# Patient Record
Sex: Female | Born: 1995 | Race: White | Hispanic: No | Marital: Single | Smoking: Never smoker
Health system: Southern US, Community
[De-identification: ages and names within clinical notes are randomized; demographics above are authoritative.]

## PROBLEM LIST (undated history)

## (undated) DIAGNOSIS — B2790 Infectious mononucleosis, unspecified without complication: Secondary | ICD-10-CM

## (undated) DIAGNOSIS — K353 Acute appendicitis with localized peritonitis, without perforation or gangrene: Secondary | ICD-10-CM

## (undated) DIAGNOSIS — Z8619 Personal history of other infectious and parasitic diseases: Secondary | ICD-10-CM

## (undated) DIAGNOSIS — J351 Hypertrophy of tonsils: Secondary | ICD-10-CM

## (undated) DIAGNOSIS — F988 Other specified behavioral and emotional disorders with onset usually occurring in childhood and adolescence: Secondary | ICD-10-CM

## (undated) DIAGNOSIS — N926 Irregular menstruation, unspecified: Secondary | ICD-10-CM

## (undated) DIAGNOSIS — Z8489 Family history of other specified conditions: Secondary | ICD-10-CM

## (undated) DIAGNOSIS — M2140 Flat foot [pes planus] (acquired), unspecified foot: Secondary | ICD-10-CM

## (undated) DIAGNOSIS — S8990XA Unspecified injury of unspecified lower leg, initial encounter: Secondary | ICD-10-CM

## (undated) DIAGNOSIS — H5210 Myopia, unspecified eye: Secondary | ICD-10-CM

## (undated) HISTORY — PX: APPENDECTOMY: SHX54

## (undated) HISTORY — DX: Hypertrophy of tonsils: J35.1

## (undated) HISTORY — DX: Myopia, unspecified eye: H52.10

## (undated) HISTORY — DX: Infectious mononucleosis, unspecified without complication: B27.90

## (undated) HISTORY — PX: MYRINGOTOMY: SHX2060

## (undated) HISTORY — DX: Other specified behavioral and emotional disorders with onset usually occurring in childhood and adolescence: F98.8

## (undated) HISTORY — DX: Acute appendicitis with localized peritonitis, without perforation or gangrene: K35.30

## (undated) HISTORY — DX: Flat foot (pes planus) (acquired), unspecified foot: M21.40

## (undated) HISTORY — DX: Irregular menstruation, unspecified: N92.6

## (undated) HISTORY — DX: Personal history of other infectious and parasitic diseases: Z86.19

## (undated) HISTORY — DX: Unspecified injury of unspecified lower leg, initial encounter: S89.90XA

---
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
## 2017-01-08 DIAGNOSIS — E860 Dehydration: Secondary | ICD-10-CM | POA: Diagnosis not present

## 2017-01-08 DIAGNOSIS — R05 Cough: Secondary | ICD-10-CM | POA: Diagnosis not present

## 2017-01-28 ENCOUNTER — Telehealth: Payer: Self-pay | Admitting: Family Medicine

## 2017-01-28 MED ORDER — DROSPIRENONE-ETHINYL ESTRADIOL 3-0.02 MG PO TABS: 1.0000 | ORAL_TABLET | Freq: Every day | ORAL | 0 refills | Status: DC

## 2017-01-28 NOTE — Telephone Encounter (Signed)
Spoke with patient Father, requesting a refill of the patient's Yaz.  Requests this be sent to [facility] in [place] (P#) [phone] Requests 90 day supply. This has been refilled as requested.  Nothing further needed.

## 2017-02-04 ENCOUNTER — Ambulatory Visit (INDEPENDENT_AMBULATORY_CARE_PROVIDER_SITE_OTHER): Payer: 59 | Admitting: Family Medicine

## 2017-02-04 DIAGNOSIS — J020 Streptococcal pharyngitis: Secondary | ICD-10-CM

## 2017-02-04 MED ORDER — METHYLPREDNISOLONE ACETATE 80 MG/ML IJ SUSP
80.0000 mg | Freq: Once | INTRAMUSCULAR | Status: AC
  Administered 2017-02-04: 80 mg via INTRAMUSCULAR

## 2017-02-04 MED ORDER — CEFDINIR 300 MG PO CAPS: 300.0000 mg | ORAL_CAPSULE | Freq: Two times a day (BID) | ORAL | 0 refills | Status: AC

## 2017-02-04 MED ORDER — ONDANSETRON HCL 4 MG PO TABS: 4.0000 mg | ORAL_TABLET | Freq: Three times a day (TID) | ORAL | 0 refills | Status: DC | PRN

## 2017-02-04 NOTE — Patient Instructions (Signed)
Strep Throat Strep throat is a bacterial infection of the throat. Your health care provider may call the infection tonsillitis or pharyngitis, depending on whether there is swelling in the tonsils or at the back of the throat. Strep throat is most common during the cold months of the year in children who are 5-19 years of age, but it can happen during any season in people of any age. This infection is spread from person to person (contagious) through coughing, sneezing, or close contact. What are the causes? Strep throat is caused by the bacteria called Streptococcus pyogenes. What increases the risk? This condition is more likely to develop in:  People who spend time in crowded places where the infection can spread easily.  People who have close contact with someone who has strep throat.  What are the signs or symptoms? Symptoms of this condition include:  Fever or chills.  Redness, swelling, or pain in the tonsils or throat.  Pain or difficulty when swallowing.  White or yellow spots on the tonsils or throat.  Swollen, tender glands in the neck or under the jaw.  Red rash all over the body (rare).  How is this diagnosed? This condition is diagnosed by performing a rapid strep test or by taking a swab of your throat (throat culture test). Results from a rapid strep test are usually ready in a few minutes, but throat culture test results are available after one or two days. How is this treated? This condition is treated with antibiotic medicine. Follow these instructions at home: Medicines  Take over-the-counter and prescription medicines only as told by your health care provider.  Take your antibiotic as told by your health care provider. Do not stop taking the antibiotic even if you start to feel better.  Have family members who also have a sore throat or fever tested for strep throat. They may need antibiotics if they have the strep infection. Eating and drinking  Do not  share food, drinking cups, or personal items that could cause the infection to spread to other people.  If swallowing is difficult, try eating soft foods until your sore throat feels better.  Drink enough fluid to keep your urine clear or pale yellow. General instructions  Gargle with a salt-water mixture 3-4 times per day or as needed. To make a salt-water mixture, completely dissolve -1 tsp of salt in 1 cup of warm water.  Make sure that all household members wash their hands well.  Get plenty of rest.  Stay home from school or work until you have been taking antibiotics for 24 hours.  Keep all follow-up visits as told by your health care provider. This is important. Contact a health care provider if:  The glands in your neck continue to get bigger.  You develop a rash, cough, or earache.  You cough up a thick liquid that is green, yellow-brown, or bloody.  You have pain or discomfort that does not get better with medicine.  Your problems seem to be getting worse rather than better.  You have a fever. Get help right away if:  You have new symptoms, such as vomiting, severe headache, stiff or painful neck, chest pain, or shortness of breath.  You have severe throat pain, drooling, or changes in your voice.  You have swelling of the neck, or the skin on the neck becomes red and tender.  You have signs of dehydration, such as fatigue, dry mouth, and decreased urination.  You become increasingly sleepy, or   you cannot wake up completely.  Your joints become red or painful. This information is not intended to replace advice given to you by your health care provider. Make sure you discuss any questions you have with your health care provider. Document Released: 03/01/2000 Document Revised: 11/01/2015 Document Reviewed: 06/27/2014 Elsevier Interactive Patient Education  2017 [employer]  

## 2017-02-04 NOTE — Progress Notes (Signed)
[name] is a 21 y.o. female presenting with a sore throat for 2-3 days.  Associated symptoms include:  nausea and cough.  Symptoms are constant.  Home treatment thus far includes:  rest, hydration, NSAIDS/acetaminophen and prescription meds Keflex x 3 doses.  No known sick contacts with similar symptoms.    There is a previous history of of similar symptoms.  Pt has been seen by ENT in the past.  H/o 5 episodes of strep in one yr.  Exam:  There were no vitals taken for this visit. Gen. Pleasant, well developed, well-nourished, in NAD HEENT - [place]/AT, PERRL, conjunctive clear, no scleral icterus, mild clear nasal drainage, pharynx with erythema and exudate. Lungs: no use of accessory muscles, no dullness to percussion, CTAB, no wheezes, rales or rhonchi Cardiovascular: RRR,  No r/g/m, no peripheral edema Abdomen: BS present, soft, nontender,nondistended Skin:  Warm, dry, intact, no lesions   A/P: -Depo-Medrol 80 mg/mL injection -We will send an Rx for cefdinir 300 mg twice daily times 10 days -Given handout on above -Suggest follow-up with ENT for tonsillectomy.

## 2017-02-18 ENCOUNTER — Telehealth: Payer: Self-pay | Admitting: Family Medicine

## 2017-02-18 NOTE — Telephone Encounter (Signed)
Since she has had 5 episodes of strep throat in the past year, she needs a tonsillectomy. Please refer her to see Dr. Melissa Montane urgently about this. She gets back to town from college on Dec. 14.

## 2017-02-18 NOTE — Telephone Encounter (Signed)
Pt going to see Dr Janace Hoard ENT, no referral needed.

## 2017-02-28 ENCOUNTER — Other Ambulatory Visit: Payer: Self-pay | Admitting: Adult Health

## 2017-02-28 ENCOUNTER — Telehealth: Payer: Self-pay | Admitting: Family Medicine

## 2017-02-28 MED ORDER — DROSPIRENONE-ETHINYL ESTRADIOL 3-0.02 MG PO TABS: 1.0000 | ORAL_TABLET | Freq: Every day | ORAL | 0 refills | Status: DC

## 2017-02-28 NOTE — Telephone Encounter (Signed)
Please call in 4 packs of Yaz to Lisa Hines (she takes this continuously)

## 2017-03-03 DIAGNOSIS — J039 Acute tonsillitis, unspecified: Secondary | ICD-10-CM | POA: Diagnosis not present

## 2017-03-04 DIAGNOSIS — Z113 Encounter for screening for infections with a predominantly sexual mode of transmission: Secondary | ICD-10-CM | POA: Diagnosis not present

## 2017-03-04 DIAGNOSIS — Z3041 Encounter for surveillance of contraceptive pills: Secondary | ICD-10-CM | POA: Diagnosis not present

## 2017-03-05 ENCOUNTER — Ambulatory Visit (INDEPENDENT_AMBULATORY_CARE_PROVIDER_SITE_OTHER): Payer: 59 | Admitting: *Deleted

## 2017-03-05 DIAGNOSIS — Z23 Encounter for immunization: Secondary | ICD-10-CM | POA: Diagnosis not present

## 2017-03-05 MED FILL — DROSPIR-ETH ESTRA 3/.02 MG: 3-0.02 | 84 days supply | Qty: 112 | Fill #0

## 2017-03-12 ENCOUNTER — Other Ambulatory Visit: Payer: Self-pay

## 2017-03-12 ENCOUNTER — Encounter (HOSPITAL_BASED_OUTPATIENT_CLINIC_OR_DEPARTMENT_OTHER): Payer: Self-pay | Admitting: *Deleted

## 2017-03-12 ENCOUNTER — Encounter (HOSPITAL_BASED_OUTPATIENT_CLINIC_OR_DEPARTMENT_OTHER)
Admission: RE | Admit: 2017-03-12 | Discharge: 2017-03-12 | Disposition: A | Discharge status: Home / Self Care | Payer: 59 | Source: Ambulatory Visit | Attending: Otolaryngology | Admitting: Otolaryngology

## 2017-03-12 DIAGNOSIS — J039 Acute tonsillitis, unspecified: Secondary | ICD-10-CM | POA: Insufficient documentation

## 2017-03-12 DIAGNOSIS — Z01812 Encounter for preprocedural laboratory examination: Secondary | ICD-10-CM | POA: Diagnosis not present

## 2017-03-13 LAB — POCT PREGNANCY, URINE: PREG TEST UR: NEGATIVE

## 2017-03-17 ENCOUNTER — Other Ambulatory Visit: Payer: Self-pay | Admitting: Otolaryngology

## 2017-03-19 ENCOUNTER — Other Ambulatory Visit: Payer: Self-pay

## 2017-03-19 ENCOUNTER — Encounter (HOSPITAL_BASED_OUTPATIENT_CLINIC_OR_DEPARTMENT_OTHER): Payer: Self-pay | Admitting: *Deleted

## 2017-03-19 ENCOUNTER — Ambulatory Visit (HOSPITAL_BASED_OUTPATIENT_CLINIC_OR_DEPARTMENT_OTHER)
Admission: RE | Admit: 2017-03-19 | Discharge: 2017-03-19 | Disposition: A | Discharge status: Home / Self Care | Payer: 59 | Source: Ambulatory Visit | Attending: Otolaryngology | Admitting: Otolaryngology

## 2017-03-19 ENCOUNTER — Ambulatory Visit (HOSPITAL_BASED_OUTPATIENT_CLINIC_OR_DEPARTMENT_OTHER): Payer: 59 | Admitting: Anesthesiology

## 2017-03-19 ENCOUNTER — Encounter (HOSPITAL_BASED_OUTPATIENT_CLINIC_OR_DEPARTMENT_OTHER)
Admission: RE | Disposition: A | Discharge status: Home / Self Care | Payer: Self-pay | Source: Ambulatory Visit | Attending: Otolaryngology

## 2017-03-19 DIAGNOSIS — F988 Other specified behavioral and emotional disorders with onset usually occurring in childhood and adolescence: Secondary | ICD-10-CM | POA: Diagnosis not present

## 2017-03-19 DIAGNOSIS — J351 Hypertrophy of tonsils: Secondary | ICD-10-CM | POA: Diagnosis not present

## 2017-03-19 DIAGNOSIS — Q6650 Congenital pes planus, unspecified foot: Secondary | ICD-10-CM | POA: Diagnosis not present

## 2017-03-19 DIAGNOSIS — Z880 Allergy status to penicillin: Secondary | ICD-10-CM | POA: Insufficient documentation

## 2017-03-19 DIAGNOSIS — D649 Anemia, unspecified: Secondary | ICD-10-CM | POA: Diagnosis not present

## 2017-03-19 DIAGNOSIS — Z888 Allergy status to other drugs, medicaments and biological substances status: Secondary | ICD-10-CM | POA: Insufficient documentation

## 2017-03-19 DIAGNOSIS — J039 Acute tonsillitis, unspecified: Secondary | ICD-10-CM | POA: Diagnosis not present

## 2017-03-19 DIAGNOSIS — J3501 Chronic tonsillitis: Secondary | ICD-10-CM | POA: Diagnosis not present

## 2017-03-19 DIAGNOSIS — F909 Attention-deficit hyperactivity disorder, unspecified type: Secondary | ICD-10-CM | POA: Diagnosis not present

## 2017-03-19 HISTORY — PX: TONSILLECTOMY: SHX5217

## 2017-03-19 HISTORY — DX: Family history of other specified conditions: Z84.89

## 2017-03-19 SURGERY — TONSILLECTOMY
Anesthesia: General | Site: Throat | Laterality: Bilateral

## 2017-03-19 MED ORDER — OXYCODONE HCL 5 MG PO TABS: 5.0000 mg | ORAL_TABLET | Freq: Once | ORAL | Status: AC | PRN

## 2017-03-19 MED ORDER — OXYCODONE HCL 5 MG/5ML PO SOLN: 5.0000 mg | ORAL | 0 refills | Status: DC | PRN

## 2017-03-19 MED ORDER — PROMETHAZINE HCL 25 MG/ML IJ SOLN: 6.2500 mg | INTRAMUSCULAR | Status: DC | PRN

## 2017-03-19 MED ORDER — SCOPOLAMINE 1 MG/3DAYS TD PT72
1.0000 | MEDICATED_PATCH | Freq: Once | TRANSDERMAL | Status: DC | PRN
Start: 2017-03-19 — End: 2017-03-19

## 2017-03-19 MED ORDER — PROPOFOL 10 MG/ML IV BOLUS
INTRAVENOUS | Status: DC | PRN
  Administered 2017-03-19: 200 mg via INTRAVENOUS
  Administered 2017-03-19: 50 mg via INTRAVENOUS

## 2017-03-19 MED ORDER — ONDANSETRON HCL 4 MG/2ML IJ SOLN
INTRAMUSCULAR | Status: DC | PRN
  Administered 2017-03-19: 4 mg via INTRAVENOUS

## 2017-03-19 MED ORDER — FENTANYL CITRATE (PF) 100 MCG/2ML IJ SOLN
50.0000 ug | INTRAMUSCULAR | Status: DC | PRN
  Administered 2017-03-19 (×2): 50 ug via INTRAVENOUS

## 2017-03-19 MED ORDER — ONDANSETRON HCL 4 MG/2ML IJ SOLN
INTRAMUSCULAR | Status: AC
  Filled 2017-03-19: qty 2

## 2017-03-19 MED ORDER — HYDROMORPHONE HCL 1 MG/ML IJ SOLN
INTRAMUSCULAR | Status: AC
  Filled 2017-03-19: qty 0.5

## 2017-03-19 MED ORDER — MIDAZOLAM HCL 2 MG/2ML IJ SOLN
INTRAMUSCULAR | Status: AC
  Filled 2017-03-19: qty 2

## 2017-03-19 MED ORDER — SUCCINYLCHOLINE CHLORIDE 200 MG/10ML IV SOSY
PREFILLED_SYRINGE | INTRAVENOUS | Status: AC
  Filled 2017-03-19: qty 10

## 2017-03-19 MED ORDER — MIDAZOLAM HCL 2 MG/2ML IJ SOLN
1.0000 mg | INTRAMUSCULAR | Status: DC | PRN
  Administered 2017-03-19: 2 mg via INTRAVENOUS

## 2017-03-19 MED ORDER — PROPOFOL 10 MG/ML IV BOLUS
INTRAVENOUS | Status: AC
  Filled 2017-03-19: qty 20

## 2017-03-19 MED ORDER — LIDOCAINE 2% (20 MG/ML) 5 ML SYRINGE
INTRAMUSCULAR | Status: AC
  Filled 2017-03-19: qty 5

## 2017-03-19 MED ORDER — OXYCODONE HCL 5 MG/5ML PO SOLN
ORAL | Status: AC
  Filled 2017-03-19: qty 5

## 2017-03-19 MED ORDER — HYDROMORPHONE HCL 1 MG/ML IJ SOLN
0.2500 mg | INTRAMUSCULAR | Status: DC | PRN
  Administered 2017-03-19 (×2): 0.25 mg via INTRAVENOUS

## 2017-03-19 MED ORDER — FENTANYL CITRATE (PF) 100 MCG/2ML IJ SOLN
INTRAMUSCULAR | Status: AC
  Filled 2017-03-19: qty 2

## 2017-03-19 MED ORDER — SUCCINYLCHOLINE CHLORIDE 200 MG/10ML IV SOSY
PREFILLED_SYRINGE | INTRAVENOUS | Status: DC | PRN
  Administered 2017-03-19: 100 mg via INTRAVENOUS

## 2017-03-19 MED ORDER — DEXAMETHASONE SODIUM PHOSPHATE 10 MG/ML IJ SOLN
INTRAMUSCULAR | Status: AC
  Filled 2017-03-19: qty 1

## 2017-03-19 MED ORDER — DEXAMETHASONE SODIUM PHOSPHATE 10 MG/ML IJ SOLN
INTRAMUSCULAR | Status: DC | PRN
  Administered 2017-03-19: 10 mg via INTRAVENOUS

## 2017-03-19 MED ORDER — OXYCODONE HCL 5 MG/5ML PO SOLN
5.0000 mg | Freq: Once | ORAL | Status: AC | PRN
Start: 2017-03-19 — End: 2017-03-19
  Administered 2017-03-19: 5 mg via ORAL

## 2017-03-19 MED ORDER — LACTATED RINGERS IV SOLN
INTRAVENOUS | Status: DC
  Administered 2017-03-19 (×2): via INTRAVENOUS

## 2017-03-19 MED ORDER — MEPERIDINE HCL 25 MG/ML IJ SOLN: 6.2500 mg | INTRAMUSCULAR | Status: DC | PRN

## 2017-03-19 MED FILL — oxyCODONE HCL 5 MG/5ML SOLN: 5 | 8 days supply | Qty: 240 | Fill #0

## 2017-03-19 SURGICAL SUPPLY — 32 items
CANISTER SUCT 1200ML W/VALVE (MISCELLANEOUS) ×3 IMPLANT
CATH ROBINSON RED A/P 12FR (CATHETERS) IMPLANT
CLEANER CAUTERY TIP 5X5 PAD (MISCELLANEOUS) ×1 IMPLANT
COAGULATOR SUCT 6 FR SWTCH (ELECTROSURGICAL) ×1
COAGULATOR SUCT SWTCH 10FR 6 (ELECTROSURGICAL) ×2 IMPLANT
COVER BACK TABLE 60X90IN (DRAPES) ×3 IMPLANT
COVER MAYO STAND STRL (DRAPES) ×3 IMPLANT
ELECT COATED BLADE 2.86 ST (ELECTRODE) ×3 IMPLANT
ELECT REM PT RETURN 9FT ADLT (ELECTROSURGICAL) ×3
ELECT REM PT RETURN 9FT PED (ELECTROSURGICAL)
ELECTRODE REM PT RETRN 9FT PED (ELECTROSURGICAL) IMPLANT
ELECTRODE REM PT RTRN 9FT ADLT (ELECTROSURGICAL) ×1 IMPLANT
GAUZE SPONGE 4X4 12PLY STRL LF (GAUZE/BANDAGES/DRESSINGS) ×3 IMPLANT
GLOVE BIO SURGEON STRL SZ 6.5 (GLOVE) ×2 IMPLANT
GLOVE BIO SURGEONS STRL SZ 6.5 (GLOVE) ×1
GLOVE SS BIOGEL STRL SZ 7.5 (GLOVE) ×1 IMPLANT
GLOVE SUPERSENSE BIOGEL SZ 7.5 (GLOVE) ×2
GOWN STRL REUS W/ TWL LRG LVL3 (GOWN DISPOSABLE) ×2 IMPLANT
GOWN STRL REUS W/TWL LRG LVL3 (GOWN DISPOSABLE) ×4
MARKER SKIN DUAL TIP RULER LAB (MISCELLANEOUS) IMPLANT
NS IRRIG 1000ML POUR BTL (IV SOLUTION) ×3 IMPLANT
PAD CLEANER CAUTERY TIP 5X5 (MISCELLANEOUS) ×2
PENCIL FOOT CONTROL (ELECTRODE) ×3 IMPLANT
SHEET MEDIUM DRAPE 40X70 STRL (DRAPES) ×3 IMPLANT
SPONGE TONSIL 1 RF SGL (DISPOSABLE) IMPLANT
SPONGE TONSIL 1.25 RF SGL STRG (GAUZE/BANDAGES/DRESSINGS) IMPLANT
SYR BULB 3OZ (MISCELLANEOUS) ×3 IMPLANT
TOWEL OR 17X24 6PK STRL BLUE (TOWEL DISPOSABLE) ×3 IMPLANT
TUBE CONNECTING 20'X1/4 (TUBING) ×1
TUBE CONNECTING 20X1/4 (TUBING) ×2 IMPLANT
TUBE SALEM SUMP 12R W/ARV (TUBING) IMPLANT
TUBE SALEM SUMP 16 FR W/ARV (TUBING) ×3 IMPLANT

## 2017-03-19 NOTE — Anesthesia Preprocedure Evaluation (Signed)
Anesthesia Evaluation  Patient identified by MRN, date of birth, ID band Patient awake    Reviewed: Allergy & Precautions, NPO status , Patient's Chart, lab work & pertinent test results  Airway Mallampati: I  TM Distance: >3 FB Neck ROM: Full    Dental   Pulmonary neg pulmonary ROS,    Pulmonary exam normal        Cardiovascular negative cardio ROS Normal cardiovascular exam Rhythm:Regular     Neuro/Psych negative neurological ROS  negative psych ROS   GI/Hepatic negative GI ROS, Neg liver ROS,   Endo/Other  negative endocrine ROS  Renal/GU negative Renal ROS     Musculoskeletal negative musculoskeletal ROS (+)   Abdominal   Peds  Hematology  (+) anemia ,   Anesthesia Other Findings   Reproductive/Obstetrics negative OB ROS                             Anesthesia Physical  Anesthesia Plan  ASA: I  Anesthesia Plan: General   Post-op Pain Management:    Induction: Intravenous  PONV Risk Score and Plan: 4 or greater and Ondansetron, Dexamethasone, Scopolamine patch - Pre-op and Midazolam  Airway Management Planned: Oral ETT  Additional Equipment:   Intra-op Plan:   Post-operative Plan: Extubation in OR  Informed Consent: I have reviewed the patients History and Physical, chart, labs and discussed the procedure including the risks, benefits and alternatives for the proposed anesthesia with the patient or authorized representative who has indicated his/her understanding and acceptance.   Dental advisory given  Plan Discussed with: CRNA  Anesthesia Plan Comments:         Anesthesia Quick Evaluation

## 2017-03-19 NOTE — Discharge Instructions (Signed)

## 2017-03-19 NOTE — Transfer of Care (Signed)
Immediate Anesthesia Transfer of Care Note  Patient: SURIYAH VERGARA  Procedure(s) Performed: TONSILLECTOMY (Bilateral Throat)  Patient Location: PACU  Anesthesia Type:General  Level of Consciousness: awake and confused  Airway & Oxygen Therapy: Patient Spontanous Breathing and aerosol face mask  Post-op Assessment: Report given to RN and Post -op Vital signs reviewed and stable  Post vital signs: Reviewed and stable  Last Vitals:  Vitals:   03/19/17 0821 03/19/17 0937  BP: 120/74   Pulse: 84   Resp: 18   Temp: 36.7 C 36.7 C  SpO2: 99%     Last Pain:  Vitals:   03/19/17 0821  TempSrc: Oral      Patients Stated Pain Goal: 0 (43/[id number]/57 [id number])  Complications: No apparent anesthesia complications

## 2017-03-19 NOTE — Anesthesia Procedure Notes (Signed)
Procedure Name: Intubation Date/Time: 03/19/2017 8:59 AM Performed by: Lyndee Leo, CRNA Pre-anesthesia Checklist: Patient identified, Emergency Drugs available, Suction available and Patient being monitored Patient Re-evaluated:Patient Re-evaluated prior to induction Oxygen Delivery Method: Circle system utilized Preoxygenation: Pre-oxygenation with 100% oxygen Induction Type: IV induction Ventilation: Mask ventilation without difficulty Laryngoscope Size: Mac and 3 Grade View: Grade I Tube type: Oral Tube size: 6.5 mm Number of attempts: 1 Airway Equipment and Method: Stylet and Oral airway Placement Confirmation: ETT inserted through vocal cords under direct vision,  positive ETCO2 and breath sounds checked- equal and bilateral Tube secured with: Tape Dental Injury: Teeth and Oropharynx as per pre-operative assessment

## 2017-03-19 NOTE — Op Note (Signed)
Preop/postop diagnosis: Chronic tonsillitis Procedure: Tonsillectomy Anesthesia: Gen. Estimated blood loss: Less than 5 mL Indications: 22 year old with repetitive persistent tonsillitis issues it's been refractory to medical therapy. She is informed a risk and benefits of the procedure and options were discussed all questions are answered and consent was obtained. Procedure: Patient taken the upper and placed supine position after general endotracheal tube anesthesia was placed in the Rose position draped in usual sterile manner. The Crowe-Davis mouth gag was inserted retracted and suspended from the [place] stand. A left tonsil begun making a left anterior tonsillar pillar incision identifying the capsule of the tonsil and removing it with electrocautery dissection along its capsule. Right tonsil removed in the same fashion. The tonsils were moderate size. The Crowe-Davis was released and resuspended and there was hemostasis present in all locations after a few areas of suction cautery. The hypopharynx esophagus stomach were suctioned NG tube. There was good hemostasis. The patient was awakened brought to recovery in stable condition counts correct

## 2017-03-19 NOTE — Anesthesia Postprocedure Evaluation (Signed)
Anesthesia Post Note  Patient: Lisa Hines  Procedure(s) Performed: TONSILLECTOMY (Bilateral Throat)     Patient location during evaluation: PACU Anesthesia Type: General Level of consciousness: sedated and patient cooperative Pain management: pain level controlled Vital Signs Assessment: post-procedure vital signs reviewed and stable Respiratory status: spontaneous breathing Cardiovascular status: stable Anesthetic complications: no    Last Vitals:  Vitals:   03/19/17 0949 03/19/17 1000  BP: 128/80 133/76  Pulse: 86 79  Resp: 11 10  Temp:    SpO2: 100% 100%    Last Pain:  Vitals:   03/19/17 1036  TempSrc:   PainSc: [place]

## 2017-03-19 NOTE — H&P (Signed)
Lisa Hines is an 22 y.o. female.   Chief Complaint: tonsil infection HPI: hx of recurrent tonsillitis and refractory to medical therapy  Past Medical History:  Diagnosis Date  . Acute appendicitis with localized peritonitis 10/28/2014  . ADD (attention deficit disorder)   . Family history of adverse reaction to anesthesia    unknown---pt. is adopted  . History of Clostridium difficile colitis   . Infectious mononucleosis 01/25/2014   no obvious complication except she complains of severe pain in her throat not responsive to ibuprofen discussed  options pain medicines steroid risk benefit   . Irregular menses 11/08/2010  . Knee injury 04/19/2011   Poss traumatic bursitis tendinitis knee looks stable  .   Marland Kitchen Pes planus    consult [facility]  . Tonsillar hypertrophy     Past Surgical History:  Procedure Laterality Date  . APPENDECTOMY    . LAPAROSCOPIC APPENDECTOMY N/A 10/28/2014   Procedure: APPENDECTOMY LAPAROSCOPIC;  Surgeon: Excell Seltzer, MD;  Location: [facility];  Service: General;  Laterality: N/A;  . MYRINGOTOMY      Family History  Adopted: Yes  Problem Relation Age of Onset  . ADD / ADHD Brother        possible ld   Social History:  reports that  has never smoked. she has never used smokeless tobacco. She reports that she drinks alcohol. She reports that she does not use drugs.  Allergies:  Allergies  Allergen Reactions  . Clindamycin Other (See Comments)    cdiff colitis  . Amoxicillin-Pot Clavulanate Rash  . Penicillin G Rash    No medications prior to admission.    No results found for this or any previous visit (from the past 48 hour(s)). No results found.  Review of Systems  Constitutional: Negative.   HENT: Negative.   Eyes: Negative.   Respiratory: Negative.   Cardiovascular: Negative.   Skin: Negative.     Height 5\' 6"  (1.676 m), weight 68.5 kg (151 lb), last menstrual period 03/05/2017. Physical Exam  Constitutional: She appears well-developed  and well-nourished.  HENT:  Head: Normocephalic and atraumatic.  Nose: Nose normal.  Eyes: Conjunctivae are normal. Pupils are equal, round, and reactive to light.  Neck: Normal range of motion. Neck supple.  Cardiovascular: Normal rate.  Respiratory: Effort normal.  GI: Soft.  Musculoskeletal: Normal range of motion.     Assessment/Plan Chronic tonsillitis- discussed tonsillectomy and ready to proceed  Lisa Montane, MD 03/19/2017, 8:07 AM

## 2017-03-20 ENCOUNTER — Encounter (HOSPITAL_BASED_OUTPATIENT_CLINIC_OR_DEPARTMENT_OTHER): Payer: Self-pay | Admitting: Otolaryngology

## 2017-03-27 DIAGNOSIS — Z3202 Encounter for pregnancy test, result negative: Secondary | ICD-10-CM | POA: Diagnosis not present

## 2017-03-27 DIAGNOSIS — Z30430 Encounter for insertion of intrauterine contraceptive device: Secondary | ICD-10-CM | POA: Diagnosis not present

## 2017-04-03 IMAGING — CT CT ABD-PELV W/ CM
2 of 4 series · 16 of 46 positions shown, 18 images · IV contrast (omnipaque)
Comparison: None.

CLINICAL DATA: Acute right-sided abdominal pain.

EXAM:
CT ABDOMEN AND PELVIS WITH CONTRAST
TECHNIQUE: Multidetector CT imaging of the abdomen and pelvis was performed
using the standard protocol following bolus administration of
intravenous contrast.
CONTRAST:  25mL OMNIPAQUE IOHEXOL 300 MG/ML SOLN, 100mL OMNIPAQUE
IOHEXOL 300 MG/ML SOLN

[Series 2: abd/pel with · axial · 0.74mm/px · z∈[+913,+1318]mm · 13 of 89 slices shown, 15 images]
[im 4/89  soft-tissue]
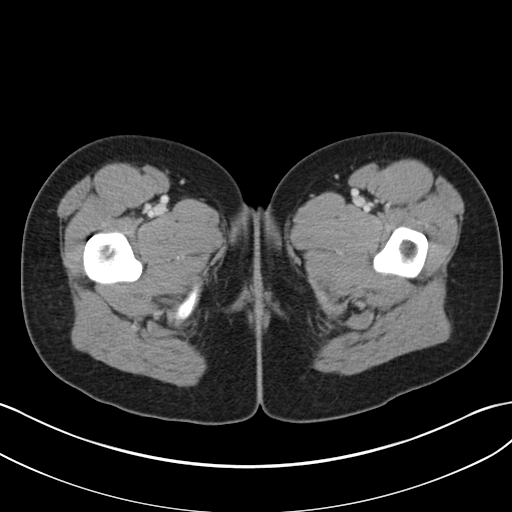
[im 4/89  bone]
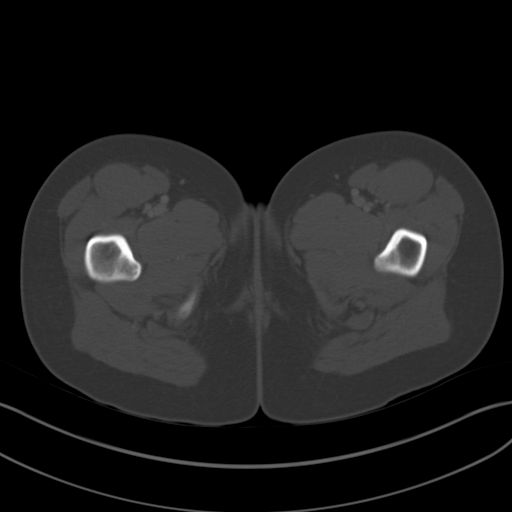
[im 11/89  soft-tissue]
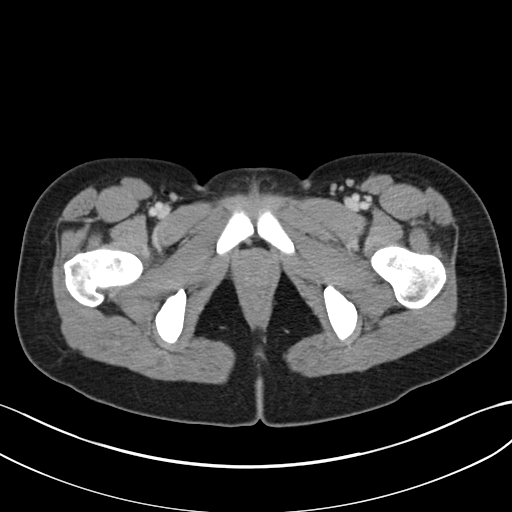
[im 18/89  soft-tissue]
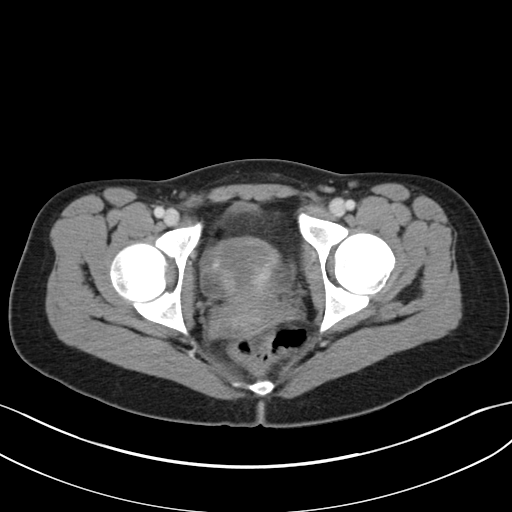
[im 25/89  soft-tissue]
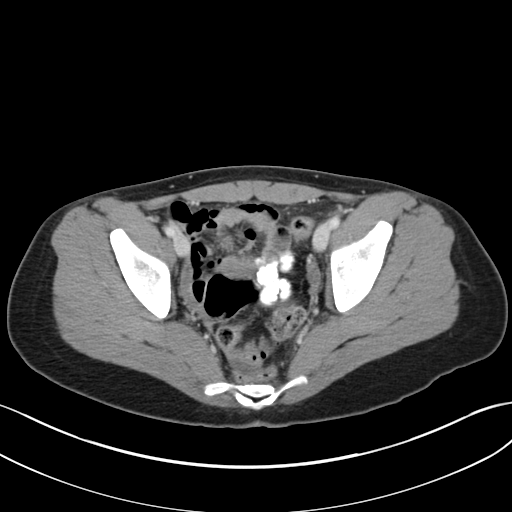
[im 32/89  soft-tissue]
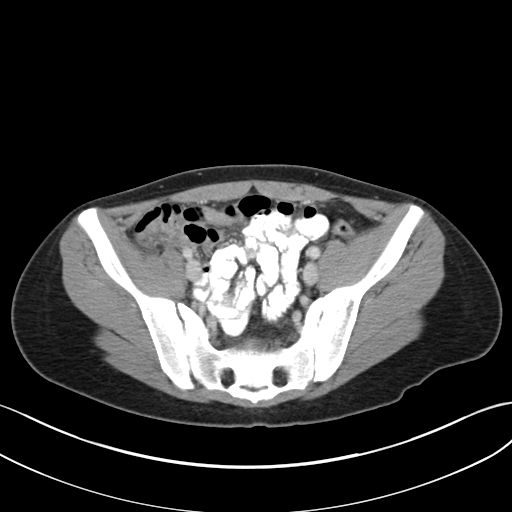
[im 39/89  soft-tissue]
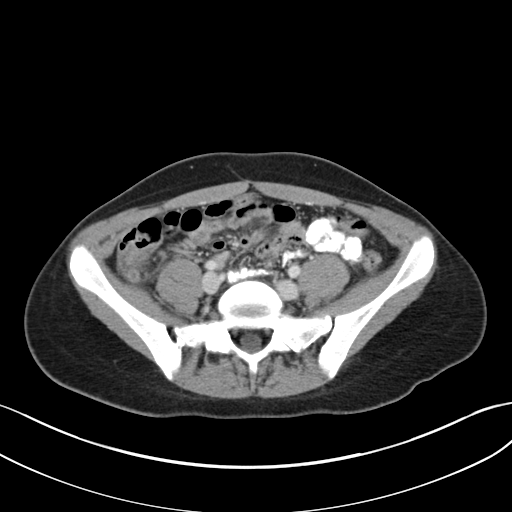
[im 46/89  soft-tissue]
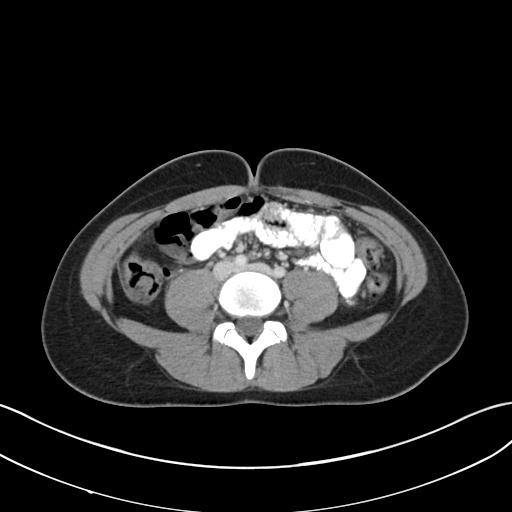
[im 50/89  soft-tissue]
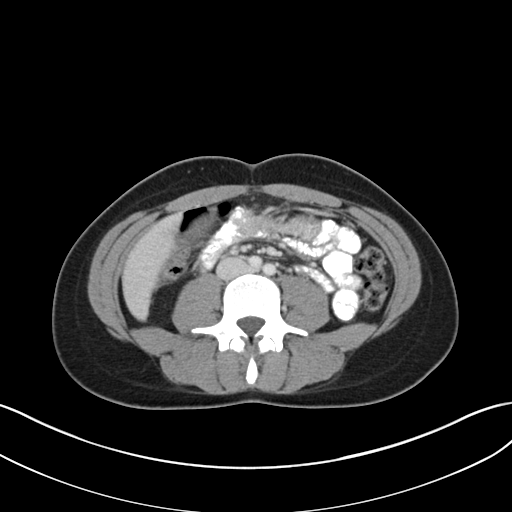
[im 57/89  soft-tissue]
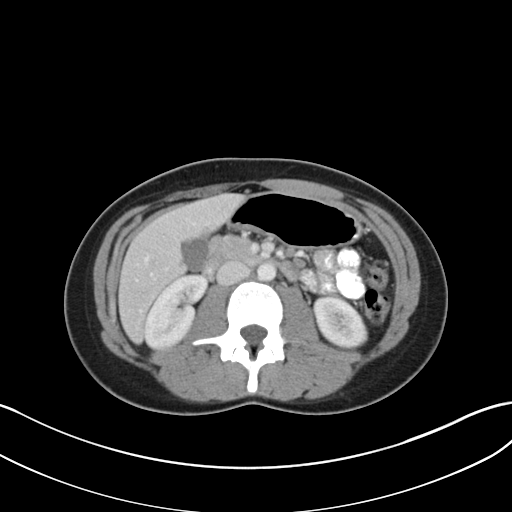
[im 57/89  bone]
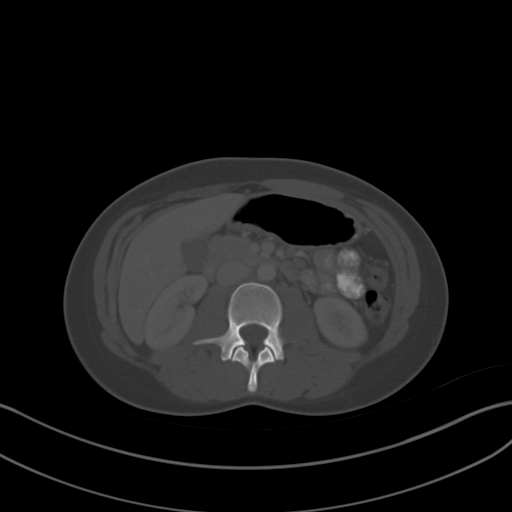
[im 64/89  soft-tissue]
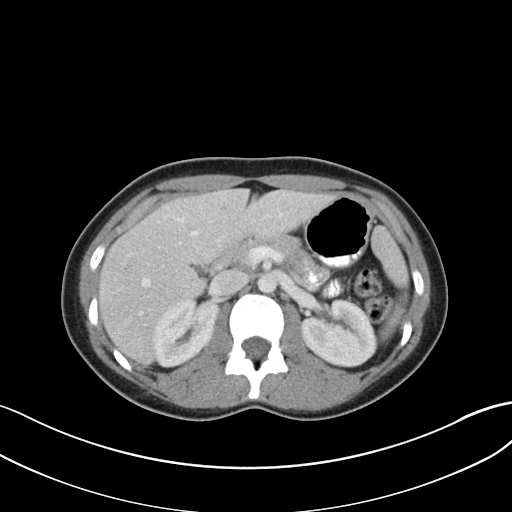
[im 71/89  soft-tissue]
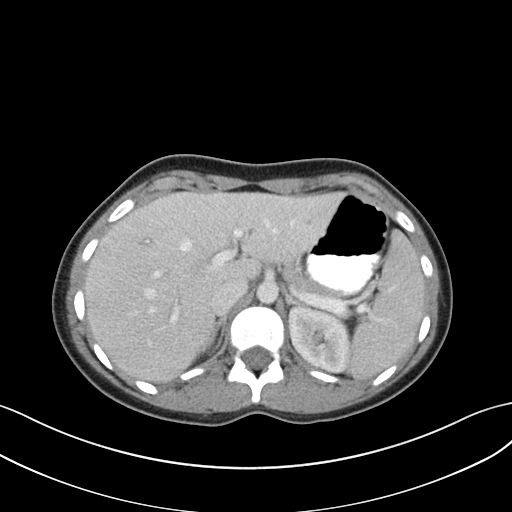
[im 78/89  soft-tissue]
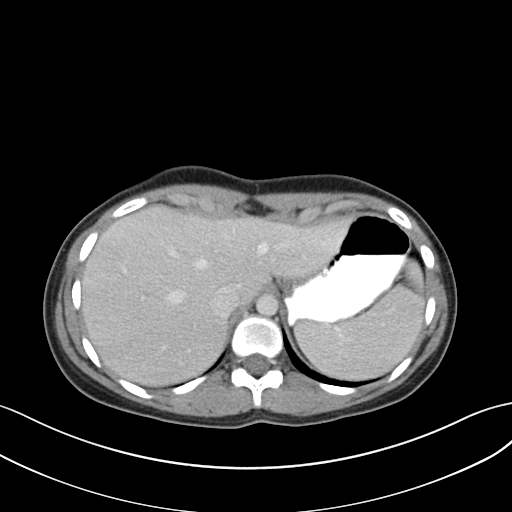
[im 85/89  soft-tissue]
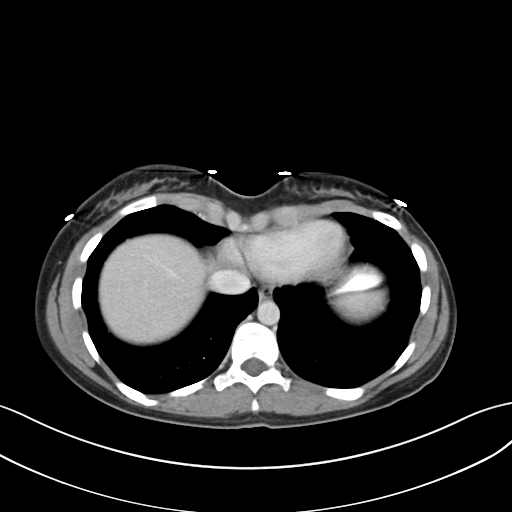

[Series 3: coronal a/|p · coronal · 0.58mm/px · 3 of 73 slices shown]
[im 25/73  soft-tissue]
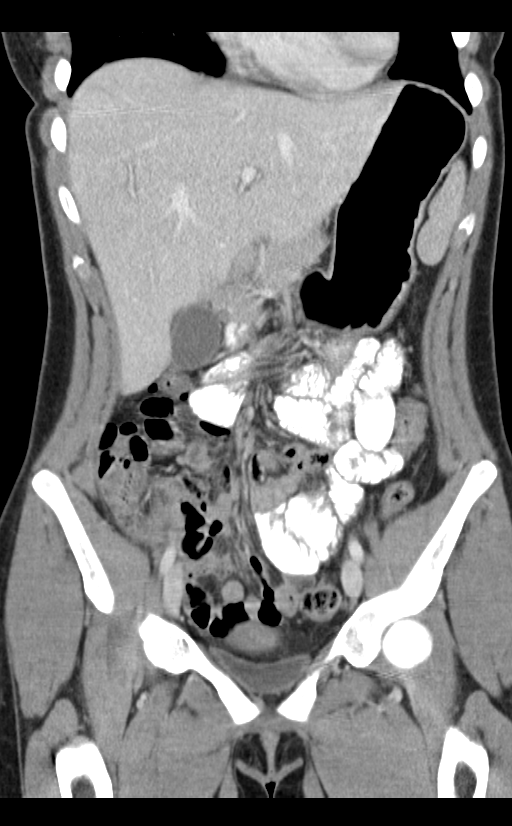
[im 33/73  soft-tissue]
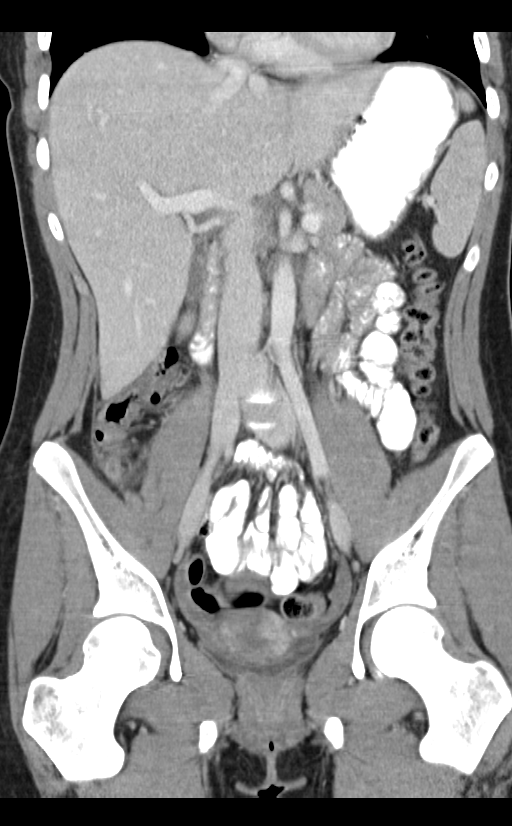
[im 41/73  soft-tissue]
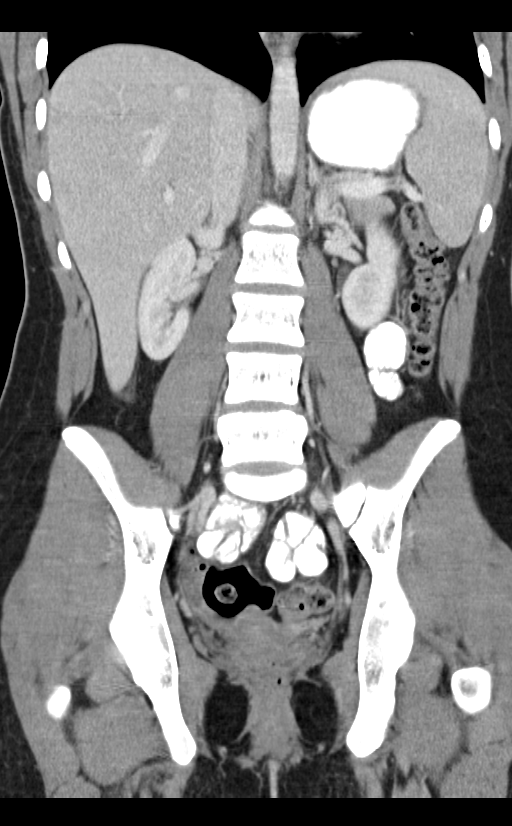

[16 of 46 positions shown; findings below may reference images not displayed]

FINDINGS: Visualized lung bases appear normal. No significant osseous
abnormality is noted.

No gallstones are noted. No focal abnormality seen involving the
liver, spleen or pancreas. Adrenal glands and kidneys appear normal.
No hydronephrosis or renal obstruction is noted. The appendix is
enlarged with surrounding inflammation consistent with acute
appendicitis. It is retrocecal in position. No abscess or abnormal
fluid collection is noted. There is no evidence of bowel
obstruction. Urinary bladder is decompressed. Uterus and ovaries
appear normal. No significant adenopathy is noted.
IMPRESSION: Findings consistent with acute appendicitis without abscess
formation. Appendix is retrocecal in position.

## 2017-04-21 ENCOUNTER — Telehealth: Payer: Self-pay | Admitting: Family Medicine

## 2017-04-21 MED ORDER — LISDEXAMFETAMINE DIMESYLATE 40 MG PO CAPS: 40.0000 mg | ORAL_CAPSULE | ORAL | 0 refills | Status: DC

## 2017-04-21 NOTE — Telephone Encounter (Signed)
Please send in a refill of Vyvanse to the [facility] on [address] in [place]

## 2017-04-21 NOTE — Telephone Encounter (Signed)
Done electronically.

## 2017-04-24 ENCOUNTER — Telehealth: Payer: Self-pay | Admitting: Family Medicine

## 2017-04-24 MED ORDER — LISDEXAMFETAMINE DIMESYLATE 40 MG PO CAPS: 40.0000 mg | ORAL_CAPSULE | ORAL | 0 refills | Status: DC

## 2017-04-24 MED FILL — VYVANSE 40 MG CAPSULE: 40 | 90 days supply | Qty: 90 | Fill #0

## 2017-04-24 NOTE — Telephone Encounter (Signed)
Sent to  [facility] .   Please contact  [facility] and cancel that rx  ( cause  of cost)

## 2017-04-24 NOTE — Telephone Encounter (Signed)
This has been taken care of.  [facility].  Will send to Dr Sarajane Jews as Juluis Rainier.  Nothing further needed.

## 2017-04-24 NOTE — Telephone Encounter (Signed)
Please cancel the [facility]vanse order to the [facility] in [place] and instead send in a rx for #90 to [facility]

## 2017-06-02 DIAGNOSIS — Z30431 Encounter for routine checking of intrauterine contraceptive device: Secondary | ICD-10-CM | POA: Diagnosis not present

## 2017-06-02 DIAGNOSIS — B373 Candidiasis of vulva and vagina: Secondary | ICD-10-CM | POA: Diagnosis not present

## 2017-06-02 DIAGNOSIS — Z202 Contact with and (suspected) exposure to infections with a predominantly sexual mode of transmission: Secondary | ICD-10-CM | POA: Diagnosis not present

## 2017-06-02 DIAGNOSIS — N939 Abnormal uterine and vaginal bleeding, unspecified: Secondary | ICD-10-CM | POA: Diagnosis not present

## 2017-06-03 DIAGNOSIS — Z202 Contact with and (suspected) exposure to infections with a predominantly sexual mode of transmission: Secondary | ICD-10-CM | POA: Diagnosis not present

## 2017-06-11 DIAGNOSIS — N710 Acute inflammatory disease of uterus: Secondary | ICD-10-CM | POA: Diagnosis not present

## 2017-06-11 DIAGNOSIS — R102 Pelvic and perineal pain: Secondary | ICD-10-CM | POA: Diagnosis not present

## 2017-06-11 DIAGNOSIS — A5602 Chlamydial vulvovaginitis: Secondary | ICD-10-CM | POA: Diagnosis not present

## 2017-06-16 DIAGNOSIS — Z30431 Encounter for routine checking of intrauterine contraceptive device: Secondary | ICD-10-CM | POA: Diagnosis not present

## 2017-06-19 DIAGNOSIS — F330 Major depressive disorder, recurrent, mild: Secondary | ICD-10-CM | POA: Diagnosis not present

## 2017-06-19 DIAGNOSIS — F419 Anxiety disorder, unspecified: Secondary | ICD-10-CM | POA: Diagnosis not present

## 2017-06-19 DIAGNOSIS — F509 Eating disorder, unspecified: Secondary | ICD-10-CM | POA: Diagnosis not present

## 2017-06-23 DIAGNOSIS — Z30431 Encounter for routine checking of intrauterine contraceptive device: Secondary | ICD-10-CM | POA: Diagnosis not present

## 2017-06-23 DIAGNOSIS — B373 Candidiasis of vulva and vagina: Secondary | ICD-10-CM | POA: Diagnosis not present

## 2017-07-07 ENCOUNTER — Telehealth: Payer: Self-pay | Admitting: Family Medicine

## 2017-07-07 MED ORDER — FLUCONAZOLE 150 MG PO TABS: 150.0000 mg | ORAL_TABLET | Freq: Once | ORAL | 0 refills | Status: AC

## 2017-07-07 NOTE — Telephone Encounter (Signed)
Ok to rx 2 diflucan and please have her contact her ob gyne who saw her  2 week  go  Would be best for them  To take over the routine  Y[place] treatment  .

## 2017-07-07 NOTE — Telephone Encounter (Signed)
ATC patient, mailbox full.  Diflucan sent to pharmacy for #2 Made Patient's father aware that meds were called and rec's below - he is who relayed the concerns this morning regarding symptoms.

## 2017-07-07 NOTE — Telephone Encounter (Signed)
Symptoms onset x 2-3 days

## 2017-07-07 NOTE — Telephone Encounter (Signed)
She has a slight whitish vaginal DC with itching. Please call in Diflucan tablets (5) to the [facility] [address] in [place]

## 2017-08-20 DIAGNOSIS — Z113 Encounter for screening for infections with a predominantly sexual mode of transmission: Secondary | ICD-10-CM | POA: Diagnosis not present

## 2017-08-25 DIAGNOSIS — A749 Chlamydial infection, unspecified: Secondary | ICD-10-CM | POA: Diagnosis not present

## 2017-08-25 DIAGNOSIS — R300 Dysuria: Secondary | ICD-10-CM | POA: Diagnosis not present

## 2017-08-25 DIAGNOSIS — B373 Candidiasis of vulva and vagina: Secondary | ICD-10-CM | POA: Diagnosis not present

## 2017-09-10 ENCOUNTER — Telehealth: Payer: Self-pay | Admitting: Family Medicine

## 2017-09-10 NOTE — Telephone Encounter (Signed)
Opened in error

## 2017-09-11 ENCOUNTER — Encounter: Payer: Self-pay | Admitting: *Deleted

## 2017-09-11 DIAGNOSIS — H5213 Myopia, bilateral: Secondary | ICD-10-CM | POA: Diagnosis not present

## 2017-09-11 NOTE — Progress Notes (Signed)
No chief complaint on file.   HPI: Lisa Hines 22 y.o. come in for weight gain and concern bout meds for depression   Was place on   lexapro  10 mg   Only month  Concern about weight gain  From this .   Helped depression  And forgot to take daily  Middle may began and skipped a week and then  Ended  10  Days.  Depression and mood.  Irritable recnetly no suicidal but urge for self mutilating but not taking action  Sleep hard to  Get up in am.  Up at 10 and.  dont want to go out with friends .   past semester after x mas break.   Was living on campus  And  Drove every where    Instead of walking    Feet hurt .  Grades ok .  Ex bf passed away  And felt worse.     Pressure  From  gps and mom about grades  Net tobacco rd etoh about 3 per night wine or cocktails with friends  Weight gain was mostly in fall   Till now  ROS: See pertinent positives and negatives per HPI.  Past Medical History:  Diagnosis Date  . Acute appendicitis with localized peritonitis 10/28/2014  . ADD (attention deficit disorder)   . Family history of adverse reaction to anesthesia    unknown---pt. is adopted  . History of Clostridium difficile colitis   . Infectious mononucleosis 01/25/2014   no obvious complication except she complains of severe pain in her throat not responsive to ibuprofen discussed  options pain medicines steroid risk benefit   . Irregular menses 11/08/2010  . Knee injury 04/19/2011   Poss traumatic bursitis tendinitis knee looks stable  .   Marland Kitchen Myopia   . Pes planus    consult [facility]  . Tonsillar hypertrophy     Family History  Adopted: Yes  Problem Relation Age of Onset  . ADD / ADHD Brother        possible ld    Social History   Socioeconomic History  . Marital status: Single    Spouse name: Not on file  . Number of children: Not on file  . Years of education: Not on file  . Highest education level: Not on file  Occupational History  . Not on file  Social Needs  . Financial  resource strain: Not on file  . Food insecurity:    Worry: Not on file    Inability: Not on file  . Transportation needs:    Medical: Not on file    Non-medical: Not on file  Tobacco Use  . Smoking status: Never Smoker  . Smokeless tobacco: Never Used  Substance and Sexual Activity  . Alcohol use: Yes    Alcohol/week: 0.0 oz    Comment: ocassionally   . Drug use: No  . Sexual activity: Not on file  Lifestyle  . Physical activity:    Days per week: Not on file    Minutes per session: Not on file  . Stress: Not on file  Relationships  . Social connections:    Talks on phone: Not on file    Gets together: Not on file    Attends religious service: Not on file    Active member of club or organization: Not on file    Attends meetings of clubs or organizations: Not on file    Relationship status: Not on file  Other Topics Concern  . Not on file  Social History Narrative   Caretaker verifies today that the child's current immunizations are up to date.   Child is adopted   Sib adopted   Active at school gymnastics, swimming, tennis and horseback riding seasonal now volleyball   Sleep about 9 hours or so   AL program   [employer]   ib courses   Field hockey diving and lacrosse in past    Intact family       Outpatient Medications Prior to Visit  Medication Sig Dispense Refill  . Levonorgestrel (KYLEENA IU) by Intrauterine route.    Marland Kitchen lisdexamfetamine (VYVANSE) 40 MG capsule Take 1 capsule (40 mg total) by mouth every morning. 90 capsule 0  . calcium carbonate (TUMS - DOSED IN MG ELEMENTAL CALCIUM) 500 MG chewable tablet Chew 1 tablet by mouth daily as needed for indigestion or heartburn.    . drospirenone-ethinyl estradiol (YAZ,GIANVI,LORYNA) 3-0.02 MG tablet Take 1 tablet by mouth daily. TAKE CONTINUOUSLY 4 Package 0  . oxyCODONE (ROXICODONE) 5 MG/5ML solution Take 5 mLs (5 mg total) by mouth every 4 (four) hours as needed for severe pain. 240 mL 0   No facility-administered  medications prior to visit.      EXAM:  Temp 97.6 F (36.4 C) (Oral)   Ht 5\' 6"  (1.676 m)   Wt 177 lb 9.6 oz (80.6 kg)   BMI 28.67 kg/m   Body mass index is 28.67 kg/m.  GENERAL: vitals reviewed and listed above, alert, oriented, appears well hydrated and in no acute distress HEENT: atraumatic, conjunctiva  clear, no obvious abnormalities on inspection of external nose and ears  NECK: no obvious masses on inspection palpation  LUNGS: clear to auscultation bilaterally, no wheezes, rales or rhonchi, good air movement CV: HRRR, no clubbing cyanosis or  peripheral edema nl cap refill  MS: moves all extremities without noticeable focal  abnormality PSYCH: pleasant and cooperative, no obvious depression or anxiety  BP Readings from Last 3 Encounters:  03/19/17 121/89  10/02/16 102/80  09/05/16 90/70   Wt Readings from Last 3 Encounters:  09/12/17 177 lb 9.6 oz (80.6 kg)  03/19/17 167 lb 3.2 oz (75.8 kg) (91 %, Z= 1.32)*  09/05/16 151 lb (68.5 kg) (82 %, Z= 0.92)*   * Growth percentiles are based on CDC (Girls, 2-20 Years) data.    ASSESSMENT AND PLAN:  Discussed the following assessment and plan:  Adolescent depression - since off  med for over 10 days can try wellbutrini   at this time will have counselor support when back on campus  Medication management  Weight gain - prob minimal from  med but could aggravate   Encounter for counseling 26 #  Weight gain in last year   Felt to be environmental and  Med?   Counseled. lsi plans  Disc   -Patient advised to return or notify health care team  if  new concerns arise. Total visit 64mins > 50% spent counseling and coordinating care as indicated in above note and in instructions to patient .      Patient Instructions  Begin  New medication  For depression   Goals  As we discussed .   Plan ROV in 4 week. Or when fits in schedule  .     Complicated Grieving Grief is a normal response to the death of someone close  to you. Feelings of fear, anger, and guilt can affect almost everyone who loses a  loved one. It is also common to have symptoms of depression while you are grieving. These include problems with sleep, loss of appetite, and lack of energy. They may last for weeks or months after a loss. Complicated grief is different from normal grief or depression. Normal grieving involves sadness and feelings of loss, but these feelings are not constant. Complicated grief is a constant and severe type of grief. It interferes with your ability to function normally. It may last for several months to a year or longer. Complicated grief may require treatment from a mental health care provider. What are the causes? It is not known why some people continue to struggle with grief and others do not. You may be at higher risk for complicated grief if:  The death of your loved one was sudden or unexpected.  The death of your loved one was due to a violent event.  Your loved one committed suicide.  Your loved one was a child or a young person.  You were very close to or dependent on the loved one.  You have a history of depression.  What are the signs or symptoms? Signs and symptoms of complicated grief may include:  Feeling disbelief or numbness.  Being unable to enjoy good memories of your loved one.  Needing to avoid anything that reminds you of your loved one.  Being unable to stop thinking about the death.  Feeling intense anger or guilt.  Feeling alone and hopeless.  Feeling that your life is meaningless and empty.  Losing the desire to live.  How is this diagnosed? Your health care provider may diagnose complicated grief if:  You have constant symptoms of grief for 6-12 months or longer.  Your symptoms are interfering with your ability to live your life.  Your health care provider may want you to see a mental health care provider. Many symptoms of depression are similar to the symptoms of  complicated grief. It is important to be evaluated for complicated grief along with other mental health conditions. How is this treated? Talk therapy with a mental health provider is the most common treatment for complicated grief. During therapy, you will learn healthy ways to cope with the loss of your loved one. In some cases, your mental health care provider may also recommend antidepressant medicines. Follow these instructions at home:  Take care of yourself. ? Eat regular meals and maintain a healthy diet. Eat plenty of fruits, vegetables, and whole grains. ? Try to get some exercise each day. ? Keep regular hours for sleep. Try to get at least 8 hours of sleep each night.  Do not use drugs or alcohol to ease your symptoms.  Take medicines only as directed by your health care provider.  Spend time with friends and loved ones.  Consider joining a grief (bereavement) support group to help you deal with your loss.  Keep all follow-up visits as directed by your health care provider. This is important. Contact a health care provider if:  Your symptoms keep you from functioning normally.  Your symptoms do not get better with treatment. Get help right away if:  You have serious thoughts of hurting yourself or someone else.  You have suicidal feelings. This information is not intended to replace advice given to you by your health care provider. Make sure you discuss any questions you have with your health care provider. Document Released: 03/04/2005 Document Revised: 08/10/2015 Document Reviewed: 08/12/2013 Elsevier Interactive Patient Education  [employer].  Standley Brooking. Emrie Gayle M.D.

## 2017-09-12 ENCOUNTER — Encounter: Payer: Self-pay | Admitting: Internal Medicine

## 2017-09-12 ENCOUNTER — Ambulatory Visit (INDEPENDENT_AMBULATORY_CARE_PROVIDER_SITE_OTHER): Payer: 59 | Admitting: Internal Medicine

## 2017-09-12 VITALS — BP 102/60 | Temp 97.6°F | Ht 66.0 in | Wt 177.6 lb

## 2017-09-12 DIAGNOSIS — R635 Abnormal weight gain: Secondary | ICD-10-CM

## 2017-09-12 DIAGNOSIS — Z719 Counseling, unspecified: Secondary | ICD-10-CM

## 2017-09-12 DIAGNOSIS — F329 Major depressive disorder, single episode, unspecified: Secondary | ICD-10-CM | POA: Diagnosis not present

## 2017-09-12 DIAGNOSIS — F32A Depression, unspecified: Secondary | ICD-10-CM

## 2017-09-12 DIAGNOSIS — Z79899 Other long term (current) drug therapy: Secondary | ICD-10-CM

## 2017-09-12 MED ORDER — BUPROPION HCL ER (XL) 150 MG PO TB24
150.0000 mg | ORAL_TABLET | Freq: Every day | ORAL | 1 refills | Status: DC
Start: 2017-09-12 — End: 2018-03-24

## 2017-09-12 MED FILL — BUPROPION HCL XL 150 MG TAB: 150 | 30 days supply | Qty: 60 | Fill #0

## 2017-09-12 NOTE — Patient Instructions (Signed)
Begin  New medication  For depression   Goals  As we discussed .   Plan ROV in 4 week. Or when fits in schedule  .     Complicated Grieving Grief is a normal response to the death of someone close to you. Feelings of fear, anger, and guilt can affect almost everyone who loses a loved one. It is also common to have symptoms of depression while you are grieving. These include problems with sleep, loss of appetite, and lack of energy. They may last for weeks or months after a loss. Complicated grief is different from normal grief or depression. Normal grieving involves sadness and feelings of loss, but these feelings are not constant. Complicated grief is a constant and severe type of grief. It interferes with your ability to function normally. It may last for several months to a year or longer. Complicated grief may require treatment from a mental health care provider. What are the causes? It is not known why some people continue to struggle with grief and others do not. You may be at higher risk for complicated grief if:  The death of your loved one was sudden or unexpected.  The death of your loved one was due to a violent event.  Your loved one committed suicide.  Your loved one was a child or a young person.  You were very close to or dependent on the loved one.  You have a history of depression.  What are the signs or symptoms? Signs and symptoms of complicated grief may include:  Feeling disbelief or numbness.  Being unable to enjoy good memories of your loved one.  Needing to avoid anything that reminds you of your loved one.  Being unable to stop thinking about the death.  Feeling intense anger or guilt.  Feeling alone and hopeless.  Feeling that your life is meaningless and empty.  Losing the desire to live.  How is this diagnosed? Your health care provider may diagnose complicated grief if:  You have constant symptoms of grief for 6-12 months or longer.  Your  symptoms are interfering with your ability to live your life.  Your health care provider may want you to see a mental health care provider. Many symptoms of depression are similar to the symptoms of complicated grief. It is important to be evaluated for complicated grief along with other mental health conditions. How is this treated? Talk therapy with a mental health provider is the most common treatment for complicated grief. During therapy, you will learn healthy ways to cope with the loss of your loved one. In some cases, your mental health care provider may also recommend antidepressant medicines. Follow these instructions at home:  Take care of yourself. ? Eat regular meals and maintain a healthy diet. Eat plenty of fruits, vegetables, and whole grains. ? Try to get some exercise each day. ? Keep regular hours for sleep. Try to get at least 8 hours of sleep each night.  Do not use drugs or alcohol to ease your symptoms.  Take medicines only as directed by your health care provider.  Spend time with friends and loved ones.  Consider joining a grief (bereavement) support group to help you deal with your loss.  Keep all follow-up visits as directed by your health care provider. This is important. Contact a health care provider if:  Your symptoms keep you from functioning normally.  Your symptoms do not get better with treatment. Get help right away if:  You  have serious thoughts of hurting yourself or someone else.  You have suicidal feelings. This information is not intended to replace advice given to you by your health care provider. Make sure you discuss any questions you have with your health care provider. Document Released: 03/04/2005 Document Revised: 08/10/2015 Document Reviewed: 08/12/2013 Elsevier Interactive Patient Education  [employer].

## 2017-09-15 ENCOUNTER — Ambulatory Visit: Payer: 59 | Admitting: Internal Medicine

## 2017-10-01 MED FILL — FLUCONAZOLE 150 MG TABS: 150 | 3 days supply | Qty: 2 | Fill #0

## 2017-10-06 DIAGNOSIS — Z30431 Encounter for routine checking of intrauterine contraceptive device: Secondary | ICD-10-CM | POA: Diagnosis not present

## 2017-10-06 MED FILL — NAPROXEN 500 MG TABLET: 500 | 30 days supply | Qty: 60 | Fill #0

## 2017-10-17 DIAGNOSIS — N898 Other specified noninflammatory disorders of vagina: Secondary | ICD-10-CM | POA: Diagnosis not present

## 2017-10-21 MED FILL — buPROPion HCL ER (XL) 150 M: 150 | 30 days supply | Qty: 60 | Fill #1

## 2017-11-28 DIAGNOSIS — J029 Acute pharyngitis, unspecified: Secondary | ICD-10-CM | POA: Diagnosis not present

## 2017-12-29 ENCOUNTER — Ambulatory Visit (INDEPENDENT_AMBULATORY_CARE_PROVIDER_SITE_OTHER): Payer: 59 | Admitting: *Deleted

## 2017-12-29 DIAGNOSIS — Z113 Encounter for screening for infections with a predominantly sexual mode of transmission: Secondary | ICD-10-CM | POA: Diagnosis not present

## 2017-12-29 DIAGNOSIS — B373 Candidiasis of vulva and vagina: Secondary | ICD-10-CM | POA: Diagnosis not present

## 2017-12-29 DIAGNOSIS — Z23 Encounter for immunization: Secondary | ICD-10-CM | POA: Diagnosis not present

## 2018-02-11 DIAGNOSIS — Z30432 Encounter for removal of intrauterine contraceptive device: Secondary | ICD-10-CM | POA: Diagnosis not present

## 2018-03-24 ENCOUNTER — Ambulatory Visit: Payer: 59 | Admitting: Internal Medicine

## 2018-03-24 ENCOUNTER — Encounter: Payer: Self-pay | Admitting: Internal Medicine

## 2018-03-24 VITALS — BP 116/78 | HR 96 | Temp 98.6°F | Ht 67.0 in | Wt 196.6 lb

## 2018-03-24 DIAGNOSIS — R635 Abnormal weight gain: Secondary | ICD-10-CM | POA: Diagnosis not present

## 2018-03-24 DIAGNOSIS — Z131 Encounter for screening for diabetes mellitus: Secondary | ICD-10-CM

## 2018-03-24 DIAGNOSIS — Z13220 Encounter for screening for lipoid disorders: Secondary | ICD-10-CM | POA: Diagnosis not present

## 2018-03-24 DIAGNOSIS — Z0000 Encounter for general adult medical examination without abnormal findings: Secondary | ICD-10-CM

## 2018-03-24 DIAGNOSIS — Z136 Encounter for screening for cardiovascular disorders: Secondary | ICD-10-CM | POA: Diagnosis not present

## 2018-03-24 DIAGNOSIS — Z79899 Other long term (current) drug therapy: Secondary | ICD-10-CM

## 2018-03-24 DIAGNOSIS — F988 Other specified behavioral and emotional disorders with onset usually occurring in childhood and adolescence: Secondary | ICD-10-CM | POA: Diagnosis not present

## 2018-03-24 MED ORDER — LISDEXAMFETAMINE DIMESYLATE 40 MG PO CAPS: 40.0000 mg | ORAL_CAPSULE | ORAL | 0 refills | Status: DC

## 2018-03-24 MED FILL — VYVANSE 40 MG CAPSULE: 40 | 90 days supply | Qty: 90 | Fill #0

## 2018-03-24 NOTE — Patient Instructions (Addendum)
Checking  Lab for cholesterol and sugar.  Limit calories in beverages . Mediterranean eating . Processed carbs chips breaded meals  Late night snacking.        Preventive Care for [place], Female The transition to life after high school as a young adult can be a stressful time with many changes. You may start seeing a primary care physician instead of a pediatrician. This is the time when your health care becomes your responsibility. Preventive care refers to lifestyle choices and visits with your health care provider that can promote health and wellness. What does preventive care include?  A yearly physical exam. This is also called an annual wellness visit.  Dental exams once or twice a year.  Routine eye exams. Ask your health care provider how often you should have your eyes checked.  Personal lifestyle choices, including: ? Daily care of your teeth and gums. ? Regular physical activity. ? Eating a healthy diet. ? Avoiding tobacco and drug use. ? Avoiding or limiting alcohol use. ? Practicing safe sex. ? Taking vitamin and mineral supplements as recommended by your health care provider. What happens during an annual wellness visit? Preventive care starts with a yearly visit to your primary care physician. The services and screenings done by your health care provider during your annual wellness visit will depend on your overall health, lifestyle risk factors, and family history of disease. Counseling Your health care provider may ask you questions about:  Past medical problems and your family's medical history.  Medicines or supplements you take.  Health insurance and access to health care.  Alcohol, tobacco, and drug use.  Your safety at home, work, or school.  Access to firearms.  Emotional well-being and how you cope with stress.  Relationship well-being.  Diet, exercise, and sleep habits.  Your sexual health and activity.  Your methods of birth  control.  Your menstrual cycle.  Your pregnancy history. Screening You may have the following tests or measurements:  Height, weight, and BMI.  Blood pressure.  Lipid and cholesterol levels.  Tuberculosis skin test.  Skin exam.  Vision and hearing tests.  Screening test for hepatitis.  Screening tests for sexually transmitted diseases (STDs), if you are at risk.  BRCA-related cancer screening. This may be done if you have a family history of breast, ovarian, tubal, or peritoneal cancers.  Pelvic exam and Pap test. This may be done every 3 years starting at age 61. Vaccines Your health care provider may recommend certain vaccines, such as:  Influenza vaccine. This is recommended every year.  Tetanus, diphtheria, and acellular pertussis (Tdap, Td) vaccine. You may need a Td booster every 10 years.  Varicella vaccine. You may need this if you have not been vaccinated.  HPV vaccine. If you are 20 or younger, you may need three doses over 6 months.  Measles, mumps, and rubella (MMR) vaccine. You may need at least one dose of MMR. You may also need a second dose.  Pneumococcal 13-valent conjugate (PCV13) vaccine. You may need this if you have certain conditions and were not previously vaccinated.  Pneumococcal polysaccharide (PPSV23) vaccine. You may need one or two doses if you smoke cigarettes or if you have certain conditions.  Meningococcal vaccine. One dose is recommended if you are age 42-21 years and a first-year college student living in a residence hall, or if you have one of several medical conditions. You may also need additional booster doses.  Hepatitis A vaccine. You may need this  if you have certain conditions or if you travel or work in places where you may be exposed to hepatitis A.  Hepatitis B vaccine. You may need this if you have certain conditions or if you travel or work in places where you may be exposed to hepatitis B.  Haemophilus influenzae type  b (Hib) vaccine. You may need this if you have certain risk factors. Talk to your health care provider about which screenings and vaccines you need and how often you need them. What steps can I take to develop healthy behaviors?      Have regular preventive health care visits with your primary care physician and dentist.  Eat a healthy diet.  Drink enough fluid to keep your urine pale yellow.  Stay active. Exercise at least 30 minutes 5 or more days of the week.  Use alcohol responsibly.  Maintain a healthy weight.  Do not use any products that contain nicotine, such as cigarettes, chewing tobacco, and e-cigarettes. If you need help quitting, ask your health care provider.  Do not use drugs.  Practice safe sex.  Use birth control (contraception) to prevent unwanted pregnancy. If you plan to become pregnant, see your health care provider for a pre-conception visit.  Find healthy ways to manage stress. How can I protect myself from injury? Injuries from violence or accidents are the leading cause of death among young adults and can often be prevented. Take these steps to help protect yourself:  Always wear your seat belt while driving or riding in a vehicle.  Do not drive if you have been drinking alcohol. Do not ride with someone who has been drinking.  Do not drive when you are tired or distracted. Do not text while driving.  Wear a helmet and other protective equipment during sports activities.  If you have firearms in your house, make sure you follow all gun safety procedures.  Seek help if you have been bullied, physically abused, or sexually abused.  Use the Internet responsibly to avoid dangers such as online bullying and online sexual predators. What can I do to cope with stress? Young adults may face many new challenges that can be stressful, such as finding a job, going to college, moving away from home, managing money, being in a relationship, getting married,  and having children. To manage stress:  Avoid known stressful situations when you can.  Exercise regularly.  Find a stress-reducing activity that works best for you. Examples include meditation, yoga, listening to music, or reading.  Spend time in nature.  Keep a journal to write about your stress and how you respond.  Talk to your health care provider about stress. He or she may suggest counseling.  Spend time with supportive friends or family.  Do not cope with stress by: ? Drinking alcohol or using drugs. ? Smoking cigarettes. ? Eating. Where can I get more information? Learn more about preventive care and healthy habits from:  [place] and Gynecologists: KaraokeExchange.nl  U.[place] Probation officer Task Force: StageSync.si  National Adolescent and [place]: StrategicRoad.nl  American Academy of Pediatrics Bright Futures: https://brightfutures.MemberVerification.co.za  Society for Adolescent Health and Medicine: MoralBlog.co.za.aspx  PodExchange.nl: ToyLending.fr This information is not intended to replace advice given to you by your health care provider. Make sure you discuss any questions you have with your health care provider. Document Released: 07/20/2015 Document Revised: 10/15/2016 Document Reviewed: 07/20/2015 Elsevier Interactive Patient Education  2019 [place] Diet  A Mediterranean diet refers to food and lifestyle choices that are based on the traditions of countries located on the [employer]. This way of eating has been shown to help prevent certain conditions and improve outcomes for people who have chronic diseases, like kidney disease and heart disease. What are tips for  following this plan? Lifestyle  Cook and eat meals together with your family, when possible.  Drink enough fluid to keep your urine clear or pale yellow.  Be physically active every day. This includes: ? Aerobic exercise like running or swimming. ? Leisure activities like gardening, walking, or housework.  Get 7-8 hours of sleep each night.  If recommended by your health care provider, drink red wine in moderation. This means 1 glass a day for nonpregnant women and 2 glasses a day for men. A glass of wine equals 5 oz (150 mL). Reading food labels   Check the serving size of packaged foods. For foods such as rice and pasta, the serving size refers to the amount of cooked product, not dry.  Check the total fat in packaged foods. Avoid foods that have saturated fat or trans fats.  Check the ingredients list for added sugars, such as corn syrup. Shopping  At the grocery store, buy most of your food from the areas near the walls of the store. This includes: ? Fresh fruits and vegetables (produce). ? Grains, beans, nuts, and seeds. Some of these may be available in unpackaged forms or large amounts (in bulk). ? Fresh seafood. ? Poultry and eggs. ? Low-fat dairy products.  Buy whole ingredients instead of prepackaged foods.  Buy fresh fruits and vegetables in-season from local farmers markets.  Buy frozen fruits and vegetables in resealable bags.  If you do not have access to quality fresh seafood, buy precooked frozen shrimp or canned fish, such as tuna, salmon, or sardines.  Buy small amounts of raw or cooked vegetables, salads, or olives from the deli or salad bar at your store.  Stock your pantry so you always have certain foods on hand, such as olive oil, canned tuna, canned tomatoes, rice, pasta, and beans. Cooking  Cook foods with extra-virgin olive oil instead of using butter or other vegetable oils.  Have meat as a side dish, and have vegetables or grains as your main  dish. This means having meat in small portions or adding small amounts of meat to foods like pasta or stew.  Use beans or vegetables instead of meat in common dishes like chili or lasagna.  Experiment with different cooking methods. Try roasting or broiling vegetables instead of steaming or sauteing them.  Add frozen vegetables to soups, stews, pasta, or rice.  Add nuts or seeds for added healthy fat at each meal. You can add these to yogurt, salads, or vegetable dishes.  Marinate fish or vegetables using olive oil, lemon juice, garlic, and fresh herbs. Meal planning   Plan to eat 1 vegetarian meal one day each week. Try to work up to 2 vegetarian meals, if possible.  Eat seafood 2 or more times a week.  Have healthy snacks readily available, such as: ? Vegetable sticks with hummus. ? [place] yogurt. ? Fruit and nut trail mix.  Eat balanced meals throughout the week. This includes: ? Fruit: 2-3 servings a day ? Vegetables: 4-5 servings a day ? Low-fat dairy: 2 servings a day ? Fish, poultry, or lean meat: 1 serving a day ? Beans and legumes: 2 or more servings a week ?  Nuts and seeds: 1-2 servings a day ? Whole grains: 6-8 servings a day ? Extra-virgin olive oil: 3-4 servings a day  Limit red meat and sweets to only a few servings a month What are my food choices?  Mediterranean diet ? Recommended ? Grains: Whole-grain pasta. Brown rice. Bulgar wheat. Polenta. Couscous. Whole-wheat bread. [place]. ? Vegetables: Artichokes. Beets. Broccoli. Cabbage. Carrots. Eggplant. Green beans. Chard. Kale. Spinach. Onions. Leeks. Peas. Squash. Tomatoes. Peppers. Radishes. ? Fruits: Apples. Apricots. Avocado. Berries. Bananas. Cherries. Dates. Figs. Grapes. Lemons. Melon. Oranges. Peaches. Plums. Pomegranate. ? Meats and other protein foods: Beans. Almonds. Sunflower seeds. Pine nuts. Peanuts. [place]. Salmon. Scallops. Shrimp. [place]. Tilapia. Clams. Oysters. Eggs. ? Dairy: Low-fat milk.  Cheese. Greek yogurt. ? Beverages: Water. Red wine. Herbal tea. ? Fats and oils: Extra virgin olive oil. Avocado oil. Grape seed oil. ? Sweets and desserts: [place] yogurt with honey. Baked apples. Poached pears. Trail mix. ? Seasoning and other foods: Basil. Cilantro. Coriander. Cumin. Mint. Parsley. Sage. Rosemary. Tarragon. Garlic. Oregano. Thyme. Pepper. Balsalmic vinegar. Tahini. Hummus. Tomato sauce. Olives. Mushrooms. ? Limit these ? Grains: Prepackaged pasta or rice dishes. Prepackaged cereal with added sugar. ? Vegetables: Deep fried potatoes (french fries). ? Fruits: Fruit canned in syrup. ? Meats and other protein foods: Beef. Pork. Lamb. Poultry with skin. Hot dogs. [place] Salines. ? Dairy: Ice cream. Sour cream. Whole milk. ? Beverages: Juice. Sugar-sweetened soft drinks. Beer. Liquor and spirits. ? Fats and oils: Butter. Canola oil. Vegetable oil. Beef fat (tallow). Lard. ? Sweets and desserts: Cookies. Cakes. Pies. Candy. ? Seasoning and other foods: Mayonnaise. Premade sauces and marinades. ? The items listed may not be a complete list. Talk with your dietitian about what dietary choices are right for you. Summary  The Mediterranean diet includes both food and lifestyle choices.  Eat a variety of fresh fruits and vegetables, beans, nuts, seeds, and whole grains.  Limit the amount of red meat and sweets that you eat.  Talk with your health care provider about whether it is safe for you to drink red wine in moderation. This means 1 glass a day for nonpregnant women and 2 glasses a day for men. A glass of wine equals 5 oz (150 mL). This information is not intended to replace advice given to you by your health care provider. Make sure you discuss any questions you have with your health care provider. Document Released: 10/26/2015 Document Revised: 11/28/2015 Document Reviewed: 10/26/2015 Elsevier Interactive Patient Education  2019 [employer].

## 2018-03-24 NOTE — Progress Notes (Signed)
Chief Complaint  Patient presents with  . Annual Exam    Pt would like cholestrol change    HPI: Patient  Lisa Hines  23 y.o. comes in today for Preventive Health Care visit  Has been gaingin weight  Over pst year but working  To get it down in past weeks   Under eval for breast lump  Recent change from iud to ocps per gyn Needs refill vyvanse has helped her  Still  Not taking on weekends and denies sisg se .   Health Maintenance  Topic Date Due  . TETANUS/TDAP  10/20/2018  . INFLUENZA VACCINE  Completed  . HIV Screening  Completed   Health Maintenance Review LIFESTYLE:  Exercise:  Walk and gym.  Tobacco/ETS   vaping in poast  2-3 per day . : none recently  Trying to stop  Alcohol: 2 per night  .  Sugar beverages: no Sleep: 8  .  Drug use: no   HH of apt in sorority    Work:/school junior   Psych    [employer]    ROS:  GEN/ HEENT: No fever, significant weight changes sweats headaches vision problems hearing changes, CV/ PULM; No chest pain shortness of breath cough, syncope,edema  change in exercise tolerance. GI /GU: No adominal pain, vomiting, change in bowel habits. No blood in the stool. No significant GU symptoms. SKIN/HEME: ,no acute skin rashes suspicious lesions or bleeding. No lymphadenopathy, nodules, masses.  NEURO/ PSYCH:  No neurologic signs such as weakness numbness. No depression anxiety. IMM/ Allergy: No unusual infections.  Allergy .   REST of 12 system review negative except as per HPI   Past Medical History:  Diagnosis Date  . Acute appendicitis with localized peritonitis 10/28/2014  . ADD (attention deficit disorder)   . Family history of adverse reaction to anesthesia    unknown---pt. is adopted  . History of Clostridium difficile colitis   . Infectious mononucleosis 01/25/2014   no obvious complication except she complains of severe pain in her throat not responsive to ibuprofen discussed  options pain medicines steroid risk benefit   .  Irregular menses 11/08/2010  . Knee injury 04/19/2011   Poss traumatic bursitis tendinitis knee looks stable  .   Marland Kitchen Myopia   . Pes planus    consult [facility]  . Tonsillar hypertrophy     Past Surgical History:  Procedure Laterality Date  . APPENDECTOMY    . LAPAROSCOPIC APPENDECTOMY N/A 10/28/2014   Procedure: APPENDECTOMY LAPAROSCOPIC;  Surgeon: Excell Seltzer, MD;  Location: [facility];  Service: General;  Laterality: N/A;  . MYRINGOTOMY    . TONSILLECTOMY Bilateral 03/19/2017   Procedure: TONSILLECTOMY;  Surgeon: Melissa Montane, MD;  Location: [facility];  Service: ENT;  Laterality: Bilateral;    Family History  Adopted: Yes  Problem Relation Age of Onset  . ADD / ADHD Brother        possible ld    Social History   Socioeconomic History  . Marital status: Single    Spouse name: Not on file  . Number of children: Not on file  . Years of education: Not on file  . Highest education level: Not on file  Occupational History  . Not on file  Social Needs  . Financial resource strain: Not on file  . Food insecurity:    Worry: Not on file    Inability: Not on file  . Transportation needs:    Medical: Not on file  Non-medical: Not on file  Tobacco Use  . Smoking status: Never Smoker  . Smokeless tobacco: Never Used  Substance and Sexual Activity  . Alcohol use: Yes    Alcohol/week: 0.0 standard drinks    Comment: ocassionally   . Drug use: No  . Sexual activity: Not on file  Lifestyle  . Physical activity:    Days per week: Not on file    Minutes per session: Not on file  . Stress: Not on file  Relationships  . Social connections:    Talks on phone: Not on file    Gets together: Not on file    Attends religious service: Not on file    Active member of club or organization: Not on file    Attends meetings of clubs or organizations: Not on file    Relationship status: Not on file  Other Topics Concern  . Not on file  Social History Narrative    Caretaker verifies today that the child's current immunizations are up to date.   Child is adopted   Sib adopted   Active at school gymnastics, swimming, tennis and horseback riding seasonal now volleyball   Sleep about 9 hours or so   AL program   [employer]   ib courses   Field hockey diving and lacrosse in past    Intact family       Outpatient Medications Prior to Visit  Medication Sig Dispense Refill  . drospirenone-ethinyl estradiol (YAZ,GIANVI,LORYNA) 3-0.02 MG tablet Take 1 tablet by mouth daily.    Marland Kitchen lisdexamfetamine (VYVANSE) 40 MG capsule Take 1 capsule (40 mg total) by mouth every morning. 90 capsule 0  . buPROPion (WELLBUTRIN XL) 150 MG 24 hr tablet Take 1 tablet (150 mg total) by mouth daily. Increase. To 2 po qd after 1 week 60 tablet 1  . Levonorgestrel (KYLEENA IU) by Intrauterine route.     No facility-administered medications prior to visit.      EXAM:  BP 116/78 (BP Location: Right Arm, Patient Position: Sitting, Cuff Size: Normal)   Pulse 96   Temp 98.6 F (37 C) (Oral)   Ht 5' 7"  (1.702 m)   Wt 196 lb 9.6 oz (89.2 kg)   BMI 30.79 kg/m   Body mass index is 30.79 kg/m. Wt Readings from Last 3 Encounters:  03/24/18 196 lb 9.6 oz (89.2 kg)  09/12/17 177 lb 9.6 oz (80.6 kg)  03/19/17 167 lb 3.2 oz (75.8 kg) (91 %, Z= 1.32)*   * Growth percentiles are based on CDC (Girls, 2-20 Years) data.    Physical Exam: Vital signs reviewed WUX:LKGM is a well-developed well-nourished alert cooperative    who appearsr stated age in no acute distress.  HEENT: normocephalic atraumatic , Eyes: PERRL EOM's full, conjunctiva clear, Nares: paten,t no deformity discharge or tenderness., Ears: no deformity EAC's clear TMs with normal landmarks. Mouth: clear OP, no lesions, edema.  Moist mucous membranes. Dentition in adequate repair. NECK: supple without masses, thyromegaly or bruits. CHEST/PULM:  Clear to auscultation and percussion breath sounds equal no wheeze , rales or  rhonchi.  CV: PMI is nondisplaced, S1 S2 no gallops, murmurs, rubs. Peripheral pulses are full without delay.No JVD .  ABDOMEN: Bowel sounds normal nontender  No guard or rebound, no hepato splenomegal no CVA tenderness.   Extremtities:  No clubbing cyanosis or edema, no acute joint swelling or redness no focal atrophy NEURO:  Oriented x3, cranial nerves 3-12 appear to be intact, no obvious focal weakness,gait  within normal limits no abnormal reflexes or asymmetrical SKIN: No acute rashes normal turgor, color, no bruising or petechiae. PSYCH: Oriented, good eye contact, no obvious depression anxiety, cognition and judgment appear normal. LN: no cervical axillary iadenopathy    BP Readings from Last 3 Encounters:  03/24/18 116/78  09/12/17 102/60  03/19/17 121/89   Wt Readings from Last 3 Encounters:  03/24/18 196 lb 9.6 oz (89.2 kg)  09/12/17 177 lb 9.6 oz (80.6 kg)  03/19/17 167 lb 3.2 oz (75.8 kg) (91 %, Z= 1.32)*   * Growth percentiles are based on CDC (Girls, 2-20 Years) data.    Lab plan  Pt is fasting  ASSESSMENT AND PLAN:  Discussed the following assessment and plan:  Visit for preventive health examination - Plan: Basic metabolic panel, CBC with Differential/Platelet, Hemoglobin A1c, Hepatic function panel, Lipid panel, TSH, T4, free  Encounter for lipid screening for cardiovascular disease - Plan: Basic metabolic panel, CBC with Differential/Platelet, Hemoglobin A1c, Hepatic function panel, Lipid panel, TSH, T4, free  Screening for diabetes mellitus - Plan: Basic metabolic panel, CBC with Differential/Platelet, Hemoglobin A1c, Hepatic function panel, Lipid panel, TSH, T4, free  Medication management - Plan: Basic metabolic panel, CBC with Differential/Platelet, Hemoglobin A1c, Hepatic function panel, Lipid panel, TSH, T4, free  Attention deficit disorder, unspecified hyperactivity presence  Weight gain Disc  Heathy eating  Weight control lab plans   Counseled  regarding healthy nutrition, exercise, sleep, injury prevention, and healthy weight . Benefit more than risk of medication  to continue. Has had sti screening via gyne etc .  Feels mood is   Stable Patient Care Team: Panosh, Standley Brooking, MD as PCP - General Patient Instructions  Checking  Lab for cholesterol and sugar.  Limit calories in beverages . Mediterranean eating . Processed carbs chips breaded meals  Late night snacking.        Preventive Care for [place], Female The transition to life after high school as a young adult can be a stressful time with many changes. You may start seeing a primary care physician instead of a pediatrician. This is the time when your health care becomes your responsibility. Preventive care refers to lifestyle choices and visits with your health care provider that can promote health and wellness. What does preventive care include?  A yearly physical exam. This is also called an annual wellness visit.  Dental exams once or twice a year.  Routine eye exams. Ask your health care provider how often you should have your eyes checked.  Personal lifestyle choices, including: ? Daily care of your teeth and gums. ? Regular physical activity. ? Eating a healthy diet. ? Avoiding tobacco and drug use. ? Avoiding or limiting alcohol use. ? Practicing safe sex. ? Taking vitamin and mineral supplements as recommended by your health care provider. What happens during an annual wellness visit? Preventive care starts with a yearly visit to your primary care physician. The services and screenings done by your health care provider during your annual wellness visit will depend on your overall health, lifestyle risk factors, and family history of disease. Counseling Your health care provider may ask you questions about:  Past medical problems and your family's medical history.  Medicines or supplements you take.  Health insurance and access to health  care.  Alcohol, tobacco, and drug use.  Your safety at home, work, or school.  Access to firearms.  Emotional well-being and how you cope with stress.  Relationship well-being.  Diet, exercise, and  sleep habits.  Your sexual health and activity.  Your methods of birth control.  Your menstrual cycle.  Your pregnancy history. Screening You may have the following tests or measurements:  Height, weight, and BMI.  Blood pressure.  Lipid and cholesterol levels.  Tuberculosis skin test.  Skin exam.  Vision and hearing tests.  Screening test for hepatitis.  Screening tests for sexually transmitted diseases (STDs), if you are at risk.  BRCA-related cancer screening. This may be done if you have a family history of breast, ovarian, tubal, or peritoneal cancers.  Pelvic exam and Pap test. This may be done every 3 years starting at age 40. Vaccines Your health care provider may recommend certain vaccines, such as:  Influenza vaccine. This is recommended every year.  Tetanus, diphtheria, and acellular pertussis (Tdap, Td) vaccine. You may need a Td booster every 10 years.  Varicella vaccine. You may need this if you have not been vaccinated.  HPV vaccine. If you are 71 or younger, you may need three doses over 6 months.  Measles, mumps, and rubella (MMR) vaccine. You may need at least one dose of MMR. You may also need a second dose.  Pneumococcal 13-valent conjugate (PCV13) vaccine. You may need this if you have certain conditions and were not previously vaccinated.  Pneumococcal polysaccharide (PPSV23) vaccine. You may need one or two doses if you smoke cigarettes or if you have certain conditions.  Meningococcal vaccine. One dose is recommended if you are age 69-21 years and a first-year college student living in a residence hall, or if you have one of several medical conditions. You may also need additional booster doses.  Hepatitis A vaccine. You may need this if  you have certain conditions or if you travel or work in places where you may be exposed to hepatitis A.  Hepatitis B vaccine. You may need this if you have certain conditions or if you travel or work in places where you may be exposed to hepatitis B.  Haemophilus influenzae type b (Hib) vaccine. You may need this if you have certain risk factors. Talk to your health care provider about which screenings and vaccines you need and how often you need them. What steps can I take to develop healthy behaviors?      Have regular preventive health care visits with your primary care physician and dentist.  Eat a healthy diet.  Drink enough fluid to keep your urine pale yellow.  Stay active. Exercise at least 30 minutes 5 or more days of the week.  Use alcohol responsibly.  Maintain a healthy weight.  Do not use any products that contain nicotine, such as cigarettes, chewing tobacco, and e-cigarettes. If you need help quitting, ask your health care provider.  Do not use drugs.  Practice safe sex.  Use birth control (contraception) to prevent unwanted pregnancy. If you plan to become pregnant, see your health care provider for a pre-conception visit.  Find healthy ways to manage stress. How can I protect myself from injury? Injuries from violence or accidents are the leading cause of death among young adults and can often be prevented. Take these steps to help protect yourself:  Always wear your seat belt while driving or riding in a vehicle.  Do not drive if you have been drinking alcohol. Do not ride with someone who has been drinking.  Do not drive when you are tired or distracted. Do not text while driving.  Wear a helmet and other protective equipment during sports  activities.  If you have firearms in your house, make sure you follow all gun safety procedures.  Seek help if you have been bullied, physically abused, or sexually abused.  Use the Internet responsibly to avoid  dangers such as online bullying and online sexual predators. What can I do to cope with stress? Young adults may face many new challenges that can be stressful, such as finding a job, going to college, moving away from home, managing money, being in a relationship, getting married, and having children. To manage stress:  Avoid known stressful situations when you can.  Exercise regularly.  Find a stress-reducing activity that works best for you. Examples include meditation, yoga, listening to music, or reading.  Spend time in nature.  Keep a journal to write about your stress and how you respond.  Talk to your health care provider about stress. He or she may suggest counseling.  Spend time with supportive friends or family.  Do not cope with stress by: ? Drinking alcohol or using drugs. ? Smoking cigarettes. ? Eating. Where can I get more information? Learn more about preventive care and healthy habits from:  [place] and Gynecologists: KaraokeExchange.nl  U.S. Probation officer Task Force: StageSync.si  National Adolescent and [place]: StrategicRoad.nl  American Academy of Pediatrics Bright Futures: https://brightfutures.MemberVerification.co.za  Society for Adolescent Health and Medicine: MoralBlog.co.za.aspx  PodExchange.nl: ToyLending.fr This information is not intended to replace advice given to you by your health care provider. Make sure you discuss any questions you have with your health care provider. Document Released: 07/20/2015 Document Revised: 10/15/2016 Document Reviewed: 07/20/2015 Elsevier Interactive Patient Education  2019 [place] refers to food and  lifestyle choices that are based on the traditions of countries located on the [employer]. This way of eating has been shown to help prevent certain conditions and improve outcomes for people who have chronic diseases, like kidney disease and heart disease. What are tips for following this plan? Lifestyle  Cook and eat meals together with your family, when possible.  Drink enough fluid to keep your urine clear or pale yellow.  Be physically active every day. This includes: ? Aerobic exercise like running or swimming. ? Leisure activities like gardening, walking, or housework.  Get 7-8 hours of sleep each night.  If recommended by your health care provider, drink red wine in moderation. This means 1 glass a day for nonpregnant women and 2 glasses a day for men. A glass of wine equals 5 oz (150 mL). Reading food labels   Check the serving size of packaged foods. For foods such as rice and pasta, the serving size refers to the amount of cooked product, not dry.  Check the total fat in packaged foods. Avoid foods that have saturated fat or trans fats.  Check the ingredients list for added sugars, such as corn syrup. Shopping  At the grocery store, buy most of your food from the areas near the walls of the store. This includes: ? Fresh fruits and vegetables (produce). ? Grains, beans, nuts, and seeds. Some of these may be available in unpackaged forms or large amounts (in bulk). ? Fresh seafood. ? Poultry and eggs. ? Low-fat dairy products.  Buy whole ingredients instead of prepackaged foods.  Buy fresh fruits and vegetables in-season from local farmers markets.  Buy frozen fruits and vegetables in resealable bags.  If you do not have access to quality fresh seafood, buy  precooked frozen shrimp or canned fish, such as tuna, salmon, or sardines.  Buy small amounts of raw or cooked vegetables, salads, or olives from the deli or salad bar at your store.  Stock your pantry so  you always have certain foods on hand, such as olive oil, canned tuna, canned tomatoes, rice, pasta, and beans. Cooking  Cook foods with extra-virgin olive oil instead of using butter or other vegetable oils.  Have meat as a side dish, and have vegetables or grains as your main dish. This means having meat in small portions or adding small amounts of meat to foods like pasta or stew.  Use beans or vegetables instead of meat in common dishes like chili or lasagna.  Experiment with different cooking methods. Try roasting or broiling vegetables instead of steaming or sauteing them.  Add frozen vegetables to soups, stews, pasta, or rice.  Add nuts or seeds for added healthy fat at each meal. You can add these to yogurt, salads, or vegetable dishes.  Marinate fish or vegetables using olive oil, lemon juice, garlic, and fresh herbs. Meal planning   Plan to eat 1 vegetarian meal one day each week. Try to work up to 2 vegetarian meals, if possible.  Eat seafood 2 or more times a week.  Have healthy snacks readily available, such as: ? Vegetable sticks with hummus. ? [place] yogurt. ? Fruit and nut trail mix.  Eat balanced meals throughout the week. This includes: ? Fruit: 2-3 servings a day ? Vegetables: 4-5 servings a day ? Low-fat dairy: 2 servings a day ? Fish, poultry, or lean meat: 1 serving a day ? Beans and legumes: 2 or more servings a week ? Nuts and seeds: 1-2 servings a day ? Whole grains: 6-8 servings a day ? Extra-virgin olive oil: 3-4 servings a day  Limit red meat and sweets to only a few servings a month What are my food choices?  Mediterranean diet ? Recommended ? Grains: Whole-grain pasta. Brown rice. Bulgar wheat. Polenta. Couscous. Whole-wheat bread. [place]. ? Vegetables: Artichokes. Beets. Broccoli. Cabbage. Carrots. Eggplant. Green beans. Chard. Kale. Spinach. Onions. Leeks. Peas. Squash. Tomatoes. Peppers. Radishes. ? Fruits: Apples. Apricots.  Avocado. Berries. Bananas. Cherries. Dates. Figs. Grapes. Lemons. Melon. Oranges. Peaches. Plums. Pomegranate. ? Meats and other protein foods: Beans. Almonds. Sunflower seeds. Pine nuts. Peanuts. [place]. Salmon. Scallops. Shrimp. [place]. Tilapia. Clams. Oysters. Eggs. ? Dairy: Low-fat milk. Cheese. Greek yogurt. ? Beverages: Water. Red wine. Herbal tea. ? Fats and oils: Extra virgin olive oil. Avocado oil. Grape seed oil. ? Sweets and desserts: [place] yogurt with honey. Baked apples. Poached pears. Trail mix. ? Seasoning and other foods: Basil. Cilantro. Coriander. Cumin. Mint. Parsley. Sage. Rosemary. Tarragon. Garlic. Oregano. Thyme. Pepper. Balsalmic vinegar. Tahini. Hummus. Tomato sauce. Olives. Mushrooms. ? Limit these ? Grains: Prepackaged pasta or rice dishes. Prepackaged cereal with added sugar. ? Vegetables: Deep fried potatoes (french fries). ? Fruits: Fruit canned in syrup. ? Meats and other protein foods: Beef. Pork. Lamb. Poultry with skin. Hot dogs. [place] Salines. ? Dairy: Ice cream. Sour cream. Whole milk. ? Beverages: Juice. Sugar-sweetened soft drinks. Beer. Liquor and spirits. ? Fats and oils: Butter. Canola oil. Vegetable oil. Beef fat (tallow). Lard. ? Sweets and desserts: Cookies. Cakes. Pies. Candy. ? Seasoning and other foods: Mayonnaise. Premade sauces and marinades. ? The items listed may not be a complete list. Talk with your dietitian about what dietary choices are right for you. Summary  The Mediterranean diet includes both food and  lifestyle choices.  Eat a variety of fresh fruits and vegetables, beans, nuts, seeds, and whole grains.  Limit the amount of red meat and sweets that you eat.  Talk with your health care provider about whether it is safe for you to drink red wine in moderation. This means 1 glass a day for nonpregnant women and 2 glasses a day for men. A glass of wine equals 5 oz (150 mL). This information is not intended to replace advice given to you by your  health care provider. Make sure you discuss any questions you have with your health care provider. Document Released: 10/26/2015 Document Revised: 11/28/2015 Document Reviewed: 10/26/2015 Elsevier Interactive Patient Education  2019 [place] K. Panosh M.D.

## 2018-03-25 ENCOUNTER — Ambulatory Visit: Payer: Self-pay | Admitting: Internal Medicine

## 2018-03-25 ENCOUNTER — Other Ambulatory Visit: Payer: Self-pay | Admitting: Obstetrics

## 2018-03-25 DIAGNOSIS — D242 Benign neoplasm of left breast: Secondary | ICD-10-CM

## 2018-03-25 LAB — HEMOGLOBIN A1C: Hgb A1c MFr Bld: 5.3 % (ref 4.6–6.5)

## 2018-03-25 LAB — CBC WITH DIFFERENTIAL/PLATELET
BASOS ABS: 0.1 10*3/uL (ref 0.0–0.1)
Basophils Relative: 1 % (ref 0.0–3.0)
EOS ABS: 0.3 10*3/uL (ref 0.0–0.7)
EOS PCT: 2.7 % (ref 0.0–5.0)
HCT: 43.8 % (ref 36.0–46.0)
Hemoglobin: 14.9 g/dL (ref 12.0–15.0)
LYMPHS ABS: 1.9 10*3/uL (ref 0.7–4.0)
Lymphocytes Relative: 19.4 % (ref 12.0–46.0)
MCHC: 34.1 g/dL (ref 30.0–36.0)
MCV: 87.6 fl (ref 78.0–100.0)
MONO ABS: 0.6 10*3/uL (ref 0.1–1.0)
Monocytes Relative: 6.2 % (ref 3.0–12.0)
Neutro Abs: 6.9 10*3/uL (ref 1.4–7.7)
Neutrophils Relative %: 70.7 % (ref 43.0–77.0)
Platelets: 381 10*3/uL (ref 150.0–400.0)
RBC: 5 Mil/uL (ref 3.87–5.11)
RDW: 13.5 % (ref 11.5–14.6)
WBC: 9.7 10*3/uL (ref 4.5–10.5)

## 2018-03-25 LAB — LIPID PANEL
CHOLESTEROL: 191 mg/dL (ref 0–200)
HDL: 44.2 mg/dL (ref >39.00)
NONHDL: 147.2
TRIGLYCERIDES: 241 mg/dL — AB (ref 0.0–149.0)
Total CHOL/HDL Ratio: 4
VLDL: 48.2 mg/dL — ABNORMAL HIGH (ref 0.0–40.0)

## 2018-03-25 LAB — HEPATIC FUNCTION PANEL
ALT: 17 U/L (ref 0–35)
AST: 20 U/L (ref 0–37)
Albumin: 4.2 g/dL (ref 3.5–5.2)
Alkaline Phosphatase: 96 U/L (ref 39–117)
BILIRUBIN DIRECT: 0.1 mg/dL (ref 0.0–0.3)
TOTAL PROTEIN: 7 g/dL (ref 6.0–8.3)
Total Bilirubin: 0.5 mg/dL (ref 0.2–1.2)

## 2018-03-25 LAB — BASIC METABOLIC PANEL
BUN: 11 mg/dL (ref 6–23)
CALCIUM: 9.9 mg/dL (ref 8.4–10.5)
CO2: 23 mEq/L (ref 19–32)
Chloride: 103 mEq/L (ref 96–112)
Creatinine, Ser: 0.74 mg/dL (ref 0.40–1.20)
GFR: 105.48 mL/min (ref >60.00)
GLUCOSE: 84 mg/dL (ref 70–99)
Potassium: 4.3 mEq/L (ref 3.5–5.1)
Sodium: 136 mEq/L (ref 135–145)

## 2018-03-25 LAB — T4, FREE: Free T4: 0.85 ng/dL (ref 0.60–1.60)

## 2018-03-25 LAB — LDL CHOLESTEROL, DIRECT: Direct LDL: 125 mg/dL

## 2018-03-25 LAB — TSH: TSH: 2.16 u[IU]/mL (ref 0.35–5.50)

## 2018-03-27 ENCOUNTER — Ambulatory Visit
Admission: RE | Admit: 2018-03-27 | Discharge: 2018-03-27 | Disposition: A | Discharge status: Home / Self Care | Payer: 59 | Source: Ambulatory Visit | Attending: Obstetrics | Admitting: Obstetrics

## 2018-03-27 DIAGNOSIS — D242 Benign neoplasm of left breast: Secondary | ICD-10-CM

## 2018-04-03 ENCOUNTER — Other Ambulatory Visit: Payer: Self-pay

## 2018-04-03 MED ORDER — MUPIROCIN CALCIUM 2 % EX CREA: 1.0000 "application " | TOPICAL_CREAM | Freq: Three times a day (TID) | CUTANEOUS | 1 refills | Status: DC

## 2018-04-03 NOTE — Telephone Encounter (Signed)
Hi Sheetal   Are you able to send a  Picture ( confidential of course in  Our system )    WP

## 2018-04-03 NOTE — Telephone Encounter (Signed)
I have hesitation of using oral antibiotic cause of your hx of  c diff   May be we can try topical antibiotic  Send in  bactroban  Disp larger tube  Refill x 1   Apply to  Area tid for 10 - 14 days   If not helpful we may try a short course of  Antibiotic oral  But no guarnatees would resolve.   Cannot tell otherwise without looking

## 2018-04-06 NOTE — Telephone Encounter (Signed)
Whatever  ever is less expensive   I think the cream

## 2018-04-06 NOTE — Telephone Encounter (Signed)
Dr Regis Bill please advise if we are doing 2% Cream or Ointment. Thanks.

## 2018-04-07 NOTE — Telephone Encounter (Signed)
The Rx was sent in 04/03/2018 Pt advised to call if issues with insurance coverage Nothing further needed.

## 2018-05-07 ENCOUNTER — Telehealth: Payer: Self-pay | Admitting: Family Medicine

## 2018-05-07 MED ORDER — OMEPRAZOLE 40 MG PO CPDR: 40.0000 mg | DELAYED_RELEASE_CAPSULE | Freq: Every day | ORAL | 1 refills | Status: DC

## 2018-05-07 NOTE — Telephone Encounter (Signed)
Please send in a rx for Omeprazole 40 mg to take daily as needed for GERD, #90 with refills to the [facility] on [address] In [place]

## 2018-05-07 NOTE — Telephone Encounter (Signed)
Has been using  omeprezole   For a while otc not every day for hb gerd sx   Please send in rx  To local pharmacy . At school for cost reasons.  Will send in 90 days and 1 refill

## 2018-06-12 ENCOUNTER — Ambulatory Visit (HOSPITAL_COMMUNITY)
Admission: EM | Admit: 2018-06-12 | Discharge: 2018-06-12 | Disposition: A | Discharge status: Home / Self Care | Payer: 59 | Attending: Family Medicine | Admitting: Family Medicine

## 2018-06-12 ENCOUNTER — Other Ambulatory Visit: Payer: Self-pay

## 2018-06-12 ENCOUNTER — Encounter (HOSPITAL_COMMUNITY): Payer: Self-pay | Admitting: Emergency Medicine

## 2018-06-12 DIAGNOSIS — J019 Acute sinusitis, unspecified: Secondary | ICD-10-CM | POA: Diagnosis not present

## 2018-06-12 DIAGNOSIS — R6889 Other general symptoms and signs: Secondary | ICD-10-CM

## 2018-06-12 DIAGNOSIS — E860 Dehydration: Secondary | ICD-10-CM

## 2018-06-12 MED ORDER — FLUTICASONE PROPIONATE 50 MCG/ACT NA SUSP: 2.0000 | Freq: Every day | NASAL | 12 refills | Status: DC

## 2018-06-12 MED ORDER — CEFDINIR 300 MG PO CAPS: 600.0000 mg | ORAL_CAPSULE | Freq: Every day | ORAL | 0 refills | Status: DC

## 2018-06-12 MED ORDER — OSELTAMIVIR PHOSPHATE 75 MG PO CAPS: 75.0000 mg | ORAL_CAPSULE | Freq: Two times a day (BID) | ORAL | 0 refills | Status: DC

## 2018-06-12 MED ORDER — BENZONATATE 100 MG PO CAPS: 100.0000 mg | ORAL_CAPSULE | Freq: Three times a day (TID) | ORAL | 0 refills | Status: DC | PRN

## 2018-06-12 MED ORDER — ONDANSETRON 4 MG PO TBDP
ORAL_TABLET | ORAL | Status: AC
  Filled 2018-06-12: qty 1

## 2018-06-12 MED ORDER — ONDANSETRON 4 MG PO TBDP
4.0000 mg | ORAL_TABLET | Freq: Once | ORAL | Status: AC
  Administered 2018-06-12: 4 mg via ORAL

## 2018-06-12 NOTE — ED Triage Notes (Signed)
Pt reports body aches, fatigue, cough, SOB, and nasal congestion x3 days.  She reports a mild fever of 99.8 last night.  She took 1000mg  of Tylenol last and again at 0630 this morning.

## 2018-06-12 NOTE — ED Provider Notes (Signed)
[place]    CSN: [id number] Arrival date & time: 06/12/18  1632     History   Chief Complaint Chief Complaint  Patient presents with  . URI    HPI Lisa Hines is a 23 y.o. female.   HPI  Reports initially experienced nasal congestion, nasal drainage over 12 days ago which never resolved. Over the last 3 days, she has developed body aches, low-grade fever, headache (generalized), cough (non-productive), intermittent dizziness, and continues to have nasal congestion. She endorses facial tenderness. She endorses poor oral intake of fluids. BP soft on arrival today. She is trying to force fluids, although doesn't like water. No history of asthma or pneumonia. She has received an influenza vaccine for this season. No known sick contacts.  Past Medical History:  Diagnosis Date  . Acute appendicitis with localized peritonitis 10/28/2014  . ADD (attention deficit disorder)   . Family history of adverse reaction to anesthesia    unknown---pt. is adopted  . History of Clostridium difficile colitis   . Infectious mononucleosis 01/25/2014   no obvious complication except she complains of severe pain in her throat not responsive to ibuprofen discussed  options pain medicines steroid risk benefit   . Irregular menses 11/08/2010  . Knee injury 04/19/2011   Poss traumatic bursitis tendinitis knee looks stable  .   Marland Kitchen Myopia   . Pes planus    consult [facility]  . Tonsillar hypertrophy     Patient Active Problem List   Diagnosis Date Noted  . Left breast lump 08/12/2015  . Borderline anemia 09/27/2014  . Well adolescent visit 10/30/2011  . Acne 10/30/2011  . Pes planus 04/19/2011  . ADD (attention deficit disorder)   . Pes planus   . PES PLANUS, 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  . OTHER NONSPECIFIC FINDING EXAMINATION OF URINE 03/02/2007    Past Surgical History:  Procedure Laterality Date  . APPENDECTOMY    . LAPAROSCOPIC APPENDECTOMY N/A 10/28/2014   Procedure: APPENDECTOMY  LAPAROSCOPIC;  Surgeon: Excell Seltzer, MD;  Location: [facility];  Service: General;  Laterality: N/A;  . MYRINGOTOMY    . TONSILLECTOMY Bilateral 03/19/2017   Procedure: TONSILLECTOMY;  Surgeon: Melissa Montane, MD;  Location: [facility];  Service: ENT;  Laterality: Bilateral;    OB History   No obstetric history on file.      Home Medications    Prior to Admission medications   Medication Sig Start Date End Date Taking? Authorizing Provider  drospirenone-ethinyl estradiol (YAZ,GIANVI,LORYNA) 3-0.02 MG tablet Take 1 tablet by mouth daily.   Yes [provider]  omeprazole (PRILOSEC) 40 MG capsule Take 1 capsule (40 mg total) by mouth daily. 05/07/18  Yes Panosh, Standley Brooking, MD  lisdexamfetamine (VYVANSE) 40 MG capsule Take 1 capsule (40 mg total) by mouth every morning. 03/24/18   Panosh, Standley Brooking, MD  mupirocin cream (BACTROBAN) 2 % Apply 1 application topically 3 (three) times daily. 04/03/18   Panosh, Standley Brooking, MD    Family History Family History  Adopted: Yes  Problem Relation Age of Onset  . ADD / ADHD Brother        possible ld    Social History Social History   Tobacco Use  . Smoking status: Never Smoker  . Smokeless tobacco: Never Used  Substance Use Topics  . Alcohol use: Yes    Alcohol/week: 0.0 standard drinks    Comment: ocassionally   . Drug use: No    Allergies   Clindamycin; Amoxicillin-pot clavulanate; and  Penicillin g   Review of Systems Review of Systems Pertinent negatives listed in HPI Physical Exam Triage Vital Signs ED Triage Vitals  Enc Vitals Group     BP 06/12/18 1649 91/64     Pulse Rate 06/12/18 1649 (!) 118     Resp 06/12/18 1649 12     Temp 06/12/18 1649 99 F (37.2 C)     Temp Source 06/12/18 1649 Oral     SpO2 06/12/18 1649 98 %     Weight --      Height --      Head Circumference --      Peak Flow --      Pain Score 06/12/18 1645 6     Pain Loc --      Pain Edu? --      Excl. in [place]? --    No data found.   Updated Vital Signs BP 91/64 (BP Location: Left Arm)   Pulse (!) 118   Temp 99 F (37.2 C) (Oral)   Resp 12   LMP 04/22/2018 (Exact Date)   SpO2 98%   Visual Acuity Right Eye Distance:   Left Eye Distance:   Bilateral Distance:    Right Eye Near:   Left Eye Near:    Bilateral Near:     Physical Exam Constitutional:      Appearance: She is ill-appearing.  HENT:     Head: Normocephalic and atraumatic.     Right Ear: Tympanic membrane normal.     Left Ear: Tympanic membrane normal.     Nose: Mucosal edema and congestion present.     Right Turbinates: Enlarged and swollen.     Left Turbinates: Enlarged and swollen.     Right Sinus: Maxillary sinus tenderness present.     Left Sinus: Maxillary sinus tenderness present.     Mouth/Throat:     Pharynx: Posterior oropharyngeal erythema present. No oropharyngeal exudate.  Cardiovascular:     Rate and Rhythm: Tachycardia present.  Pulmonary:     Effort: Pulmonary effort is normal.     Breath sounds: Normal breath sounds.  Abdominal:     Palpations: Abdomen is soft.  Musculoskeletal: Normal range of motion.  Lymphadenopathy:     Cervical: Cervical adenopathy present.  Skin:    General: Skin is warm.  Neurological:     Mental Status: She is oriented to person, place, and time.  Psychiatric:        Behavior: Behavior normal.    UC Treatments / Results  Labs (all labs ordered are listed, but only abnormal results are displayed) Labs Reviewed - No data to display  EKG None  Radiology No results found.  Procedures Procedures (including critical care time)  Medications Ordered in UC Medications - No data to display  Initial Impression / Assessment and Plan / UC Course  I have reviewed the triage vital signs and the nursing notes.  Pertinent labs & imaging results that were available during my care of the patient were reviewed by me and considered in my medical decision making (see chart for details).    Patient  is a ill-appearing, normally healthy, 23 year old female presents with 3 days of low grade fever accompanied with flu -like symptoms, and more than two week history of sinus symptoms. Will treat empirically for both sinusitis and flu-like symptoms. Recommending forcing fluids to correct dehydration. No worrisome concerns for COVID. Patient is stable. Verbalizes understanding and agreement with plan. Questions answered. Red flags discussed. Final  Clinical Impressions(s) / UC Diagnoses   Final diagnoses:  Acute sinusitis, recurrence not specified, unspecified location  Flu-like symptoms  Dehydration   Discharge Instructions   None    ED Prescriptions    Medication Sig Dispense Auth. Provider   cefdinir (OMNICEF) 300 MG capsule Take 2 capsules (600 mg total) by mouth daily. 20 capsule Scot Jun, FNP   fluticasone (FLONASE) 50 MCG/ACT nasal spray Place 2 sprays into both nostrils daily. 16 g Scot Jun, FNP   oseltamivir (TAMIFLU) 75 MG capsule Take 1 capsule (75 mg total) by mouth 2 (two) times daily. 10 capsule Scot Jun, FNP   benzonatate (TESSALON) 100 MG capsule Take 1-2 capsules (100-200 mg total) by mouth 3 (three) times daily as needed for cough. 40 capsule Scot Jun, FNP     Controlled Substance Prescriptions [place] Controlled Substance Registry consulted? Not Applicable   Scot Jun, [place] 06/13/18 1345

## 2018-06-15 ENCOUNTER — Telehealth: Payer: Self-pay

## 2018-06-15 MED ORDER — ONDANSETRON 4 MG PO TBDP
4.0000 mg | ORAL_TABLET | Freq: Three times a day (TID) | ORAL | 0 refills | Status: DC | PRN
Start: 2018-06-15 — End: 2018-09-10

## 2018-06-15 MED ORDER — FLUCONAZOLE 150 MG PO TABS: 150.0000 mg | ORAL_TABLET | Freq: Once | ORAL | 0 refills | Status: AC

## 2018-06-15 NOTE — Telephone Encounter (Signed)
Father requests   Refill on diflucan   By report she was placed on antibiotic  Over the weekend for [facility]ustisi at [facility]  ceftin and I simproved but  Gets yeast  Frequently  She is at home   [place] stay at home directives from  covid 19 pandemic

## 2018-06-15 NOTE — Telephone Encounter (Signed)
Sent in as requested to [facility]

## 2018-06-15 NOTE — Addendum Note (Signed)
Addended byShanon Ace K on: 06/15/2018 02:05 PM   Modules accepted: Orders

## 2018-06-15 NOTE — Telephone Encounter (Signed)
Pt is asking that ondansetron 4mg  #30 prn for nausea be sent pt states she gets nauseous when taking antibiotics

## 2018-06-15 NOTE — Telephone Encounter (Signed)
Pt is asking for diflucan #5 to be sent to [facility] please advise thank you

## 2018-06-15 NOTE — Addendum Note (Signed)
Addended byShanon Ace K on: 06/15/2018 12:19 PM   Modules accepted: Orders

## 2018-07-07 ENCOUNTER — Other Ambulatory Visit: Payer: Self-pay

## 2018-07-07 ENCOUNTER — Encounter: Payer: Self-pay | Admitting: Adult Health

## 2018-07-07 MED ORDER — OMEPRAZOLE 40 MG PO CPDR: 40.0000 mg | DELAYED_RELEASE_CAPSULE | Freq: Every day | ORAL | 1 refills | Status: DC

## 2018-07-08 DIAGNOSIS — H6503 Acute serous otitis media, bilateral: Secondary | ICD-10-CM | POA: Diagnosis not present

## 2018-07-08 DIAGNOSIS — J302 Other seasonal allergic rhinitis: Secondary | ICD-10-CM | POA: Diagnosis not present

## 2018-07-09 ENCOUNTER — Other Ambulatory Visit: Payer: Self-pay

## 2018-07-09 MED ORDER — OMEPRAZOLE 40 MG PO CPDR: 40.0000 mg | DELAYED_RELEASE_CAPSULE | Freq: Every day | ORAL | 1 refills | Status: DC

## 2018-07-10 MED FILL — OMEPRAZOLE 40 MG CPDR: 40 | 90 days supply | Qty: 90 | Fill #0

## 2018-09-10 ENCOUNTER — Other Ambulatory Visit: Payer: Self-pay

## 2018-09-10 ENCOUNTER — Ambulatory Visit (INDEPENDENT_AMBULATORY_CARE_PROVIDER_SITE_OTHER): Payer: 59 | Admitting: Adult Health

## 2018-09-10 ENCOUNTER — Encounter: Payer: Self-pay | Admitting: Adult Health

## 2018-09-10 VITALS — BP 124/84 | HR 105 | Temp 97.8°F | Wt 195.0 lb

## 2018-09-10 DIAGNOSIS — B9789 Other viral agents as the cause of diseases classified elsewhere: Secondary | ICD-10-CM | POA: Diagnosis not present

## 2018-09-10 DIAGNOSIS — J069 Acute upper respiratory infection, unspecified: Secondary | ICD-10-CM

## 2018-09-10 NOTE — Progress Notes (Signed)
Subjective:    Patient ID: Lisa Hines, female    DOB: 1996-03-10, 23 y.o.   MRN: [id number]  HPI  23 year old female who  has a past medical history of Acute appendicitis with localized peritonitis (10/28/2014), ADD (attention deficit disorder), Family history of adverse reaction to anesthesia, History of Clostridium difficile colitis, Infectious mononucleosis (01/25/2014), Irregular menses (11/08/2010), Knee injury (04/19/2011), Myopia, Pes planus, and Tonsillar hypertrophy.  She presents to the office for an acute issue. Her symptoms started two days ago. Symptoms include that of intermittent shortness of breath, rhinorrhea, sinus congestion, intermittent wheezing, and semi productive cough. Denies fever or chills, nausea, vomiting, or diarrhea.   She takes Zyrtec daily.   Review of Systems   See HPI   Past Medical History:  Diagnosis Date  . Acute appendicitis with localized peritonitis 10/28/2014  . ADD (attention deficit disorder)   . Family history of adverse reaction to anesthesia    unknown---pt. is adopted  . History of Clostridium difficile colitis   . Infectious mononucleosis 01/25/2014   no obvious complication except she complains of severe pain in her throat not responsive to ibuprofen discussed  options pain medicines steroid risk benefit   . Irregular menses 11/08/2010  . Knee injury 04/19/2011   Poss traumatic bursitis tendinitis knee looks stable  .   Marland Kitchen Myopia   . Pes planus    consult [facility]  . Tonsillar hypertrophy     Social History   Socioeconomic History  . Marital status: Single    Spouse name: Not on file  . Number of children: Not on file  . Years of education: Not on file  . Highest education level: Not on file  Occupational History  . Not on file  Social Needs  . Financial resource strain: Not on file  . Food insecurity    Worry: Not on file    Inability: Not on file  . Transportation needs    Medical: Not on file    Non-medical: Not  on file  Tobacco Use  . Smoking status: Never Smoker  . Smokeless tobacco: Never Used  Substance and Sexual Activity  . Alcohol use: Yes    Alcohol/week: 0.0 standard drinks    Comment: ocassionally   . Drug use: No  . Sexual activity: Not on file  Lifestyle  . Physical activity    Days per week: Not on file    Minutes per session: Not on file  . Stress: Not on file  Relationships  . Social Herbalist on phone: Not on file    Gets together: Not on file    Attends religious service: Not on file    Active member of club or organization: Not on file    Attends meetings of clubs or organizations: Not on file    Relationship status: Not on file  . Intimate partner violence    Fear of current or ex partner: Not on file    Emotionally abused: Not on file    Physically abused: Not on file    Forced sexual activity: Not on file  Other Topics Concern  . Not on file  Social History Narrative   Caretaker verifies today that the child's current immunizations are up to date.   Child is adopted   Sib adopted   Active at school gymnastics, swimming, tennis and horseback riding seasonal now volleyball   Sleep about 9 hours or so   AL program  [employer]   ib courses   [employer] and lacrosse in past    Intact family       Past Surgical History:  Procedure Laterality Date  . APPENDECTOMY    . LAPAROSCOPIC APPENDECTOMY N/A 10/28/2014   Procedure: APPENDECTOMY LAPAROSCOPIC;  Surgeon: Excell Seltzer, MD;  Location: [facility];  Service: General;  Laterality: N/A;  . MYRINGOTOMY    . TONSILLECTOMY Bilateral 03/19/2017   Procedure: TONSILLECTOMY;  Surgeon: Melissa Montane, MD;  Location: [facility];  Service: ENT;  Laterality: Bilateral;    Family History  Adopted: Yes  Problem Relation Age of Onset  . ADD / ADHD Brother        possible ld    Allergies  Allergen Reactions  . Clindamycin Other (See Comments)    cdiff colitis  . Latex     Other  reaction(s): Other, Other (See Comments)  . Amoxicillin-Pot Clavulanate Rash  . Penicillin G Rash    Current Outpatient Medications on File Prior to Visit  Medication Sig Dispense Refill  . drospirenone-ethinyl estradiol (YAZ,GIANVI,LORYNA) 3-0.02 MG tablet Take 1 tablet by mouth daily.    . fluticasone (FLONASE) 50 MCG/ACT nasal spray Place 2 sprays into both nostrils daily. 16 g 12  . lisdexamfetamine (VYVANSE) 40 MG capsule Take 1 capsule (40 mg total) by mouth every morning. 90 capsule 0  . mupirocin cream (BACTROBAN) 2 % Apply 1 application topically 3 (three) times daily. 30 g 1  . omeprazole (PRILOSEC) 40 MG capsule Take 1 capsule (40 mg total) by mouth daily. 90 capsule 1   No current facility-administered medications on file prior to visit.     BP 124/84   Pulse (!) 105   Temp 97.8 F (36.6 C)   Wt 195 lb (88.5 kg) Comment: PT REPORTED  SpO2 96%   BMI 30.54 kg/m        Objective:   Physical Exam Vitals signs and nursing note reviewed.  Constitutional:      Appearance: Normal appearance.  HENT:     Head: Normocephalic.     Right Ear: Tympanic membrane, ear canal and external ear normal. There is no impacted cerumen.     Left Ear: Tympanic membrane, ear canal and external ear normal. There is no impacted cerumen.     Nose: Nose normal. No congestion or rhinorrhea.     Mouth/Throat:     Mouth: Mucous membranes are moist.     Pharynx: Oropharynx is clear.  Cardiovascular:     Rate and Rhythm: Normal rate and regular rhythm.     Pulses: Normal pulses.     Heart sounds: Normal heart sounds.  Pulmonary:     Effort: Pulmonary effort is normal.     Breath sounds: Normal breath sounds.  Skin:    General: Skin is warm and dry.     Capillary Refill: Capillary refill takes less than 2 seconds.  Neurological:     General: No focal deficit present.     Mental Status: She is alert and oriented to person, place, and time.  Psychiatric:        Mood and Affect: Mood  normal.        Behavior: Behavior normal.        Thought Content: Thought content normal.        Judgment: Judgment normal.       Assessment & Plan:  1. Viral URI with cough - Advised Flonase - Rest and Hydrate - Follow up  if no improvement in the next 2-3 days or sooner if symptoms develop.   Dorothyann Peng, NP

## 2018-09-14 ENCOUNTER — Telehealth: Payer: Self-pay | Admitting: *Deleted

## 2018-09-14 DIAGNOSIS — J069 Acute upper respiratory infection, unspecified: Secondary | ICD-10-CM

## 2018-09-14 MED ORDER — BENZONATATE 200 MG PO CAPS: 200.0000 mg | ORAL_CAPSULE | Freq: Three times a day (TID) | ORAL | 1 refills | Status: DC | PRN

## 2018-09-14 NOTE — Telephone Encounter (Signed)
Patient called and reports her rx for Benzonatate 200mg  has not been called in. [facility] [address] [facility] [address]

## 2018-09-14 NOTE — Telephone Encounter (Signed)
rx sent in 

## 2018-10-17 DIAGNOSIS — H5213 Myopia, bilateral: Secondary | ICD-10-CM | POA: Diagnosis not present

## 2018-10-26 ENCOUNTER — Other Ambulatory Visit: Payer: Self-pay

## 2018-10-26 DIAGNOSIS — Z20822 Contact with and (suspected) exposure to covid-19: Secondary | ICD-10-CM

## 2018-10-27 LAB — NOVEL CORONAVIRUS, NAA: SARS-CoV-2, NAA: NOT DETECTED

## 2018-10-27 MED FILL — OMEPRAZOLE DR 40 MG CAPSULE: 40 | 90 days supply | Qty: 90 | Fill #0

## 2018-12-08 DIAGNOSIS — Z20828 Contact with and (suspected) exposure to other viral communicable diseases: Secondary | ICD-10-CM | POA: Diagnosis not present

## 2018-12-08 DIAGNOSIS — U071 COVID-19: Secondary | ICD-10-CM | POA: Diagnosis not present

## 2019-01-06 ENCOUNTER — Telehealth: Payer: Self-pay

## 2019-01-06 NOTE — Telephone Encounter (Signed)
Leave of absence Form has been placed in red folder to be filled out and signed. Please advise

## 2019-01-06 NOTE — Telephone Encounter (Signed)
Entered in error

## 2019-01-07 ENCOUNTER — Encounter: Payer: Self-pay | Admitting: Internal Medicine

## 2019-01-07 ENCOUNTER — Other Ambulatory Visit: Payer: Self-pay

## 2019-01-07 ENCOUNTER — Telehealth: Payer: 59 | Admitting: Internal Medicine

## 2019-01-07 DIAGNOSIS — Z9189 Other specified personal risk factors, not elsewhere classified: Secondary | ICD-10-CM

## 2019-01-07 DIAGNOSIS — Z634 Disappearance and death of family member: Secondary | ICD-10-CM

## 2019-01-07 DIAGNOSIS — Z560 Unemployment, unspecified: Secondary | ICD-10-CM

## 2019-01-07 NOTE — Progress Notes (Signed)
Virtual Visit via Video Note  I connected with@ on 01/07/19 at 11:00 AM EDT by a video enabled telemedicine application and verified that I am speaking with the correct person using two identifiers. Location patient: home Location provider:or home office Persons participating in the virtual visit: patient, provider  [place] national recommendations  regarding COVID 19 pandemic   video visit is advised over in office visit for this patient.  Patient aware  of the limitations of evaluation and management by telemedicine and  availability of in person appointments. and agreed to proceed.   HPI: Lisa Hines presents for video visit Her employer is requiring an FMLA form for time she is missed for exposure to Covid and other Covid testing this semester.  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 There were potential exposures at different times.  She has not had Covid documented.  Last week her brother was found deceased suddenly and if she is at home before funeral services.  She has decided to take the rest of the semester off or take and complete at this time. If needed there will be another form to fill out for bereavement.  She may need a refill of her Vyvanse next month.    ROS: See pertinent positives and negatives per HPI.  Past Medical History:  Diagnosis Date  . Acute appendicitis with localized peritonitis 10/28/2014  . ADD (attention deficit disorder)   . Family history of adverse reaction to anesthesia    unknown---pt. is adopted  . History of Clostridium difficile colitis   . Infectious mononucleosis 01/25/2014   no obvious complication except she complains of  severe pain in her throat not responsive to ibuprofen discussed  options pain medicines steroid risk benefit   . Irregular menses 11/08/2010  . Knee injury 04/19/2011   Poss traumatic bursitis tendinitis knee looks stable  .   Marland Kitchen Myopia   . Pes planus    consult [facility]  . Tonsillar hypertrophy     Past Surgical History:  Procedure Laterality Date  . APPENDECTOMY    . LAPAROSCOPIC APPENDECTOMY N/A 10/28/2014   Procedure: APPENDECTOMY LAPAROSCOPIC;  Surgeon: Excell Seltzer, MD;  Location: [facility];  Service: General;  Laterality: N/A;  . MYRINGOTOMY    . TONSILLECTOMY Bilateral 03/19/2017   Procedure: TONSILLECTOMY;  Surgeon: Melissa Montane, MD;  Location: [place];  Service: ENT;  Laterality: Bilateral;    Family History  Adopted: Yes  Problem Relation Age of Onset  . ADD / ADHD Brother        possible ld    Social History   Tobacco Use  . Smoking status: Never Smoker  . Smokeless tobacco: Never Used  Substance Use Topics  . Alcohol use: Yes    Alcohol/week: 0.0 standard drinks    Comment: ocassionally   . Drug use: No      Current Outpatient Medications:  .  benzonatate (TESSALON) 200 MG capsule, Take 1 capsule (200 mg total) by mouth 3 (three) times daily as needed for cough., Disp: 30 capsule, Rfl: 1 .  drospirenone-ethinyl estradiol (YAZ,GIANVI,LORYNA) 3-0.02 MG tablet, Take 1 tablet by mouth daily., Disp: ,  Rfl:  .  fluticasone (FLONASE) 50 MCG/ACT nasal spray, Place 2 sprays into both nostrils daily., Disp: 16 g, Rfl: 12 .  lisdexamfetamine (VYVANSE) 40 MG capsule, Take 1 capsule (40 mg total) by mouth every morning., Disp: 90 capsule, Rfl: 0 .  mupirocin cream (BACTROBAN) 2 %, Apply 1 application topically 3 (three) times daily., Disp: 30 g, Rfl: 1 .  omeprazole (PRILOSEC) 40 MG capsule, Take 1 capsule (40 mg total) by mouth daily., Disp: 90 capsule, Rfl: 1  EXAM: BP Readings from Last 3 Encounters:  09/10/18 124/84  06/12/18 91/64  03/24/18 116/78     VITALS per patient if applicable:  GENERAL: alert, oriented, appears well and in no acute distress  HEENT: atraumatic, conjunttiva clear, no obvious abnormalities on inspection of external nose and ears  NECK: normal movements of the head and neck  LUNGS: on inspection no signs of respiratory distress, breathing rate appears normal, no obvious gross SOB, gasping or wheezing  CV: no obvious cyanosis  MS: moves all visible extremities without noticeable abnormality  PSYCH/NEURO: pleasant and cooperative, no obvious depression or anxiety, speech and thought processing grossly intact Lab Results  Component Value Date   WBC 9.7 03/24/2018   HGB 14.9 03/24/2018   HCT 43.8 03/24/2018   PLT 381.0 03/24/2018   GLUCOSE 84 03/24/2018   CHOL 191 03/24/2018   TRIG 241.0 (H) 03/24/2018   HDL 44.20 03/24/2018   LDLDIRECT 125.0 03/24/2018   ALT 17 03/24/2018   AST 20 03/24/2018   NA 136 03/24/2018   K 4.3 03/24/2018   CL 103 03/24/2018   CREATININE 0.74 03/24/2018   BUN 11 03/24/2018   CO2 23 03/24/2018   TSH 2.16 03/24/2018   HGBA1C 5.3 03/24/2018    ASSESSMENT AND PLAN:  Discussed the following assessment and plan:    ICD-10-CM   1. At increased risk of exposure to COVID-19 virus  Z91.89   2. Out of work  Z56.0   3. Death of family member  Z63.4    Condolences on the loss of her brother fill out form regard to an Covid exposure erratic ability to be at work. Counseled.   Expectant management and discussion of plan and treatment with opportunity to ask questions and all were answered. The patient agreed with the plan and demonstrated an understanding of the instructions.   Advised to call back or seek an in-person evaluation if worsening  or having  further concerns . In interim   I Shanon Ace, MD

## 2019-01-07 NOTE — Telephone Encounter (Signed)
Copied from [place] [phone]. Topic: Appointment Scheduling - Scheduling Inquiry for Clinic >> Jan 07, 2019 10:45 AM Lisa Hines wrote: Reason for CRM:  Pt states she is calling in regards to the MyChart message telling her that additional information is needed.  Pt needs to schedule virtual today.  Tried office, no answer.

## 2019-01-07 NOTE — Telephone Encounter (Signed)
Tried calling patient to set up virtual or telephone to talk to doctor panosh about form

## 2019-01-15 DIAGNOSIS — F4322 Adjustment disorder with anxiety: Secondary | ICD-10-CM | POA: Diagnosis not present

## 2019-01-15 DIAGNOSIS — F4323 Adjustment disorder with mixed anxiety and depressed mood: Secondary | ICD-10-CM | POA: Diagnosis not present

## 2019-01-19 DIAGNOSIS — F4322 Adjustment disorder with anxiety: Secondary | ICD-10-CM | POA: Diagnosis not present

## 2019-01-19 DIAGNOSIS — F4323 Adjustment disorder with mixed anxiety and depressed mood: Secondary | ICD-10-CM | POA: Diagnosis not present

## 2019-02-01 ENCOUNTER — Other Ambulatory Visit: Payer: Self-pay

## 2019-02-01 MED ORDER — OMEPRAZOLE 40 MG PO CPDR: 40.0000 mg | DELAYED_RELEASE_CAPSULE | Freq: Every day | ORAL | 1 refills | Status: DC

## 2019-02-01 MED FILL — OMEPRAZOLE DR 40 MG CAPSULE: 40 | 90 days supply | Qty: 90 | Fill #0

## 2019-02-15 ENCOUNTER — Other Ambulatory Visit: Payer: Self-pay

## 2019-02-15 ENCOUNTER — Telehealth: Payer: Self-pay

## 2019-02-15 ENCOUNTER — Other Ambulatory Visit: Payer: 59

## 2019-02-15 DIAGNOSIS — Z0184 Encounter for antibody response examination: Secondary | ICD-10-CM

## 2019-02-15 NOTE — Telephone Encounter (Signed)
Agree order  Placed

## 2019-02-15 NOTE — Telephone Encounter (Signed)
Pt would like to have covid antibody test please advise

## 2019-02-16 DIAGNOSIS — F4323 Adjustment disorder with mixed anxiety and depressed mood: Secondary | ICD-10-CM | POA: Diagnosis not present

## 2019-02-16 DIAGNOSIS — F4322 Adjustment disorder with anxiety: Secondary | ICD-10-CM | POA: Diagnosis not present

## 2019-02-16 LAB — SAR COV2 SEROLOGY (COVID19)AB(IGG),IA: SARS CoV2 AB IGG: POSITIVE — AB

## 2019-03-06 DIAGNOSIS — F4322 Adjustment disorder with anxiety: Secondary | ICD-10-CM | POA: Diagnosis not present

## 2019-03-06 DIAGNOSIS — F4323 Adjustment disorder with mixed anxiety and depressed mood: Secondary | ICD-10-CM | POA: Diagnosis not present

## 2019-03-15 ENCOUNTER — Ambulatory Visit (INDEPENDENT_AMBULATORY_CARE_PROVIDER_SITE_OTHER): Payer: 59 | Admitting: Podiatry

## 2019-03-15 ENCOUNTER — Encounter: Payer: Self-pay | Admitting: Podiatry

## 2019-03-15 ENCOUNTER — Other Ambulatory Visit: Payer: Self-pay

## 2019-03-15 VITALS — BP 104/73 | HR 99 | Temp 96.9°F

## 2019-03-15 DIAGNOSIS — M79674 Pain in right toe(s): Secondary | ICD-10-CM

## 2019-03-15 DIAGNOSIS — L600 Ingrowing nail: Secondary | ICD-10-CM

## 2019-03-15 MED ORDER — MUPIROCIN 2 % EX OINT: 1.0000 "application " | TOPICAL_OINTMENT | Freq: Two times a day (BID) | CUTANEOUS | 2 refills | Status: DC

## 2019-03-15 MED FILL — MUPIROCIN 2% OINTMENT: 2 | 20 days supply | Qty: 22 | Fill #0

## 2019-03-15 NOTE — Patient Instructions (Signed)

## 2019-03-17 ENCOUNTER — Other Ambulatory Visit: Payer: Self-pay

## 2019-03-18 MED ORDER — LISDEXAMFETAMINE DIMESYLATE 40 MG PO CAPS
40.0000 mg | ORAL_CAPSULE | ORAL | 0 refills | Status: DC
Start: 2019-03-18 — End: 2019-08-05

## 2019-03-18 MED FILL — VYVANSE 40 MG CAPSULE: 40 | 90 days supply | Qty: 90 | Fill #0

## 2019-03-18 NOTE — Progress Notes (Signed)
Subjective:   Patient ID: Lisa Hines, female   DOB: 23 y.o.   MRN: [id number]   HPI 23 year old female presents the office today for concerns of ingrown toe of the right big toe, medial aspect was in on the last 2 to 3 months.  She states that tender to touch this with wearing shoes.  Denies any redness or drainage or any swelling.  She did treatment about 2 weeks ago and blood and pus came out and has been somewhat better but still tender.  No other concerns today.   Review of Systems  All other systems reviewed and are negative.  Past Medical History:  Diagnosis Date  . Acute appendicitis with localized peritonitis 10/28/2014  . ADD (attention deficit disorder)   . Family history of adverse reaction to anesthesia    unknown---pt. is adopted  . History of Clostridium difficile colitis   . Infectious mononucleosis 01/25/2014   no obvious complication except she complains of severe pain in her throat not responsive to ibuprofen discussed  options pain medicines steroid risk benefit   . Irregular menses 11/08/2010  . Knee injury 04/19/2011   Poss traumatic bursitis tendinitis knee looks stable  .   Marland Kitchen Myopia   . Pes planus    consult [facility]  . Tonsillar hypertrophy     Past Surgical History:  Procedure Laterality Date  . APPENDECTOMY    . LAPAROSCOPIC APPENDECTOMY N/A 10/28/2014   Procedure: APPENDECTOMY LAPAROSCOPIC;  Surgeon: Excell Seltzer, MD;  Location: [facility];  Service: General;  Laterality: N/A;  . MYRINGOTOMY    . TONSILLECTOMY Bilateral 03/19/2017   Procedure: TONSILLECTOMY;  Surgeon: Melissa Montane, MD;  Location: [place];  Service: ENT;  Laterality: Bilateral;     Current Outpatient Medications:  .  drospirenone-ethinyl estradiol (YAZ,GIANVI,LORYNA) 3-0.02 MG tablet, Take 1 tablet by mouth daily., Disp: , Rfl:  .  omeprazole (PRILOSEC) 40 MG capsule, Take 1 capsule (40 mg total) by mouth daily., Disp: 90 capsule, Rfl: 1 .  lisdexamfetamine  (VYVANSE) 40 MG capsule, Take 1 capsule (40 mg total) by mouth every morning., Disp: 90 capsule, Rfl: 0 .  mupirocin ointment (BACTROBAN) 2 %, Apply 1 application topically 2 (two) times daily., Disp: 30 g, Rfl: 2  Allergies  Allergen Reactions  . Clindamycin Other (See Comments)    cdiff colitis  . Latex     Other reaction(s): Other, Other (See Comments)  . Amoxicillin-Pot Clavulanate Rash  . Penicillin G Rash          Objective:  Physical Exam  General: AAO x3, NAD  Dermatological: Incurvation present to right medial hallux nail border with tenderness palpation.  There is no drainage or pus.  Minimal edema and faint erythema likely more from inflammation as opposed to infection.  There is no warmth. No fluctuation or crepitation.  There is no malodor.  Vascular: Dorsalis Pedis artery and Posterior Tibial artery pedal pulses are 2/4 bilateral with immedate capillary fill time. There is no pain with calf compression, swelling, warmth, erythema.   Neruologic: Grossly intact via light touch bilateral. Musculoskeletal: No gross boney pedal deformities bilateral. No pain, crepitus, or limitation noted with foot and ankle range of motion bilateral. Muscular strength 5/5 in all groups tested bilateral.  Gait: Unassisted, Nonantalgic.       Assessment:   Great toenail right medial hallux nail border     Plan:  -Treatment options discussed including all alternatives, risks, and complications -Etiology of symptoms were discussed -Partial  nail avulsion with chemical matricectomy but she wishes to hold off today.  Recommend Epson salt soaks daily as well as antibiotic ointment dressing changes.  We will reappoint for partial nail avulsion with chemical matricectomy at her convenience.   Trula Slade DPM

## 2019-03-18 NOTE — Telephone Encounter (Signed)
Last rx given on 03/24/2018 for #90 with no ref

## 2019-03-18 NOTE — Telephone Encounter (Signed)
Message routed to PCP CMA  

## 2019-03-23 ENCOUNTER — Encounter: Payer: Self-pay | Admitting: Adult Health

## 2019-03-25 DIAGNOSIS — F4323 Adjustment disorder with mixed anxiety and depressed mood: Secondary | ICD-10-CM | POA: Diagnosis not present

## 2019-03-25 DIAGNOSIS — F4322 Adjustment disorder with anxiety: Secondary | ICD-10-CM | POA: Diagnosis not present

## 2019-03-26 ENCOUNTER — Other Ambulatory Visit: Payer: Self-pay

## 2019-03-26 ENCOUNTER — Ambulatory Visit: Payer: 59 | Admitting: Podiatry

## 2019-03-26 ENCOUNTER — Encounter: Payer: Self-pay | Admitting: Podiatry

## 2019-03-26 DIAGNOSIS — L600 Ingrowing nail: Secondary | ICD-10-CM

## 2019-03-26 NOTE — Progress Notes (Signed)
Subjective: 24 year old female presents the office today for concerns of continued pain on the right big toe, ingrown toenail.  She states that it is still red and sore although she has been soaking Epson salts and this helps some.  Denies any drainage or pus. Denies any systemic complaints such as fevers, chills, nausea, vomiting. No acute changes since last appointment, and no other complaints at this time.   Objective: AAO x3, NAD DP/PT pulses palpable bilaterally, CRT less than 3 seconds Incurvation present to both the medial aspect of the right hallux toenail with tenderness palpation there is localized edema erythema.  There is no drainage or pus.  No ascending cellulitis. No pain with calf compression, swelling, warmth, erythema  Assessment: Right hallux ingrown toenail  Plan: -All treatment options discussed with the patient including all alternatives, risks, complications.  -At this time, the patient is requesting partial nail removal with chemical matricectomy to the symptomatic portion of the nail. Risks and complications were discussed with the patient for which they understand and written consent was obtained. Under sterile conditions a total of 3 mL of a mixture of 2% lidocaine plain and 0.5% Marcaine plain was infiltrated in a hallux block fashion. Once anesthetized, the skin was prepped in sterile fashion. A tourniquet was then applied. Next the medial/lateral aspect of hallux nail border was then sharply excised making sure to remove the entire offending nail border. Once the nails were ensured to be removed area was debrided and the underlying skin was intact. There is no purulence identified in the procedure. Next phenol was then applied under standard conditions and copiously irrigated. Silvadene was applied. A dry sterile dressing was applied. After application of the dressing the tourniquet was removed and there is found to be an immediate capillary refill time to the digit. The  patient tolerated the procedure well any complications. Post procedure instructions were discussed the patient for which he verbally understood. Follow-up in one week for nail check or sooner if any problems are to arise. Discussed signs/symptoms of infection and directed to call the office immediately should any occur or go directly to the emergency room. In the meantime, encouraged to call the office with any questions, concerns, changes symptoms. -Patient encouraged to call the office with any questions, concerns, change in symptoms.   *She is going back to college this weekend.  We will do a virtual visit for her follow-up.  Return in about 2 weeks (around 04/09/2019) for nail check-right big toe.  Trula Slade DPM

## 2019-03-26 NOTE — Patient Instructions (Signed)

## 2019-04-06 DIAGNOSIS — F4322 Adjustment disorder with anxiety: Secondary | ICD-10-CM | POA: Diagnosis not present

## 2019-04-06 DIAGNOSIS — F4323 Adjustment disorder with mixed anxiety and depressed mood: Secondary | ICD-10-CM | POA: Diagnosis not present

## 2019-04-07 DIAGNOSIS — Z20828 Contact with and (suspected) exposure to other viral communicable diseases: Secondary | ICD-10-CM | POA: Diagnosis not present

## 2019-04-07 DIAGNOSIS — Z03818 Encounter for observation for suspected exposure to other biological agents ruled out: Secondary | ICD-10-CM | POA: Diagnosis not present

## 2019-04-08 ENCOUNTER — Other Ambulatory Visit: Payer: Self-pay

## 2019-04-08 ENCOUNTER — Encounter: Payer: Self-pay | Admitting: Podiatry

## 2019-04-08 ENCOUNTER — Telehealth (INDEPENDENT_AMBULATORY_CARE_PROVIDER_SITE_OTHER): Payer: Self-pay | Admitting: Podiatry

## 2019-04-08 DIAGNOSIS — L600 Ingrowing nail: Secondary | ICD-10-CM

## 2019-04-12 NOTE — Progress Notes (Signed)
Virtual Visit via Video Note  I connected with Lisa Hines on 04/07/2018 at  5:00 PM EST by a video enabled telemedicine application and verified that I am speaking with the correct person using two identifiers.  Location: Patient: Home Provider: Office   I discussed the limitations of evaluation and management by telemedicine and the availability of in person appointments. The patient expressed understanding and agreed to proceed.  History of Present Illness: 24 year old female recently underwent right partial nail avulsion with chemical matricectomy.  She states that she is doing well and she has been soaking Epson salts, antibiotic ointment and a dressing daily.  She denies any drainage or pus or any pain.  She feels the area is doing well.  She is currently in quarantine given her room and having COVID. She is on day #3.   Observations/Objective: Not able to appreciate any drainage or pus coming from the area and there is no redness.  No red streaks.  There is no pain.  Assessment and Plan: Status post partial nail avulsion with chemical matricectomy  Continue soaking Epson salt soaks twice a day covered with antibiotic ointment and a bandage of the day.  Can leave the area open at night.  Monitor closely for any signs or symptoms of infection.  Follow Up Instructions: Follow-up as needed.    I discussed the assessment and treatment plan with the patient. The patient was provided an opportunity to ask questions and all were answered. The patient agreed with the plan and demonstrated an understanding of the instructions.   The patient was advised to call back or seek an in-person evaluation if the symptoms worsen or if the condition fails to improve as anticipated.  I provided 6 minutes of non-face-to-face time during this encounter.   Trula Slade, DPM

## 2019-04-13 DIAGNOSIS — Z03818 Encounter for observation for suspected exposure to other biological agents ruled out: Secondary | ICD-10-CM | POA: Diagnosis not present

## 2019-04-13 DIAGNOSIS — Z20828 Contact with and (suspected) exposure to other viral communicable diseases: Secondary | ICD-10-CM | POA: Diagnosis not present

## 2019-04-16 DIAGNOSIS — F4323 Adjustment disorder with mixed anxiety and depressed mood: Secondary | ICD-10-CM | POA: Diagnosis not present

## 2019-04-16 DIAGNOSIS — F4322 Adjustment disorder with anxiety: Secondary | ICD-10-CM | POA: Diagnosis not present

## 2019-04-23 DIAGNOSIS — F4323 Adjustment disorder with mixed anxiety and depressed mood: Secondary | ICD-10-CM | POA: Diagnosis not present

## 2019-04-23 DIAGNOSIS — F4322 Adjustment disorder with anxiety: Secondary | ICD-10-CM | POA: Diagnosis not present

## 2019-04-24 DIAGNOSIS — E7800 Pure hypercholesterolemia, unspecified: Secondary | ICD-10-CM

## 2019-04-24 DIAGNOSIS — R6889 Other general symptoms and signs: Secondary | ICD-10-CM

## 2019-04-24 DIAGNOSIS — Z0184 Encounter for antibody response examination: Secondary | ICD-10-CM

## 2019-04-24 DIAGNOSIS — Z79899 Other long term (current) drug therapy: Secondary | ICD-10-CM

## 2019-04-26 NOTE — Telephone Encounter (Signed)
So need  more information about request   To be able to order correctly .  What symptoms are you having  Or concerns  As to why requesting testing  ( ie  To help with diagnosing code )   We may want to update your cholesterol panel  Also since was up last time .   Can do fasting blood work for Friday  After above infor,mation so I can place orders

## 2019-04-27 NOTE — Telephone Encounter (Signed)
So I placed orders for thyroid cholesterol and anemia low iron ( can cause cold  Intolerance)  And the covid antibody but  The  Results dont guarantee  Or define  Immunity .  So continue to self isolate before trips meeting  and mask  And distance   Tab can you make Raegin a  Lab appt for Friday?  Prefer fasting  Before lab  Draw .  thanks

## 2019-04-30 ENCOUNTER — Other Ambulatory Visit: Payer: 59

## 2019-04-30 DIAGNOSIS — F4322 Adjustment disorder with anxiety: Secondary | ICD-10-CM | POA: Diagnosis not present

## 2019-04-30 DIAGNOSIS — F4323 Adjustment disorder with mixed anxiety and depressed mood: Secondary | ICD-10-CM | POA: Diagnosis not present

## 2019-05-10 ENCOUNTER — Encounter: Payer: Self-pay | Admitting: Podiatry

## 2019-05-10 MED FILL — OMEPRAZOLE DR 40 MG CAPSULE: 40 | 90 days supply | Qty: 90 | Fill #1

## 2019-05-11 DIAGNOSIS — F4322 Adjustment disorder with anxiety: Secondary | ICD-10-CM | POA: Diagnosis not present

## 2019-05-11 DIAGNOSIS — F4323 Adjustment disorder with mixed anxiety and depressed mood: Secondary | ICD-10-CM | POA: Diagnosis not present

## 2019-05-14 DIAGNOSIS — L600 Ingrowing nail: Secondary | ICD-10-CM | POA: Diagnosis not present

## 2019-05-14 DIAGNOSIS — M79675 Pain in left toe(s): Secondary | ICD-10-CM | POA: Diagnosis not present

## 2019-05-18 ENCOUNTER — Other Ambulatory Visit: Payer: Self-pay

## 2019-05-18 DIAGNOSIS — F4322 Adjustment disorder with anxiety: Secondary | ICD-10-CM | POA: Diagnosis not present

## 2019-05-18 DIAGNOSIS — F4323 Adjustment disorder with mixed anxiety and depressed mood: Secondary | ICD-10-CM | POA: Diagnosis not present

## 2019-05-19 ENCOUNTER — Other Ambulatory Visit (INDEPENDENT_AMBULATORY_CARE_PROVIDER_SITE_OTHER): Payer: 59

## 2019-05-19 ENCOUNTER — Other Ambulatory Visit: Payer: Self-pay

## 2019-05-19 DIAGNOSIS — E7800 Pure hypercholesterolemia, unspecified: Secondary | ICD-10-CM | POA: Diagnosis not present

## 2019-05-19 DIAGNOSIS — Z0184 Encounter for antibody response examination: Secondary | ICD-10-CM | POA: Diagnosis not present

## 2019-05-19 DIAGNOSIS — R6889 Other general symptoms and signs: Secondary | ICD-10-CM

## 2019-05-19 DIAGNOSIS — Z79899 Other long term (current) drug therapy: Secondary | ICD-10-CM

## 2019-05-19 LAB — CBC WITH DIFFERENTIAL/PLATELET
Basophils Absolute: 0 10*3/uL (ref 0.0–0.1)
Basophils Relative: 0.7 % (ref 0.0–3.0)
Eosinophils Absolute: 0.3 10*3/uL (ref 0.0–0.7)
Eosinophils Relative: 5.6 % — ABNORMAL HIGH (ref 0.0–5.0)
HCT: 40.8 % (ref 36.0–46.0)
Hemoglobin: 14 g/dL (ref 12.0–15.0)
Lymphocytes Relative: 32 % (ref 12.0–46.0)
Lymphs Abs: 1.8 10*3/uL (ref 0.7–4.0)
MCHC: 34.3 g/dL (ref 30.0–36.0)
MCV: 87.5 fl (ref 78.0–100.0)
Monocytes Absolute: 0.5 10*3/uL (ref 0.1–1.0)
Monocytes Relative: 8.1 % (ref 3.0–12.0)
Neutro Abs: 3 10*3/uL (ref 1.4–7.7)
Neutrophils Relative %: 53.6 % (ref 43.0–77.0)
Platelets: 338 10*3/uL (ref 150.0–400.0)
RBC: 4.66 Mil/uL (ref 3.87–5.11)
RDW: 12.8 % (ref 11.5–15.5)
WBC: 5.7 10*3/uL (ref 4.0–10.5)

## 2019-05-19 LAB — LIPID PANEL
Cholesterol: 169 mg/dL (ref 0–200)
HDL: 38.5 mg/dL — ABNORMAL LOW (ref >39.00)
NonHDL: 130.98
Total CHOL/HDL Ratio: 4
Triglycerides: 215 mg/dL — ABNORMAL HIGH (ref 0.0–149.0)
VLDL: 43 mg/dL — ABNORMAL HIGH (ref 0.0–40.0)

## 2019-05-19 LAB — BASIC METABOLIC PANEL
BUN: 10 mg/dL (ref 6–23)
CO2: 23 mEq/L (ref 19–32)
Calcium: 9.5 mg/dL (ref 8.4–10.5)
Chloride: 106 mEq/L (ref 96–112)
Creatinine, Ser: 0.65 mg/dL (ref 0.40–1.20)
GFR: 114.01 mL/min (ref >60.00)
Glucose, Bld: 105 mg/dL — ABNORMAL HIGH (ref 70–99)
Potassium: 4.6 mEq/L (ref 3.5–5.1)
Sodium: 137 mEq/L (ref 135–145)

## 2019-05-19 LAB — TSH: TSH: 1.52 u[IU]/mL (ref 0.35–4.50)

## 2019-05-19 LAB — IBC + FERRITIN
Ferritin: 15.1 ng/mL (ref 10.0–291.0)
Iron: 27 ug/dL — ABNORMAL LOW (ref 42–145)
Saturation Ratios: 4.9 % — ABNORMAL LOW (ref 20.0–50.0)
Transferrin: 396 mg/dL — ABNORMAL HIGH (ref 212.0–360.0)

## 2019-05-19 LAB — LDL CHOLESTEROL, DIRECT: Direct LDL: 108 mg/dL

## 2019-05-19 LAB — T4, FREE: Free T4: 0.83 ng/dL (ref 0.60–1.60)

## 2019-05-20 LAB — SARS COV-2 SEROLOGY(COVID-19)AB(IGG,IGM),IMMUNOASSAY
SARS CoV-2 AB IgG: POSITIVE — AB
SARS CoV-2 IgM: NEGATIVE

## 2019-05-20 LAB — THYROID ANTIBODIES
Thyroglobulin Ab: <1 IU/mL (ref <=1)
Thyroperoxidase Ab SerPl-aCnc: 1 IU/mL (ref <9)

## 2019-05-20 MED FILL — buPROPion HCL ER (XL) 150 M: 150 | 30 days supply | Qty: 30 | Fill #0

## 2019-05-21 NOTE — Progress Notes (Signed)
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

## 2019-05-25 DIAGNOSIS — F4322 Adjustment disorder with anxiety: Secondary | ICD-10-CM | POA: Diagnosis not present

## 2019-05-25 DIAGNOSIS — F4323 Adjustment disorder with mixed anxiety and depressed mood: Secondary | ICD-10-CM | POA: Diagnosis not present

## 2019-06-03 DIAGNOSIS — F4323 Adjustment disorder with mixed anxiety and depressed mood: Secondary | ICD-10-CM | POA: Diagnosis not present

## 2019-06-03 DIAGNOSIS — F4322 Adjustment disorder with anxiety: Secondary | ICD-10-CM | POA: Diagnosis not present

## 2019-06-07 DIAGNOSIS — Z20828 Contact with and (suspected) exposure to other viral communicable diseases: Secondary | ICD-10-CM | POA: Diagnosis not present

## 2019-06-07 DIAGNOSIS — Z03818 Encounter for observation for suspected exposure to other biological agents ruled out: Secondary | ICD-10-CM | POA: Diagnosis not present

## 2019-06-08 DIAGNOSIS — F4323 Adjustment disorder with mixed anxiety and depressed mood: Secondary | ICD-10-CM | POA: Diagnosis not present

## 2019-06-08 DIAGNOSIS — F4322 Adjustment disorder with anxiety: Secondary | ICD-10-CM | POA: Diagnosis not present

## 2019-06-14 DIAGNOSIS — Z03818 Encounter for observation for suspected exposure to other biological agents ruled out: Secondary | ICD-10-CM | POA: Diagnosis not present

## 2019-06-14 DIAGNOSIS — Z20828 Contact with and (suspected) exposure to other viral communicable diseases: Secondary | ICD-10-CM | POA: Diagnosis not present

## 2019-06-22 MED FILL — buPROPion HCL ER (XL) 150 M: 150 | 30 days supply | Qty: 30 | Fill #1

## 2019-07-02 DIAGNOSIS — F4323 Adjustment disorder with mixed anxiety and depressed mood: Secondary | ICD-10-CM | POA: Diagnosis not present

## 2019-07-02 DIAGNOSIS — F4322 Adjustment disorder with anxiety: Secondary | ICD-10-CM | POA: Diagnosis not present

## 2019-07-15 DIAGNOSIS — F4323 Adjustment disorder with mixed anxiety and depressed mood: Secondary | ICD-10-CM | POA: Diagnosis not present

## 2019-07-15 DIAGNOSIS — F4322 Adjustment disorder with anxiety: Secondary | ICD-10-CM | POA: Diagnosis not present

## 2019-07-28 DIAGNOSIS — F4323 Adjustment disorder with mixed anxiety and depressed mood: Secondary | ICD-10-CM | POA: Diagnosis not present

## 2019-07-28 DIAGNOSIS — F4322 Adjustment disorder with anxiety: Secondary | ICD-10-CM | POA: Diagnosis not present

## 2019-08-05 ENCOUNTER — Other Ambulatory Visit: Payer: Self-pay

## 2019-08-06 NOTE — Telephone Encounter (Signed)
Last OV 01/07/2019 (Virtual Visit)  Last filled 03/18/2019, # 90 with 0 refills

## 2019-08-09 MED ORDER — LISDEXAMFETAMINE DIMESYLATE 40 MG PO CAPS: 40.0000 mg | ORAL_CAPSULE | ORAL | 0 refills | Status: DC

## 2019-08-09 MED FILL — VYVANSE 40 MG CAPSULE: 40 | 90 days supply | Qty: 90 | Fill #0

## 2019-08-30 DIAGNOSIS — F4322 Adjustment disorder with anxiety: Secondary | ICD-10-CM | POA: Diagnosis not present

## 2019-08-30 DIAGNOSIS — F4323 Adjustment disorder with mixed anxiety and depressed mood: Secondary | ICD-10-CM | POA: Diagnosis not present

## 2019-08-30 MED FILL — buPROPion HCL ER (XL) 150 M: 150 | 30 days supply | Qty: 30 | Fill #3

## 2019-09-01 ENCOUNTER — Telehealth (INDEPENDENT_AMBULATORY_CARE_PROVIDER_SITE_OTHER): Payer: 59 | Admitting: Internal Medicine

## 2019-09-01 ENCOUNTER — Encounter: Payer: Self-pay | Admitting: Internal Medicine

## 2019-09-01 ENCOUNTER — Other Ambulatory Visit: Payer: Self-pay

## 2019-09-01 VITALS — Temp 97.8°F | Ht 67.0 in | Wt 190.0 lb

## 2019-09-01 DIAGNOSIS — F988 Other specified behavioral and emotional disorders with onset usually occurring in childhood and adolescence: Secondary | ICD-10-CM

## 2019-09-01 DIAGNOSIS — Z79899 Other long term (current) drug therapy: Secondary | ICD-10-CM | POA: Diagnosis not present

## 2019-09-01 DIAGNOSIS — R12 Heartburn: Secondary | ICD-10-CM

## 2019-09-01 MED ORDER — OMEPRAZOLE 40 MG PO CPDR: 40.0000 mg | DELAYED_RELEASE_CAPSULE | Freq: Every day | ORAL | 1 refills | Status: DC

## 2019-09-01 MED FILL — OMEPRAZOLE 40 MG CPDR: 40 | 90 days supply | Qty: 90 | Fill #0

## 2019-09-01 NOTE — Progress Notes (Signed)
Virtual Visit via Video Note  I connected with@ on 09/01/19 at 12:30 PM EDT by a video enabled telemedicine application and verified that I am speaking with the correct person using two identifiers. Location patient: work  [employer]  office Persons participating in the virtual visit: patient, provider  [place] national recommendations  regarding COVID 19 pandemic   video visit is advised over in office visit for this patient.  Patient aware  of the limitations of evaluation and management by telemedicine and  availability of in person appointments. and agreed to proceed.   HPI: Lisa Hines presents for video visit Med evaluation  For omeprazole  Has been taking for a while  Takes at night before bed  With other meds to prevent rx  Burning heart burn at night   Helps  If skips day gets s the afternoon of the next day  Notes work is has etoh 2-3 on weekends so better with limiting etoh. 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 No vomiting diarrhea  Remote hx of c diff  Had moderna covid vaccine recently utd   Past Medical History:  Diagnosis Date  . Acute appendicitis with localized peritonitis 10/28/2014  . ADD (attention deficit disorder)   . Family history of adverse reaction to anesthesia    unknown---pt. is adopted  . History of Clostridium difficile colitis   . Infectious mononucleosis 01/25/2014   no obvious complication except she complains of severe pain in her throat not responsive to ibuprofen discussed  options pain medicines steroid risk benefit   . Irregular menses 11/08/2010  . Knee injury 04/19/2011   Poss traumatic bursitis tendinitis knee looks stable  .   Marland Kitchen Myopia   . Pes planus    consult [facility]  . Tonsillar hypertrophy     Past Surgical History:  Procedure Laterality Date  .  APPENDECTOMY    . LAPAROSCOPIC APPENDECTOMY N/A 10/28/2014   Procedure: APPENDECTOMY LAPAROSCOPIC;  Surgeon: Excell Seltzer, MD;  Location: [facility];  Service: General;  Laterality: N/A;  . MYRINGOTOMY    . TONSILLECTOMY Bilateral 03/19/2017   Procedure: TONSILLECTOMY;  Surgeon: Melissa Montane, MD;  Location: [place];  Service: ENT;  Laterality: Bilateral;    Family History  Adopted: Yes  Problem Relation Age of Onset  . ADD / ADHD Brother        possible ld    Social History   Tobacco Use  . Smoking status: Never Smoker  . Smokeless tobacco: Never Used  Vaping Use  . Vaping Use: Never used  Substance Use Topics  . Alcohol use: Yes    Alcohol/week: 0.0 standard drinks    Comment: ocassionally   . Drug use: No      Current Outpatient Medications:  .  buPROPion (WELLBUTRIN XL) 150 MG 24 hr tablet, Take 150 mg by mouth every morning., Disp: , Rfl:  .  drospirenone-ethinyl estradiol (YAZ,GIANVI,LORYNA) 3-0.02 MG tablet, Take 1 tablet by mouth daily., Disp: , Rfl:  .  lisdexamfetamine (VYVANSE) 40 MG capsule, Take 1 capsule (40 mg total) by mouth every morning., Disp: 90 capsule, Rfl: 0 .  omeprazole (PRILOSEC) 40 MG capsule, Take 1 capsule (40 mg total) by mouth daily., Disp: 90 capsule, Rfl: 1 .  Oxcarbazepine (TRILEPTAL) 300 MG tablet, TAKE 1 TABLET BY MOUTH  AT BEDTIME FOR 2 DAYS, 1 TABLET BY MOUTH TWO TIMES DAILY FOR 4 DAYS THEN 2 TABLETS BY MOUTH TWO TIMES DAILY, Disp: , Rfl:  .  mupirocin ointment (BACTROBAN) 2 %, Apply 1 application topically 2 (two) times daily., Disp: 30 g, Rfl: 2  EXAM: BP Readings from Last 3 Encounters:  03/15/19 104/73  09/10/18 124/84  06/12/18 91/64    VITALS per patient if applicable:  GENERAL: alert, oriented, appears well and in no acute distress  HEENT: atraumatic, conjunttiva clear, no obvious abnormalities on inspection of external nose and ears  NECK: normal movements of the head and neck  LUNGS: on inspection no  signs of respiratory distress, breathing rate appears normal, no obvious gross SOB, gasping or wheezing  CV: no obvious cyanosis  MS: moves all visible extremities without noticeable abnormality  PSYCH/NEURO: pleasant and cooperative, no obvious depression or anxiety, speech and thought processing grossly intact   ASSESSMENT AND PLAN:  Discussed the following assessment and plan:    ICD-10-CM   1. Medication management  Z79.899   2. Heartburn  R12    nocturnal   3. Attention deficit disorder, unspecified hyperactivity presence  F98.8    vyvanse  benefot more than risk at this time     Counseled.  Long term poss risk of ppi use  She has past hx of c diff and can try taking  Med before eating  Etc at this time continue  Same dosing  Seems to work well  Due for td /tdap booster this  Year can make appt for such etc  Of wellness if needed   Expectant management and discussion of plan and treatment with opportunity to ask questions and all were answered. The patient agreed with the plan and demonstrated an understanding of the instructions.   Advised to call back or seek an in-person evaluation if worsening  or having  further concerns . Return if symptoms worsen or fail to improve, for when due  get td tdap med check 6 mos.    Shanon Ace, MD

## 2019-09-08 MED FILL — GABAPENTIN 300 MG CAPSULE: 300 | 30 days supply | Qty: 60 | Fill #0

## 2019-09-13 ENCOUNTER — Other Ambulatory Visit (HOSPITAL_COMMUNITY): Payer: Self-pay | Admitting: Obstetrics

## 2019-09-13 DIAGNOSIS — Z113 Encounter for screening for infections with a predominantly sexual mode of transmission: Secondary | ICD-10-CM | POA: Diagnosis not present

## 2019-09-13 DIAGNOSIS — Z124 Encounter for screening for malignant neoplasm of cervix: Secondary | ICD-10-CM | POA: Diagnosis not present

## 2019-09-13 DIAGNOSIS — Z01419 Encounter for gynecological examination (general) (routine) without abnormal findings: Secondary | ICD-10-CM | POA: Diagnosis not present

## 2019-09-13 LAB — HM PAP SMEAR

## 2019-09-16 DIAGNOSIS — F4323 Adjustment disorder with mixed anxiety and depressed mood: Secondary | ICD-10-CM | POA: Diagnosis not present

## 2019-09-16 DIAGNOSIS — F4322 Adjustment disorder with anxiety: Secondary | ICD-10-CM | POA: Diagnosis not present

## 2019-09-20 MED FILL — JASMIEL 3-0.02 MG TABS: 3-0.02 | 84 days supply | Qty: 112 | Fill #0

## 2019-09-22 MED FILL — AZITHROMYCIN 500 MG TABS: 500 | 1 days supply | Qty: 2 | Fill #0

## 2019-09-28 NOTE — Telephone Encounter (Signed)
Lisa Hines  Talked with your dad  Call Dr Donneta Romberg  First and if need referral will do this   But not sure needed

## 2019-10-04 ENCOUNTER — Other Ambulatory Visit (HOSPITAL_COMMUNITY): Payer: Self-pay | Admitting: Allergy

## 2019-10-04 DIAGNOSIS — L501 Idiopathic urticaria: Secondary | ICD-10-CM | POA: Diagnosis not present

## 2019-10-04 DIAGNOSIS — J301 Allergic rhinitis due to pollen: Secondary | ICD-10-CM | POA: Diagnosis not present

## 2019-10-04 DIAGNOSIS — J3081 Allergic rhinitis due to animal (cat) (dog) hair and dander: Secondary | ICD-10-CM | POA: Diagnosis not present

## 2019-10-04 DIAGNOSIS — T50905D Adverse effect of unspecified drugs, medicaments and biological substances, subsequent encounter: Secondary | ICD-10-CM | POA: Diagnosis not present

## 2019-10-04 MED FILL — LAMOTRIGINE 25 MG TABS: 25 | 30 days supply | Qty: 90 | Fill #0

## 2019-10-04 MED FILL — buPROPion HCL ER (XL) 150 M: 150 | 30 days supply | Qty: 30 | Fill #4

## 2019-10-04 MED FILL — MOMETASONE FUROATE 0.1% CRE: 0.1 | 30 days supply | Qty: 90 | Fill #0

## 2019-10-04 MED FILL — LEVOCETIRIZINE 5 MG TABLET: 5 | 30 days supply | Qty: 30 | Fill #0

## 2019-10-04 MED FILL — AZELASTINE HCL 137 MCG/SPRA: 137 | 25 days supply | Qty: 30 | Fill #0

## 2019-10-07 DIAGNOSIS — F4323 Adjustment disorder with mixed anxiety and depressed mood: Secondary | ICD-10-CM | POA: Diagnosis not present

## 2019-10-07 DIAGNOSIS — F4322 Adjustment disorder with anxiety: Secondary | ICD-10-CM | POA: Diagnosis not present

## 2019-10-19 DIAGNOSIS — F4323 Adjustment disorder with mixed anxiety and depressed mood: Secondary | ICD-10-CM | POA: Diagnosis not present

## 2019-10-19 DIAGNOSIS — F4322 Adjustment disorder with anxiety: Secondary | ICD-10-CM | POA: Diagnosis not present

## 2019-10-28 MED FILL — LEVOCETIRIZINE 5 MG TABLET: 5 | 30 days supply | Qty: 30 | Fill #1

## 2019-10-28 MED FILL — buPROPion HCL ER (XL) 150 M: 150 | 30 days supply | Qty: 30 | Fill #5

## 2019-10-28 MED FILL — LAMOTRIGINE 25 MG TABS: 25 | 30 days supply | Qty: 90 | Fill #1

## 2019-11-08 DIAGNOSIS — F4322 Adjustment disorder with anxiety: Secondary | ICD-10-CM | POA: Diagnosis not present

## 2019-11-08 DIAGNOSIS — F4323 Adjustment disorder with mixed anxiety and depressed mood: Secondary | ICD-10-CM | POA: Diagnosis not present

## 2019-11-26 DIAGNOSIS — Z03818 Encounter for observation for suspected exposure to other biological agents ruled out: Secondary | ICD-10-CM | POA: Diagnosis not present

## 2019-11-26 DIAGNOSIS — Z20822 Contact with and (suspected) exposure to covid-19: Secondary | ICD-10-CM | POA: Diagnosis not present

## 2019-12-02 ENCOUNTER — Other Ambulatory Visit (HOSPITAL_COMMUNITY): Payer: Self-pay | Admitting: Psychiatry

## 2019-12-02 MED FILL — buPROPion HCL ER (XL) 150 M: 150 | 30 days supply | Qty: 30 | Fill #0

## 2019-12-13 MED FILL — LEVOCETIRIZINE 5 MG TABLET: 5 | 30 days supply | Qty: 30 | Fill #2

## 2019-12-13 MED FILL — OMEPRAZOLE 40 MG CPDR: 40 | 90 days supply | Qty: 90 | Fill #1

## 2019-12-13 MED FILL — LAMOTRIGINE 25 MG TABS: 25 | 30 days supply | Qty: 90 | Fill #2

## 2019-12-15 DIAGNOSIS — Z20822 Contact with and (suspected) exposure to covid-19: Secondary | ICD-10-CM | POA: Diagnosis not present

## 2019-12-15 DIAGNOSIS — Z03818 Encounter for observation for suspected exposure to other biological agents ruled out: Secondary | ICD-10-CM | POA: Diagnosis not present

## 2019-12-15 DIAGNOSIS — Z20828 Contact with and (suspected) exposure to other viral communicable diseases: Secondary | ICD-10-CM | POA: Diagnosis not present

## 2019-12-15 DIAGNOSIS — J069 Acute upper respiratory infection, unspecified: Secondary | ICD-10-CM | POA: Diagnosis not present

## 2019-12-15 DIAGNOSIS — J029 Acute pharyngitis, unspecified: Secondary | ICD-10-CM | POA: Diagnosis not present

## 2019-12-15 DIAGNOSIS — R110 Nausea: Secondary | ICD-10-CM | POA: Diagnosis not present

## 2019-12-16 DIAGNOSIS — F4322 Adjustment disorder with anxiety: Secondary | ICD-10-CM | POA: Diagnosis not present

## 2019-12-16 DIAGNOSIS — F4323 Adjustment disorder with mixed anxiety and depressed mood: Secondary | ICD-10-CM | POA: Diagnosis not present

## 2019-12-27 DIAGNOSIS — F4323 Adjustment disorder with mixed anxiety and depressed mood: Secondary | ICD-10-CM | POA: Diagnosis not present

## 2019-12-27 DIAGNOSIS — F4322 Adjustment disorder with anxiety: Secondary | ICD-10-CM | POA: Diagnosis not present

## 2019-12-27 DIAGNOSIS — Z20822 Contact with and (suspected) exposure to covid-19: Secondary | ICD-10-CM | POA: Diagnosis not present

## 2019-12-27 DIAGNOSIS — Z03818 Encounter for observation for suspected exposure to other biological agents ruled out: Secondary | ICD-10-CM | POA: Diagnosis not present

## 2019-12-30 DIAGNOSIS — R051 Acute cough: Secondary | ICD-10-CM | POA: Diagnosis not present

## 2019-12-30 DIAGNOSIS — J029 Acute pharyngitis, unspecified: Secondary | ICD-10-CM | POA: Diagnosis not present

## 2020-01-05 DIAGNOSIS — F4323 Adjustment disorder with mixed anxiety and depressed mood: Secondary | ICD-10-CM | POA: Diagnosis not present

## 2020-01-05 DIAGNOSIS — F4322 Adjustment disorder with anxiety: Secondary | ICD-10-CM | POA: Diagnosis not present

## 2020-01-13 MED FILL — LAMOTRIGINE 25 MG TABS: 25 | 30 days supply | Qty: 90 | Fill #3

## 2020-01-13 MED FILL — buPROPion HCL ER (XL) 150 M: 150 | 30 days supply | Qty: 30 | Fill #1

## 2020-01-13 MED FILL — LEVOCETIRIZINE 5 MG TABLET: 5 | 30 days supply | Qty: 30 | Fill #3

## 2020-01-13 MED FILL — JASMIEL 3-0.02 MG TABS: 3-0.02 | 84 days supply | Qty: 112 | Fill #1

## 2020-01-18 ENCOUNTER — Encounter: Payer: Self-pay | Admitting: Adult Health

## 2020-01-25 ENCOUNTER — Encounter: Payer: Self-pay | Admitting: Adult Health

## 2020-01-25 DIAGNOSIS — Z03818 Encounter for observation for suspected exposure to other biological agents ruled out: Secondary | ICD-10-CM | POA: Diagnosis not present

## 2020-01-25 DIAGNOSIS — Z20822 Contact with and (suspected) exposure to covid-19: Secondary | ICD-10-CM | POA: Diagnosis not present

## 2020-02-04 DIAGNOSIS — F4322 Adjustment disorder with anxiety: Secondary | ICD-10-CM | POA: Diagnosis not present

## 2020-02-04 DIAGNOSIS — F4323 Adjustment disorder with mixed anxiety and depressed mood: Secondary | ICD-10-CM | POA: Diagnosis not present

## 2020-02-08 DIAGNOSIS — H5213 Myopia, bilateral: Secondary | ICD-10-CM | POA: Diagnosis not present

## 2020-02-23 ENCOUNTER — Other Ambulatory Visit: Payer: Self-pay | Admitting: Internal Medicine

## 2020-02-23 ENCOUNTER — Other Ambulatory Visit (HOSPITAL_COMMUNITY): Payer: Self-pay | Admitting: Allergy

## 2020-02-23 MED FILL — LEVOCETIRIZINE 5 MG TABLET: 5 | 30 days supply | Qty: 30 | Fill #0

## 2020-02-23 MED FILL — buPROPion HCL ER (XL) 150 M: 150 | 30 days supply | Qty: 30 | Fill #2

## 2020-02-24 ENCOUNTER — Other Ambulatory Visit: Payer: Self-pay | Admitting: Internal Medicine

## 2020-02-24 MED FILL — LAMOTRIGINE 25 MG TABS: 25 | 30 days supply | Qty: 90 | Fill #0

## 2020-02-24 MED FILL — OMEPRAZOLE 40 MG CPDR: 40 | 90 days supply | Qty: 90 | Fill #0

## 2020-02-24 NOTE — Telephone Encounter (Signed)
Last OV 09/01/19 Last refill 09/01/19 #90/1 Next OV not scheduled

## 2020-03-13 MED FILL — LEVOCETIRIZINE 5 MG TABLET: 5 | 30 days supply | Qty: 30 | Fill #0

## 2020-03-13 MED FILL — OMEPRAZOLE 40 MG CPDR: 40 | 90 days supply | Qty: 90 | Fill #0

## 2020-03-13 MED FILL — buPROPion HCL ER (XL) 150 M: 150 | 30 days supply | Qty: 30 | Fill #2

## 2020-03-13 MED FILL — LAMOTRIGINE 25 MG TABS: 25 | 30 days supply | Qty: 90 | Fill #0

## 2020-03-14 NOTE — Telephone Encounter (Signed)
So Make a visit with me  Virtual is ok    And then we can place correct orders  For blood work

## 2020-03-15 ENCOUNTER — Encounter: Payer: Self-pay | Admitting: Internal Medicine

## 2020-03-15 ENCOUNTER — Telehealth (INDEPENDENT_AMBULATORY_CARE_PROVIDER_SITE_OTHER): Payer: 59 | Admitting: Internal Medicine

## 2020-03-15 VITALS — Wt 185.0 lb

## 2020-03-15 DIAGNOSIS — Z79899 Other long term (current) drug therapy: Secondary | ICD-10-CM | POA: Diagnosis not present

## 2020-03-15 DIAGNOSIS — E611 Iron deficiency: Secondary | ICD-10-CM | POA: Diagnosis not present

## 2020-03-15 DIAGNOSIS — R5383 Other fatigue: Secondary | ICD-10-CM | POA: Diagnosis not present

## 2020-03-15 NOTE — Progress Notes (Signed)
Virtual Visit via Video Note  I connected with@ on 03/16/20 at 12:30 PM EST by a video enabled telemedicine application and verified that I am speaking with the correct person using two identifiers. Connected  Location patient: home Location provider:work  office Persons participating in the virtual visit: patient, provider  [place] national recommendations  regarding COVID 19 pandemic   video visit is advised over in office visit for this patient.  Patient aware  of the limitations of evaluation and management by telemedicine and  availability of in person appointments. and agreed to proceed.   HPI: Lisa Hines presents for video visit concerning more recent increased fatigue and sleepiness and sleep needs. She has had a history of iron deficiency and family history of thyroid in the past requesting updated blood work. Most recently she is graduated from college and will be starting her job full-time next week. Recently she was sleeping 10+ hours. Medications have not really changed include Wellbutrin Vyvanse Prilosec Trileptal and yes. Periods are okay no risk of pregnancy No respiratory GI mood changes. I son ppi    ROS: See pertinent positives and negatives per HPI.  Past Medical History:  Diagnosis Date  . Acute appendicitis with localized peritonitis 10/28/2014  . ADD (attention deficit disorder)   . Family history of adverse reaction to anesthesia    unknown---pt. is adopted  . History of Clostridium difficile colitis   . Infectious mononucleosis 01/25/2014   no obvious complication except she complains of severe pain in her throat not responsive to ibuprofen discussed  options pain medicines steroid risk benefit   . Irregular menses 11/08/2010  . Knee injury 04/19/2011   Poss traumatic bursitis tendinitis knee looks stable  .   Marland Kitchen Myopia   . Pes planus    consult [facility]  . Tonsillar hypertrophy     Past Surgical History:  Procedure Laterality Date  .  APPENDECTOMY    . LAPAROSCOPIC APPENDECTOMY N/A 10/28/2014   Procedure: APPENDECTOMY LAPAROSCOPIC;  Surgeon: Glenna Fellows, MD;  Location: [facility];  Service: General;  Laterality: N/A;  . MYRINGOTOMY    . TONSILLECTOMY Bilateral 03/19/2017   Procedure: TONSILLECTOMY;  Surgeon: Suzanna Obey, MD;  Location: [facility];  Service: ENT;  Laterality: Bilateral;    Family History  Adopted: Yes  Problem Relation Age of Onset  . ADD / ADHD Brother        possible ld    Social History   Tobacco Use  . Smoking status: Never Smoker  . Smokeless tobacco: Never Used  Vaping Use  . Vaping Use: Every day  Substance Use Topics  . Alcohol use: Yes    Alcohol/week: 0.0 standard drinks    Comment: ocassionally   . Drug use: No      Current Outpatient Medications:  .  buPROPion (WELLBUTRIN XL) 150 MG 24 hr tablet, Take 150 mg by mouth every morning., Disp: , Rfl:  .  drospirenone-ethinyl estradiol (YAZ,GIANVI,LORYNA) 3-0.02 MG tablet, Take 1 tablet by mouth daily., Disp: , Rfl:  .  levocetirizine (XYZAL) 5 MG tablet, SMARTSIG:1 Tablet(s) By Mouth Every Evening, Disp: , Rfl:  .  lisdexamfetamine (VYVANSE) 40 MG capsule, Take 1 capsule (40 mg total) by mouth every morning., Disp: 90 capsule, Rfl: 0 .  omeprazole (PRILOSEC) 40 MG capsule, TAKE 1 CAPSULE BY MOUTH DAILY, Disp: 90 capsule, Rfl: 1 .  Oxcarbazepine (TRILEPTAL) 300 MG tablet, TAKE 1 TABLET BY MOUTH AT BEDTIME FOR 2 DAYS, 1 TABLET BY MOUTH TWO  TIMES DAILY FOR 4 DAYS THEN 2 TABLETS BY MOUTH TWO TIMES DAILY, Disp: , Rfl:   EXAM: BP Readings from Last 3 Encounters:  03/15/19 104/73  09/10/18 124/84  06/12/18 91/64    VITALS per patient if applicable:  GENERAL: alert, oriented,  and in no acute distress  LUNGS: no signs of respiratory distress, breathing rate appears normal, no obvious gross SOB, gasping or wheezing  PSYCH/NEURO: pleasant and cooperative, no obvious depression or anxiety, speech and thought processing  grossly intact Lab Results  Component Value Date   WBC 5.7 05/19/2019   HGB 14.0 05/19/2019   HCT 40.8 05/19/2019   PLT 338.0 05/19/2019   GLUCOSE 105 (H) 05/19/2019   CHOL 169 05/19/2019   TRIG 215.0 (H) 05/19/2019   HDL 38.50 (L) 05/19/2019   LDLDIRECT 108.0 05/19/2019   ALT 17 03/24/2018   AST 20 03/24/2018   NA 137 05/19/2019   K 4.6 05/19/2019   CL 106 05/19/2019   CREATININE 0.65 05/19/2019   BUN 10 05/19/2019   CO2 23 05/19/2019   TSH 1.52 05/19/2019   HGBA1C 5.3 03/24/2018    ASSESSMENT AND PLAN:  Discussed the following assessment and plan:    ICD-10-CM   1. Medication management  123456 BASIC METABOLIC PANEL WITH GFR    CBC with Differential/Platelet    Lipid panel    Hepatic function panel    TSH    Hemoglobin A1c    T4, free    Vitamin B12    Iron, TIBC and Ferritin Panel    VITAMIN D 25 Hydroxy (Vit-D Deficiency, Fractures)  2. Other fatigue  123456 BASIC METABOLIC PANEL WITH GFR    CBC with Differential/Platelet    Lipid panel    Hepatic function panel    TSH    Hemoglobin A1c    T4, free    Vitamin B12    Iron, TIBC and Ferritin Panel    VITAMIN D 25 Hydroxy (Vit-D Deficiency, Fractures)  3. Low energy  123456 BASIC METABOLIC PANEL WITH GFR    CBC with Differential/Platelet    Lipid panel    Hepatic function panel    TSH    Hemoglobin A1c    T4, free    Vitamin B12    Iron, TIBC and Ferritin Panel    VITAMIN D 25 Hydroxy (Vit-D Deficiency, Fractures)  4. Iron deficiency  [id number] BASIC METABOLIC PANEL WITH GFR    CBC with Differential/Platelet    Lipid panel    Hepatic function panel    TSH    Hemoglobin A1c    T4, free    Vitamin B12    Iron, TIBC and Ferritin Panel    VITAMIN D 25 Hydroxy (Vit-D Deficiency, Fractures)   This fatigue could be a combination of recent activity life changes and catch-up but because of medications and past history agree with updated lab work prefer fasting if possible.  Orders will be placed.  b12 d   Because with chronic use of PPI  Congratulations on graduation from college. Counseled.   Expectant management and discussion of plan and treatment with opportunity to ask questions and all were answered. The patient agreed with the plan and demonstrated an understanding of the instructions.   Advised to call back or seek an in-person evaluation if worsening  or having  further concerns . Return for depending on results and how doing   in person if needed .  Shanon Ace, MD

## 2020-03-16 ENCOUNTER — Encounter: Payer: Self-pay | Admitting: Internal Medicine

## 2020-03-22 DIAGNOSIS — Z113 Encounter for screening for infections with a predominantly sexual mode of transmission: Secondary | ICD-10-CM | POA: Diagnosis not present

## 2020-03-22 DIAGNOSIS — Z3202 Encounter for pregnancy test, result negative: Secondary | ICD-10-CM | POA: Diagnosis not present

## 2020-03-22 DIAGNOSIS — L292 Pruritus vulvae: Secondary | ICD-10-CM | POA: Diagnosis not present

## 2020-03-22 DIAGNOSIS — N760 Acute vaginitis: Secondary | ICD-10-CM | POA: Diagnosis not present

## 2020-03-24 DIAGNOSIS — F902 Attention-deficit hyperactivity disorder, combined type: Secondary | ICD-10-CM | POA: Diagnosis not present

## 2020-04-04 ENCOUNTER — Other Ambulatory Visit (HOSPITAL_COMMUNITY): Payer: Self-pay | Admitting: General Surgery

## 2020-04-04 DIAGNOSIS — L739 Follicular disorder, unspecified: Secondary | ICD-10-CM | POA: Diagnosis not present

## 2020-04-04 DIAGNOSIS — L578 Other skin changes due to chronic exposure to nonionizing radiation: Secondary | ICD-10-CM | POA: Diagnosis not present

## 2020-04-04 DIAGNOSIS — D225 Melanocytic nevi of trunk: Secondary | ICD-10-CM | POA: Diagnosis not present

## 2020-04-04 DIAGNOSIS — D2271 Melanocytic nevi of right lower limb, including hip: Secondary | ICD-10-CM | POA: Diagnosis not present

## 2020-04-04 DIAGNOSIS — L814 Other melanin hyperpigmentation: Secondary | ICD-10-CM | POA: Diagnosis not present

## 2020-04-04 MED FILL — ERYTHROMYCIN 2% GEL: 2 | 30 days supply | Qty: 30 | Fill #0

## 2020-04-12 MED FILL — buPROPion HCL ER (XL) 150 M: 150 | 30 days supply | Qty: 30 | Fill #3

## 2020-04-12 MED FILL — LEVOCETIRIZINE 5 MG TABLET: 5 | 30 days supply | Qty: 30 | Fill #1

## 2020-04-12 MED FILL — LAMOTRIGINE 25 MG TABS: 25 | 30 days supply | Qty: 90 | Fill #1

## 2020-04-21 DIAGNOSIS — F902 Attention-deficit hyperactivity disorder, combined type: Secondary | ICD-10-CM | POA: Diagnosis not present

## 2020-04-26 DIAGNOSIS — H93292 Other abnormal auditory perceptions, left ear: Secondary | ICD-10-CM | POA: Diagnosis not present

## 2020-04-26 DIAGNOSIS — H6982 Other specified disorders of Eustachian tube, left ear: Secondary | ICD-10-CM | POA: Diagnosis not present

## 2020-04-28 DIAGNOSIS — F902 Attention-deficit hyperactivity disorder, combined type: Secondary | ICD-10-CM | POA: Diagnosis not present

## 2020-05-01 ENCOUNTER — Other Ambulatory Visit: Payer: Self-pay

## 2020-05-02 ENCOUNTER — Other Ambulatory Visit: Payer: Self-pay | Admitting: Internal Medicine

## 2020-05-02 MED ORDER — LISDEXAMFETAMINE DIMESYLATE 40 MG PO CAPS: 40.0000 mg | ORAL_CAPSULE | ORAL | 0 refills | Status: DC

## 2020-05-02 MED FILL — VYVANSE 40 MG CAPSULE: 40 | 90 days supply | Qty: 90 | Fill #0

## 2020-05-02 NOTE — Telephone Encounter (Signed)
Last video visit- 03/15/2020 Last refill- 08/09/2019-90 tabs no refills  Next office visit- no scheduled

## 2020-05-15 MED FILL — JASMIEL 3-0.02 MG TABS: 3-0.02 | 84 days supply | Qty: 112 | Fill #2

## 2020-05-15 MED FILL — buPROPion HCL ER (XL) 150 M: 150 | 30 days supply | Qty: 30 | Fill #4

## 2020-05-15 MED FILL — LAMOTRIGINE 25 MG TABS: 25 | 30 days supply | Qty: 90 | Fill #2

## 2020-05-15 MED FILL — LEVOCETIRIZINE 5 MG TABLET: 5 | 30 days supply | Qty: 30 | Fill #2

## 2020-05-23 ENCOUNTER — Other Ambulatory Visit (HOSPITAL_COMMUNITY): Payer: Self-pay | Admitting: Allergy

## 2020-05-23 MED FILL — EPINEPHRINE 0.3 MG AUTO-INJ: 0.3 | 30 days supply | Qty: 2 | Fill #0

## 2020-05-24 DIAGNOSIS — F902 Attention-deficit hyperactivity disorder, combined type: Secondary | ICD-10-CM | POA: Diagnosis not present

## 2020-06-01 ENCOUNTER — Telehealth: Payer: Self-pay | Admitting: Internal Medicine

## 2020-06-01 ENCOUNTER — Other Ambulatory Visit: Payer: Self-pay

## 2020-06-01 NOTE — Telephone Encounter (Addendum)
Left patient a voicemail of approved TOC request with Tommi Rumps and to schedule appointment.

## 2020-06-02 ENCOUNTER — Encounter: Payer: Self-pay | Admitting: Adult Health

## 2020-06-02 ENCOUNTER — Ambulatory Visit: Payer: 59 | Admitting: Adult Health

## 2020-06-02 VITALS — BP 116/76 | HR 97 | Temp 98.4°F | Ht 67.0 in | Wt 193.8 lb

## 2020-06-02 DIAGNOSIS — F339 Major depressive disorder, recurrent, unspecified: Secondary | ICD-10-CM | POA: Diagnosis not present

## 2020-06-02 DIAGNOSIS — F988 Other specified behavioral and emotional disorders with onset usually occurring in childhood and adolescence: Secondary | ICD-10-CM | POA: Diagnosis not present

## 2020-06-02 DIAGNOSIS — K219 Gastro-esophageal reflux disease without esophagitis: Secondary | ICD-10-CM | POA: Diagnosis not present

## 2020-06-02 DIAGNOSIS — R1084 Generalized abdominal pain: Secondary | ICD-10-CM | POA: Diagnosis not present

## 2020-06-02 DIAGNOSIS — Z0000 Encounter for general adult medical examination without abnormal findings: Secondary | ICD-10-CM

## 2020-06-02 NOTE — Progress Notes (Signed)
Patient presents to clinic today to establish care. She is a pleasant 25 year old female who  has a past medical history of Acute appendicitis with localized peritonitis (10/28/2014), ADD (attention deficit disorder), Family history of adverse reaction to anesthesia, History of Clostridium difficile colitis, Infectious mononucleosis (01/25/2014), Irregular menses (11/08/2010), Knee injury (04/19/2011), Myopia, Pes planus, and Tonsillar hypertrophy.      Acute Concerns: Establish Care  Chronic Issues: ADHD -controlled with Vyvanse 40 mg. Prescribed by PCP  GERD-is been taking omeprazole for a while.  She takes this before bed with her other meds to prevent heartburn at night.  She does report that this helps  Abdominal Pain -she reports that since late last year she has had multiple episodes of generalized abdominal pain that seems to be worse across the upper abdomen.  This happened 3 times since last fall.  Associated symptoms include diarrhea, vomiting, and intense abdominal pain.  Has lasted up to 2 hours.  She also reports over the last week she has had cramping abdominal pain every morning around 10 AM this quickly resolves.  Seasonal Allergies - Takes   Adjustment Disorder with mixed anxiety and depressed mood - seen by [facility]. Currently prescribed Wellbutrin 150 mg daily as well as Lamictal - feels controlled.   Health Maintenance: Dental -- Routine Care Vision -- Routine Care Immunizations -- UTD  PAP -- Seen by GYN  Past Medical History:  Diagnosis Date  . Acute appendicitis with localized peritonitis 10/28/2014  . ADD (attention deficit disorder)   . Family history of adverse reaction to anesthesia    unknown---pt. is adopted  . History of Clostridium difficile colitis   . Infectious mononucleosis 01/25/2014   no obvious complication except she complains of severe pain in her throat not responsive to ibuprofen discussed  options pain medicines steroid risk benefit   .  Irregular menses 11/08/2010  . Knee injury 04/19/2011   Poss traumatic bursitis tendinitis knee looks stable  .   Marland Kitchen Myopia   . Pes planus    consult [facility]  . Tonsillar hypertrophy     Past Surgical History:  Procedure Laterality Date  . APPENDECTOMY    . LAPAROSCOPIC APPENDECTOMY N/A 10/28/2014   Procedure: APPENDECTOMY LAPAROSCOPIC;  Surgeon: Excell Seltzer, MD;  Location: [facility];  Service: General;  Laterality: N/A;  . MYRINGOTOMY    . TONSILLECTOMY Bilateral 03/19/2017   Procedure: TONSILLECTOMY;  Surgeon: Melissa Montane, MD;  Location: [facility];  Service: ENT;  Laterality: Bilateral;    Current Outpatient Medications on File Prior to Visit  Medication Sig Dispense Refill  . buPROPion (WELLBUTRIN XL) 150 MG 24 hr tablet Take 150 mg by mouth every morning.    . drospirenone-ethinyl estradiol (YAZ,GIANVI,LORYNA) 3-0.02 MG tablet Take 1 tablet by mouth daily.    Marland Kitchen levocetirizine (XYZAL) 5 MG tablet SMARTSIG:1 Tablet(s) By Mouth Every Evening    . lisdexamfetamine (VYVANSE) 40 MG capsule Take 1 capsule (40 mg total) by mouth every morning. 90 capsule 0  . omeprazole (PRILOSEC) 40 MG capsule TAKE 1 CAPSULE BY MOUTH DAILY 90 capsule 1   No current facility-administered medications on file prior to visit.    Allergies  Allergen Reactions  . Lamictal [Lamotrigine] Hives  . Clindamycin Other (See Comments)    cdiff colitis  . Latex     Other reaction(s): Other, Other (See Comments)  . Amoxicillin-Pot Clavulanate Rash  . Penicillin G Rash    Family History  Adopted: Yes  Problem Relation Age of Onset  . ADD / ADHD Brother        possible ld    Social History   Socioeconomic History  . Marital status: Single    Spouse name: Not on file  . Number of children: Not on file  . Years of education: Not on file  . Highest education level: Not on file  Occupational History  . Not on file  Tobacco Use  . Smoking status: Never Smoker  . Smokeless tobacco: Never  Used  Vaping Use  . Vaping Use: Every day  Substance and Sexual Activity  . Alcohol use: Yes    Alcohol/week: 0.0 standard drinks    Comment: ocassionally   . Drug use: No  . Sexual activity: Not on file  Other Topics Concern  . Not on file  Social History Narrative   Caretaker verifies today that the child's current immunizations are up to date.   Child is adopted   Sib adopted   Active at school gymnastics, swimming, tennis and horseback riding seasonal now volleyball   Sleep about 9 hours or so   AL program   [employer]   ib courses   Field hockey diving and lacrosse in past    Intact family      Social Determinants of Radio broadcast assistant Strain: Not on file  Food Insecurity: Not on file  Transportation Needs: Not on file  Physical Activity: Not on file  Stress: Not on file  Social Connections: Not on file  Intimate Partner Violence: Not on file    Review of Systems  Constitutional: Negative.   HENT: Negative.   Eyes: Negative.   Respiratory: Negative.   Cardiovascular: Negative.   Gastrointestinal: Positive for abdominal pain, diarrhea and vomiting.  Genitourinary: Negative.   Musculoskeletal: Negative.   Skin: Negative.   Neurological: Negative.   Endo/Heme/Allergies: Negative.   Psychiatric/Behavioral: Negative.   All other systems reviewed and are negative.   BP 116/76 (BP Location: Left Arm, Patient Position: Sitting, Cuff Size: Normal)   Pulse 97   Temp 98.4 F (36.9 C) (Oral)   Ht 5\' 7"  (1.702 m)   Wt 193 lb 12.8 oz (87.9 kg)   LMP 05/19/2020 (Exact Date)   SpO2 92%   BMI 30.35 kg/m   Physical Exam Vitals and nursing note reviewed.  Constitutional:      General: She is not in acute distress.    Appearance: Normal appearance. She is well-developed. She is not ill-appearing.  HENT:     Head: Normocephalic and atraumatic.     Right Ear: Tympanic membrane, ear canal and external ear normal. There is no impacted cerumen.     Left Ear:  Tympanic membrane, ear canal and external ear normal. There is no impacted cerumen.     Nose: Nose normal. No congestion or rhinorrhea.     Mouth/Throat:     Mouth: Mucous membranes are moist.     Pharynx: Oropharynx is clear. No oropharyngeal exudate or posterior oropharyngeal erythema.  Eyes:     General:        Right eye: No discharge.        Left eye: No discharge.     Extraocular Movements: Extraocular movements intact.     Conjunctiva/sclera: Conjunctivae normal.     Pupils: Pupils are equal, round, and reactive to light.  Neck:     Thyroid: No thyromegaly.     Vascular: No carotid bruit.     Trachea:  No tracheal deviation.  Cardiovascular:     Rate and Rhythm: Normal rate and regular rhythm.     Pulses: Normal pulses.     Heart sounds: Normal heart sounds. No murmur heard. No friction rub. No gallop.   Pulmonary:     Effort: Pulmonary effort is normal. No respiratory distress.     Breath sounds: Normal breath sounds. No stridor. No wheezing, rhonchi or rales.  Chest:     Chest wall: No tenderness.  Abdominal:     General: Abdomen is flat. Bowel sounds are normal. There is no distension.     Palpations: Abdomen is soft. There is no mass.     Tenderness: There is no abdominal tenderness. There is no right CVA tenderness, left CVA tenderness, guarding or rebound.     Hernia: No hernia is present.  Musculoskeletal:        General: No swelling, tenderness, deformity or signs of injury. Normal range of motion.     Cervical back: Normal range of motion and neck supple.     Right lower leg: No edema.     Left lower leg: No edema.  Lymphadenopathy:     Cervical: No cervical adenopathy.  Skin:    General: Skin is warm and dry.     Coloration: Skin is not jaundiced or pale.     Findings: No bruising, erythema, lesion or rash.  Neurological:     General: No focal deficit present.     Mental Status: She is alert and oriented to person, place, and time.     Cranial Nerves: No  cranial nerve deficit.     Sensory: No sensory deficit.     Motor: No weakness.     Coordination: Coordination normal.     Gait: Gait normal.     Deep Tendon Reflexes: Reflexes normal.  Psychiatric:        Mood and Affect: Mood normal.        Behavior: Behavior normal.        Thought Content: Thought content normal.        Judgment: Judgment normal.    Assessment/Plan: 1. Encounter for medical examination to establish care - Follow up for CPE or sooner if needed  2. Attention deficit disorder, unspecified hyperactivity presence - Will continue on Vyvance   3. Gastroesophageal reflux disease without esophagitis - continue prilosec   4. Depression, recurrent ([place]) - Follow up with psychiatry as directed  5. Generalized abdominal pain - R/o gallbladder disease - US Abdomen Limited RUQ (LIVER/GB); Future  Dorothyann Peng, NP

## 2020-06-05 ENCOUNTER — Telehealth: Payer: Self-pay | Admitting: Adult Health

## 2020-06-05 MED FILL — EPINEPHRINE 0.3 MG AUTO-INJ: 0.3 | 30 days supply | Qty: 2 | Fill #0

## 2020-06-05 NOTE — Telephone Encounter (Signed)
Lisa Hines is calling in to see if she can get the order changed due to them not being able to do R upper Quad and stated that the order was for pain worsen in upper R side.  They would like to see if it is okay to do [place] of abdomen complete and it would cover R & L Quad.

## 2020-06-06 ENCOUNTER — Other Ambulatory Visit: Payer: Self-pay | Admitting: Adult Health

## 2020-06-06 DIAGNOSIS — R1084 Generalized abdominal pain: Secondary | ICD-10-CM

## 2020-06-06 NOTE — Telephone Encounter (Signed)
Order changed.

## 2020-06-06 NOTE — Telephone Encounter (Signed)
Left message informing order has been changed

## 2020-06-10 DIAGNOSIS — J301 Allergic rhinitis due to pollen: Secondary | ICD-10-CM | POA: Diagnosis not present

## 2020-06-12 DIAGNOSIS — J3081 Allergic rhinitis due to animal (cat) (dog) hair and dander: Secondary | ICD-10-CM | POA: Diagnosis not present

## 2020-06-12 DIAGNOSIS — J3089 Other allergic rhinitis: Secondary | ICD-10-CM | POA: Diagnosis not present

## 2020-06-14 DIAGNOSIS — J3089 Other allergic rhinitis: Secondary | ICD-10-CM | POA: Diagnosis not present

## 2020-06-14 DIAGNOSIS — J301 Allergic rhinitis due to pollen: Secondary | ICD-10-CM | POA: Diagnosis not present

## 2020-06-14 DIAGNOSIS — J3081 Allergic rhinitis due to animal (cat) (dog) hair and dander: Secondary | ICD-10-CM | POA: Diagnosis not present

## 2020-06-16 DIAGNOSIS — J3089 Other allergic rhinitis: Secondary | ICD-10-CM | POA: Diagnosis not present

## 2020-06-16 DIAGNOSIS — J301 Allergic rhinitis due to pollen: Secondary | ICD-10-CM | POA: Diagnosis not present

## 2020-06-16 DIAGNOSIS — J3081 Allergic rhinitis due to animal (cat) (dog) hair and dander: Secondary | ICD-10-CM | POA: Diagnosis not present

## 2020-06-19 DIAGNOSIS — J3089 Other allergic rhinitis: Secondary | ICD-10-CM | POA: Diagnosis not present

## 2020-06-19 DIAGNOSIS — J301 Allergic rhinitis due to pollen: Secondary | ICD-10-CM | POA: Diagnosis not present

## 2020-06-19 DIAGNOSIS — J3081 Allergic rhinitis due to animal (cat) (dog) hair and dander: Secondary | ICD-10-CM | POA: Diagnosis not present

## 2020-06-21 ENCOUNTER — Other Ambulatory Visit (HOSPITAL_COMMUNITY): Payer: Self-pay

## 2020-06-21 DIAGNOSIS — J3089 Other allergic rhinitis: Secondary | ICD-10-CM | POA: Diagnosis not present

## 2020-06-21 DIAGNOSIS — J301 Allergic rhinitis due to pollen: Secondary | ICD-10-CM | POA: Diagnosis not present

## 2020-06-21 DIAGNOSIS — J3081 Allergic rhinitis due to animal (cat) (dog) hair and dander: Secondary | ICD-10-CM | POA: Diagnosis not present

## 2020-06-21 MED FILL — Levocetirizine Dihydrochloride Tab 5 MG: ORAL | 30 days supply | Qty: 30 | Fill #0 | Status: AC

## 2020-06-22 ENCOUNTER — Other Ambulatory Visit: Payer: Self-pay

## 2020-06-22 ENCOUNTER — Ambulatory Visit
Admission: RE | Admit: 2020-06-22 | Discharge: 2020-06-22 | Disposition: A | Discharge status: Home / Self Care | Payer: 59 | Source: Ambulatory Visit | Attending: Adult Health | Admitting: Adult Health

## 2020-06-22 DIAGNOSIS — K760 Fatty (change of) liver, not elsewhere classified: Secondary | ICD-10-CM | POA: Diagnosis not present

## 2020-06-22 DIAGNOSIS — R1084 Generalized abdominal pain: Secondary | ICD-10-CM

## 2020-06-23 DIAGNOSIS — F902 Attention-deficit hyperactivity disorder, combined type: Secondary | ICD-10-CM | POA: Diagnosis not present

## 2020-06-23 DIAGNOSIS — J3089 Other allergic rhinitis: Secondary | ICD-10-CM | POA: Diagnosis not present

## 2020-06-23 DIAGNOSIS — J301 Allergic rhinitis due to pollen: Secondary | ICD-10-CM | POA: Diagnosis not present

## 2020-06-23 DIAGNOSIS — J3081 Allergic rhinitis due to animal (cat) (dog) hair and dander: Secondary | ICD-10-CM | POA: Diagnosis not present

## 2020-06-26 DIAGNOSIS — J3089 Other allergic rhinitis: Secondary | ICD-10-CM | POA: Diagnosis not present

## 2020-06-26 DIAGNOSIS — J3081 Allergic rhinitis due to animal (cat) (dog) hair and dander: Secondary | ICD-10-CM | POA: Diagnosis not present

## 2020-06-26 DIAGNOSIS — J301 Allergic rhinitis due to pollen: Secondary | ICD-10-CM | POA: Diagnosis not present

## 2020-06-27 ENCOUNTER — Other Ambulatory Visit (HOSPITAL_COMMUNITY): Payer: Self-pay

## 2020-06-27 MED FILL — Bupropion HCl Tab ER 24HR 150 MG: ORAL | 30 days supply | Qty: 30 | Fill #0 | Status: AC

## 2020-06-27 MED FILL — Lamotrigine Tab 25 MG: ORAL | 30 days supply | Qty: 90 | Fill #0 | Status: AC

## 2020-06-28 DIAGNOSIS — J3089 Other allergic rhinitis: Secondary | ICD-10-CM | POA: Diagnosis not present

## 2020-06-28 DIAGNOSIS — J301 Allergic rhinitis due to pollen: Secondary | ICD-10-CM | POA: Diagnosis not present

## 2020-06-28 DIAGNOSIS — J3081 Allergic rhinitis due to animal (cat) (dog) hair and dander: Secondary | ICD-10-CM | POA: Diagnosis not present

## 2020-07-03 DIAGNOSIS — J301 Allergic rhinitis due to pollen: Secondary | ICD-10-CM | POA: Diagnosis not present

## 2020-07-03 DIAGNOSIS — J3081 Allergic rhinitis due to animal (cat) (dog) hair and dander: Secondary | ICD-10-CM | POA: Diagnosis not present

## 2020-07-03 DIAGNOSIS — J3089 Other allergic rhinitis: Secondary | ICD-10-CM | POA: Diagnosis not present

## 2020-07-05 DIAGNOSIS — J3081 Allergic rhinitis due to animal (cat) (dog) hair and dander: Secondary | ICD-10-CM | POA: Diagnosis not present

## 2020-07-05 DIAGNOSIS — J3089 Other allergic rhinitis: Secondary | ICD-10-CM | POA: Diagnosis not present

## 2020-07-05 DIAGNOSIS — J301 Allergic rhinitis due to pollen: Secondary | ICD-10-CM | POA: Diagnosis not present

## 2020-07-07 DIAGNOSIS — J3089 Other allergic rhinitis: Secondary | ICD-10-CM | POA: Diagnosis not present

## 2020-07-07 DIAGNOSIS — J301 Allergic rhinitis due to pollen: Secondary | ICD-10-CM | POA: Diagnosis not present

## 2020-07-07 DIAGNOSIS — J3081 Allergic rhinitis due to animal (cat) (dog) hair and dander: Secondary | ICD-10-CM | POA: Diagnosis not present

## 2020-07-10 ENCOUNTER — Encounter: Payer: Self-pay | Admitting: Adult Health

## 2020-07-10 DIAGNOSIS — J3081 Allergic rhinitis due to animal (cat) (dog) hair and dander: Secondary | ICD-10-CM | POA: Diagnosis not present

## 2020-07-10 DIAGNOSIS — J3089 Other allergic rhinitis: Secondary | ICD-10-CM | POA: Diagnosis not present

## 2020-07-10 DIAGNOSIS — J301 Allergic rhinitis due to pollen: Secondary | ICD-10-CM | POA: Diagnosis not present

## 2020-07-11 ENCOUNTER — Telehealth (INDEPENDENT_AMBULATORY_CARE_PROVIDER_SITE_OTHER): Payer: 59 | Admitting: Adult Health

## 2020-07-11 ENCOUNTER — Encounter: Payer: Self-pay | Admitting: Adult Health

## 2020-07-11 ENCOUNTER — Other Ambulatory Visit (HOSPITAL_COMMUNITY): Payer: Self-pay

## 2020-07-11 DIAGNOSIS — G4709 Other insomnia: Secondary | ICD-10-CM | POA: Diagnosis not present

## 2020-07-11 MED ORDER — TRAZODONE HCL 50 MG PO TABS
50.0000 mg | ORAL_TABLET | Freq: Every day | ORAL | 0 refills | Status: DC
  Filled 2020-07-11: qty 90, 45d supply, fill #0

## 2020-07-11 NOTE — Progress Notes (Signed)
Virtual Visit via Video Note  I connected with Drue Dun on 07/11/20 at  4:30 PM EDT by a video enabled telemedicine application and verified that I am speaking with the correct person using two identifiers.  Location patient: home Location provider:work or home office Persons participating in the virtual visit: patient, provider  I discussed the limitations of evaluation and management by telemedicine and the availability of in person appointments. The patient expressed understanding and agreed to proceed.   HPI: 25 year old female who  has a past medical history of Acute appendicitis with localized peritonitis (10/28/2014), ADD (attention deficit disorder), Family history of adverse reaction to anesthesia, History of Clostridium difficile colitis, Infectious mononucleosis (01/25/2014), Irregular menses (11/08/2010), Knee injury (04/19/2011), Myopia, Pes planus, and Tonsillar hypertrophy.  Reports long time history of insomnia.  She has trouble falling asleep majority of the nights out of the week and trouble staying asleep every night.  She reports that she is up to about 30 mg of melatonin every night without good results, sometimes sleeping very few hours.  She has taken Ambien remotely in the past with pretty good results.  She would like to try something that she does not have to take every night.  She does practice good sleep hygiene    ROS: See pertinent positives and negatives per HPI.  Past Medical History:  Diagnosis Date  . Acute appendicitis with localized peritonitis 10/28/2014  . ADD (attention deficit disorder)   . Family history of adverse reaction to anesthesia    unknown---pt. is adopted  . History of Clostridium difficile colitis   . Infectious mononucleosis 01/25/2014   no obvious complication except she complains of severe pain in her throat not responsive to ibuprofen discussed  options pain medicines steroid risk benefit   . Irregular menses 11/08/2010  . Knee injury  04/19/2011   Poss traumatic bursitis tendinitis knee looks stable  .   Marland Kitchen Myopia   . Pes planus    consult [facility]  . Tonsillar hypertrophy     Past Surgical History:  Procedure Laterality Date  . APPENDECTOMY    . LAPAROSCOPIC APPENDECTOMY N/A 10/28/2014   Procedure: APPENDECTOMY LAPAROSCOPIC;  Surgeon: Excell Seltzer, MD;  Location: [facility];  Service: General;  Laterality: N/A;  . MYRINGOTOMY    . TONSILLECTOMY Bilateral 03/19/2017   Procedure: TONSILLECTOMY;  Surgeon: Melissa Montane, MD;  Location: [facility];  Service: ENT;  Laterality: Bilateral;    Family History  Adopted: Yes  Problem Relation Age of Onset  . ADD / ADHD Brother        possible ld       Current Outpatient Medications:  .  buPROPion (WELLBUTRIN XL) 150 MG 24 hr tablet, TAKE 1 TABLET BY MOUTH EVERY MORNING, Disp: 30 tablet, Rfl: 5 .  drospirenone-ethinyl estradiol (YAZ) 3-0.02 MG tablet, TAKE 1 TABLET BY MOUTH EVERY DAY -TAKE CONTINUOUSLY SKIP PLACEBOS, Disp: 112 tablet, Rfl: 3 .  EPINEPHrine 0.3 mg/0.3 mL IJ SOAJ injection, USE AS DIRECTED AS NEEDED, Disp: 2 each, Rfl: 1 .  lamoTRIgine (LAMICTAL) 25 MG tablet, TAKE 3 TABLETS BY MOUTH ONCE A DAY, Disp: 90 tablet, Rfl: 99 .  levocetirizine (XYZAL) 5 MG tablet, SMARTSIG:1 Tablet(s) By Mouth Every Evening, Disp: , Rfl:  .  lisdexamfetamine (VYVANSE) 40 MG capsule, TAKE 1 CAPSULE BY MOUTH EVERY MORNING, Disp: 90 capsule, Rfl: 0 .  omeprazole (PRILOSEC) 40 MG capsule, TAKE 1 CAPSULE BY MOUTH ONCE A DAY, Disp: 90 capsule, Rfl: 1 .  traZODone (DESYREL) 50 MG tablet, Take 1-2 tablets (50-100 mg total) by mouth at bedtime., Disp: 90 tablet, Rfl: 0  EXAM:  VITALS per patient if applicable:  GENERAL: alert, oriented, appears well and in no acute distress  HEENT: atraumatic, conjunttiva clear, no obvious abnormalities on inspection of external nose and ears  NECK: normal movements of the head and neck  LUNGS: on inspection no signs of respiratory  distress, breathing rate appears normal, no obvious gross SOB, gasping or wheezing  CV: no obvious cyanosis  MS: moves all visible extremities without noticeable abnormality  PSYCH/NEURO: pleasant and cooperative, no obvious depression or anxiety, speech and thought processing grossly intact  ASSESSMENT AND PLAN:  Discussed the following assessment and plan:  1. Other insomnia - Will trial her on trazodone. Start with 50 mg and can increase to 100 mg if needed - traZODone (DESYREL) 50 MG tablet; Take 1-2 tablets (50-100 mg total) by mouth at bedtime.  Dispense: 90 tablet; Refill: 0     I discussed the assessment and treatment plan with the patient. The patient was provided an opportunity to ask questions and all were answered. The patient agreed with the plan and demonstrated an understanding of the instructions.   The patient was advised to call back or seek an in-person evaluation if the symptoms worsen or if the condition fails to improve as anticipated.   Dorothyann Peng, NP

## 2020-07-12 DIAGNOSIS — J3089 Other allergic rhinitis: Secondary | ICD-10-CM | POA: Diagnosis not present

## 2020-07-12 DIAGNOSIS — J3081 Allergic rhinitis due to animal (cat) (dog) hair and dander: Secondary | ICD-10-CM | POA: Diagnosis not present

## 2020-07-12 DIAGNOSIS — J301 Allergic rhinitis due to pollen: Secondary | ICD-10-CM | POA: Diagnosis not present

## 2020-07-14 DIAGNOSIS — J3081 Allergic rhinitis due to animal (cat) (dog) hair and dander: Secondary | ICD-10-CM | POA: Diagnosis not present

## 2020-07-14 DIAGNOSIS — J301 Allergic rhinitis due to pollen: Secondary | ICD-10-CM | POA: Diagnosis not present

## 2020-07-14 DIAGNOSIS — J3089 Other allergic rhinitis: Secondary | ICD-10-CM | POA: Diagnosis not present

## 2020-07-17 DIAGNOSIS — J3089 Other allergic rhinitis: Secondary | ICD-10-CM | POA: Diagnosis not present

## 2020-07-17 DIAGNOSIS — J301 Allergic rhinitis due to pollen: Secondary | ICD-10-CM | POA: Diagnosis not present

## 2020-07-17 DIAGNOSIS — J3081 Allergic rhinitis due to animal (cat) (dog) hair and dander: Secondary | ICD-10-CM | POA: Diagnosis not present

## 2020-07-19 DIAGNOSIS — J3089 Other allergic rhinitis: Secondary | ICD-10-CM | POA: Diagnosis not present

## 2020-07-19 DIAGNOSIS — J301 Allergic rhinitis due to pollen: Secondary | ICD-10-CM | POA: Diagnosis not present

## 2020-07-20 ENCOUNTER — Encounter: Payer: Self-pay | Admitting: Adult Health

## 2020-07-20 ENCOUNTER — Other Ambulatory Visit: Payer: Self-pay | Admitting: Adult Health

## 2020-07-20 ENCOUNTER — Other Ambulatory Visit (HOSPITAL_COMMUNITY): Payer: Self-pay

## 2020-07-20 MED ORDER — ZOLPIDEM TARTRATE 5 MG PO TABS
5.0000 mg | ORAL_TABLET | Freq: Every evening | ORAL | 1 refills | Status: DC | PRN
  Filled 2020-07-20: qty 15, 15d supply, fill #0

## 2020-07-21 DIAGNOSIS — J3089 Other allergic rhinitis: Secondary | ICD-10-CM | POA: Diagnosis not present

## 2020-07-21 DIAGNOSIS — J3081 Allergic rhinitis due to animal (cat) (dog) hair and dander: Secondary | ICD-10-CM | POA: Diagnosis not present

## 2020-07-21 DIAGNOSIS — J301 Allergic rhinitis due to pollen: Secondary | ICD-10-CM | POA: Diagnosis not present

## 2020-07-26 ENCOUNTER — Other Ambulatory Visit (HOSPITAL_COMMUNITY): Payer: Self-pay

## 2020-07-26 ENCOUNTER — Other Ambulatory Visit: Payer: Self-pay | Admitting: Adult Health

## 2020-07-26 MED ORDER — ZOLPIDEM TARTRATE 10 MG PO TABS
10.0000 mg | ORAL_TABLET | Freq: Every evening | ORAL | 1 refills | Status: DC | PRN
  Filled 2020-07-26: qty 15, 15d supply, fill #0
  Filled 2020-09-24: qty 15, 15d supply, fill #1

## 2020-07-31 ENCOUNTER — Other Ambulatory Visit (HOSPITAL_COMMUNITY): Payer: Self-pay

## 2020-07-31 ENCOUNTER — Other Ambulatory Visit (HOSPITAL_COMMUNITY): Payer: Self-pay | Admitting: Psychiatry

## 2020-07-31 DIAGNOSIS — J3081 Allergic rhinitis due to animal (cat) (dog) hair and dander: Secondary | ICD-10-CM | POA: Diagnosis not present

## 2020-07-31 DIAGNOSIS — J301 Allergic rhinitis due to pollen: Secondary | ICD-10-CM | POA: Diagnosis not present

## 2020-07-31 DIAGNOSIS — J3089 Other allergic rhinitis: Secondary | ICD-10-CM | POA: Diagnosis not present

## 2020-07-31 MED FILL — Drospirenone-Ethinyl Estradiol Tab 3-0.02 MG: ORAL | 90 days supply | Qty: 112 | Fill #0 | Status: AC

## 2020-07-31 MED FILL — Lamotrigine Tab 25 MG: ORAL | 30 days supply | Qty: 90 | Fill #1 | Status: AC

## 2020-07-31 MED FILL — Omeprazole Cap Delayed Release 40 MG: ORAL | 90 days supply | Qty: 90 | Fill #0 | Status: AC

## 2020-08-02 ENCOUNTER — Other Ambulatory Visit (HOSPITAL_COMMUNITY): Payer: Self-pay

## 2020-08-02 ENCOUNTER — Other Ambulatory Visit (HOSPITAL_COMMUNITY): Payer: Self-pay | Admitting: Psychiatry

## 2020-08-03 ENCOUNTER — Other Ambulatory Visit (HOSPITAL_COMMUNITY): Payer: Self-pay

## 2020-08-03 DIAGNOSIS — J3081 Allergic rhinitis due to animal (cat) (dog) hair and dander: Secondary | ICD-10-CM | POA: Diagnosis not present

## 2020-08-03 DIAGNOSIS — J301 Allergic rhinitis due to pollen: Secondary | ICD-10-CM | POA: Diagnosis not present

## 2020-08-03 DIAGNOSIS — J3089 Other allergic rhinitis: Secondary | ICD-10-CM | POA: Diagnosis not present

## 2020-08-04 ENCOUNTER — Encounter: Payer: Self-pay | Admitting: Adult Health

## 2020-08-07 ENCOUNTER — Other Ambulatory Visit (HOSPITAL_COMMUNITY): Payer: Self-pay

## 2020-08-07 DIAGNOSIS — J3081 Allergic rhinitis due to animal (cat) (dog) hair and dander: Secondary | ICD-10-CM | POA: Diagnosis not present

## 2020-08-07 DIAGNOSIS — J3089 Other allergic rhinitis: Secondary | ICD-10-CM | POA: Diagnosis not present

## 2020-08-07 DIAGNOSIS — J301 Allergic rhinitis due to pollen: Secondary | ICD-10-CM | POA: Diagnosis not present

## 2020-08-08 ENCOUNTER — Other Ambulatory Visit (HOSPITAL_COMMUNITY): Payer: Self-pay

## 2020-08-09 ENCOUNTER — Other Ambulatory Visit (HOSPITAL_COMMUNITY): Payer: Self-pay

## 2020-08-10 DIAGNOSIS — J3089 Other allergic rhinitis: Secondary | ICD-10-CM | POA: Diagnosis not present

## 2020-08-10 DIAGNOSIS — J3081 Allergic rhinitis due to animal (cat) (dog) hair and dander: Secondary | ICD-10-CM | POA: Diagnosis not present

## 2020-08-10 DIAGNOSIS — J301 Allergic rhinitis due to pollen: Secondary | ICD-10-CM | POA: Diagnosis not present

## 2020-08-15 DIAGNOSIS — J3081 Allergic rhinitis due to animal (cat) (dog) hair and dander: Secondary | ICD-10-CM | POA: Diagnosis not present

## 2020-08-15 DIAGNOSIS — J3089 Other allergic rhinitis: Secondary | ICD-10-CM | POA: Diagnosis not present

## 2020-08-15 DIAGNOSIS — J301 Allergic rhinitis due to pollen: Secondary | ICD-10-CM | POA: Diagnosis not present

## 2020-08-16 ENCOUNTER — Telehealth: Payer: Self-pay | Admitting: Adult Health

## 2020-08-16 ENCOUNTER — Other Ambulatory Visit (HOSPITAL_COMMUNITY): Payer: Self-pay

## 2020-08-16 MED ORDER — BUPROPION HCL ER (XL) 150 MG PO TB24
ORAL_TABLET | Freq: Every morning | ORAL | 1 refills | Status: DC
  Filled 2020-08-16: qty 90, 90d supply, fill #0
  Filled 2020-11-12: qty 90, 90d supply, fill #1

## 2020-08-16 MED ORDER — LAMOTRIGINE 25 MG PO TABS
75.0000 mg | ORAL_TABLET | Freq: Every day | ORAL | 1 refills | Status: DC
  Filled 2020-08-16 – 2020-09-10 (×2): qty 270, 90d supply, fill #0

## 2020-08-16 NOTE — Telephone Encounter (Signed)
Spoke to patient to inform me that her psychiatrist is no longer willing to fill her medications of Lamictal and Wellbutrin due to primary care prescribing her Ambien and Vyvanse despite being on Vyvanse for multiple years.  She does have a therapist who she does talk therapy with and finds this useful but is wondering since she is well maintained on her current course of medications if we will prescribe this for her.   I am okay with prescribing her Wellbutrin and Lamictal since she is well controlled.  Advised to continue with seeing her therapist

## 2020-08-18 DIAGNOSIS — F902 Attention-deficit hyperactivity disorder, combined type: Secondary | ICD-10-CM | POA: Diagnosis not present

## 2020-08-22 DIAGNOSIS — J301 Allergic rhinitis due to pollen: Secondary | ICD-10-CM | POA: Diagnosis not present

## 2020-08-22 DIAGNOSIS — J3081 Allergic rhinitis due to animal (cat) (dog) hair and dander: Secondary | ICD-10-CM | POA: Diagnosis not present

## 2020-08-22 DIAGNOSIS — J3089 Other allergic rhinitis: Secondary | ICD-10-CM | POA: Diagnosis not present

## 2020-08-29 DIAGNOSIS — J301 Allergic rhinitis due to pollen: Secondary | ICD-10-CM | POA: Diagnosis not present

## 2020-08-29 DIAGNOSIS — J3081 Allergic rhinitis due to animal (cat) (dog) hair and dander: Secondary | ICD-10-CM | POA: Diagnosis not present

## 2020-08-29 DIAGNOSIS — J3089 Other allergic rhinitis: Secondary | ICD-10-CM | POA: Diagnosis not present

## 2020-08-31 IMAGING — US ULTRASOUND LEFT BREAST LIMITED
1 series · 6 of 6 positions shown · non-contrast
Comparison: Previous exam(s).

CLINICAL DATA: 22-year-old female with a biopsy proven left breast
fibroadenoma, with questionable interval enlargement. The patient
states there is associated tenderness and she would like to
eventually have this area removed.

EXAM:
ULTRASOUND OF THE LEFT BREAST

[Series 1: ultrasound left breast limited · 0.07mm/px · 6 of 6 slices shown]
[im 1/6]
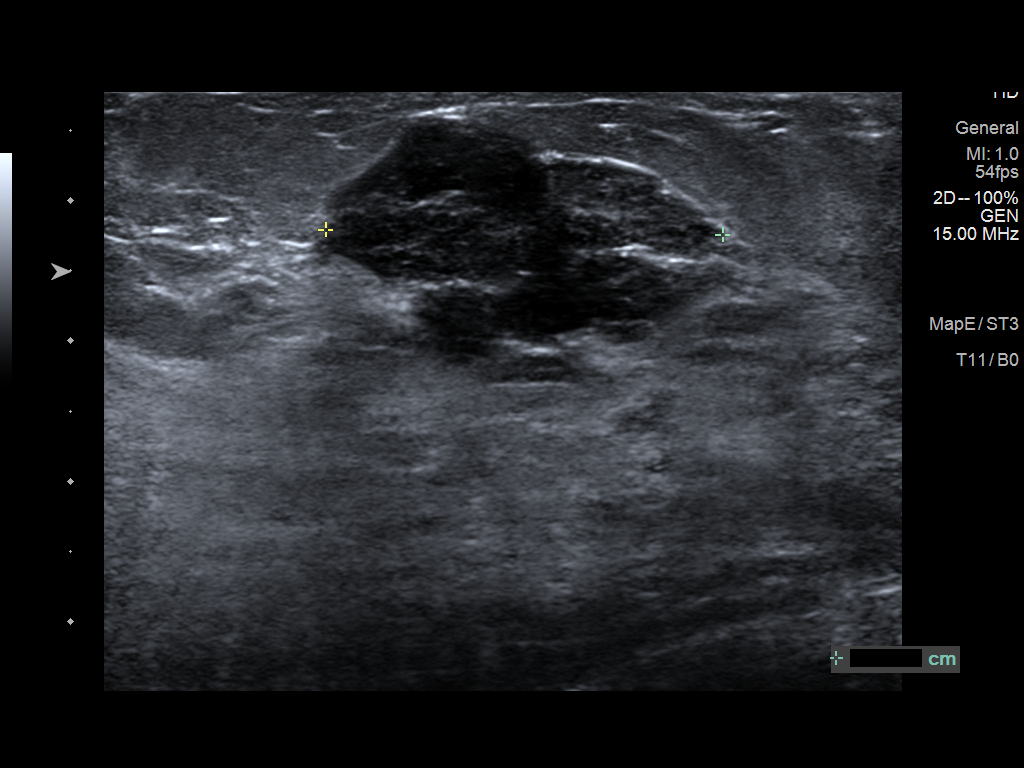
[im 2/6]
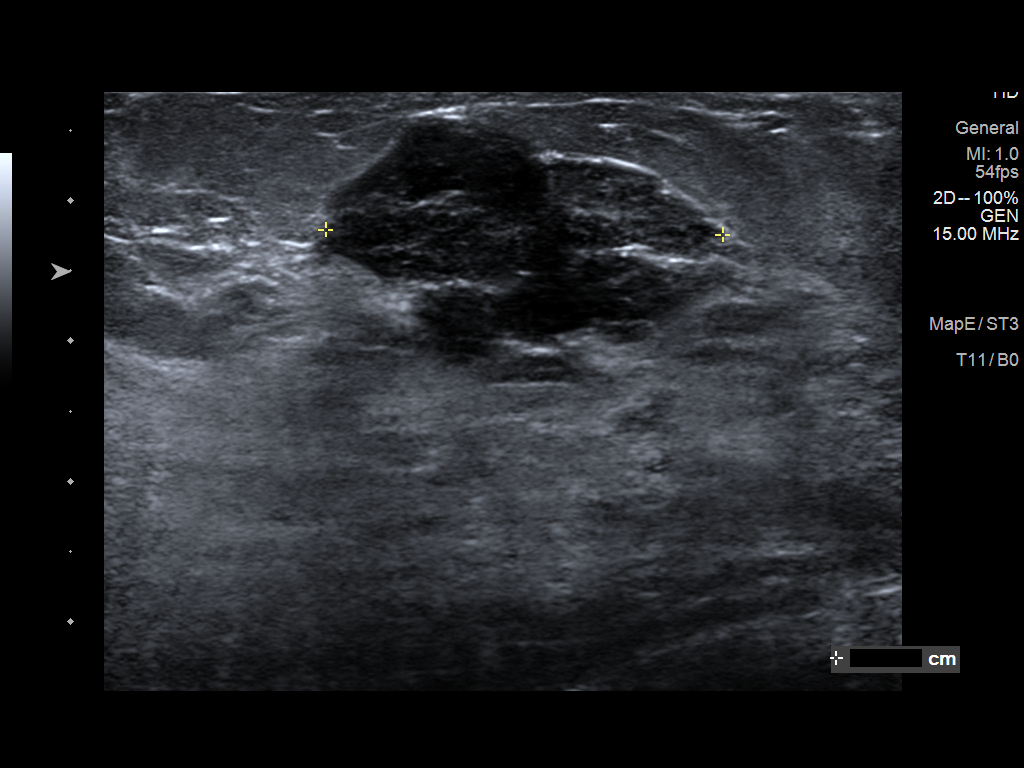
[im 3/6]
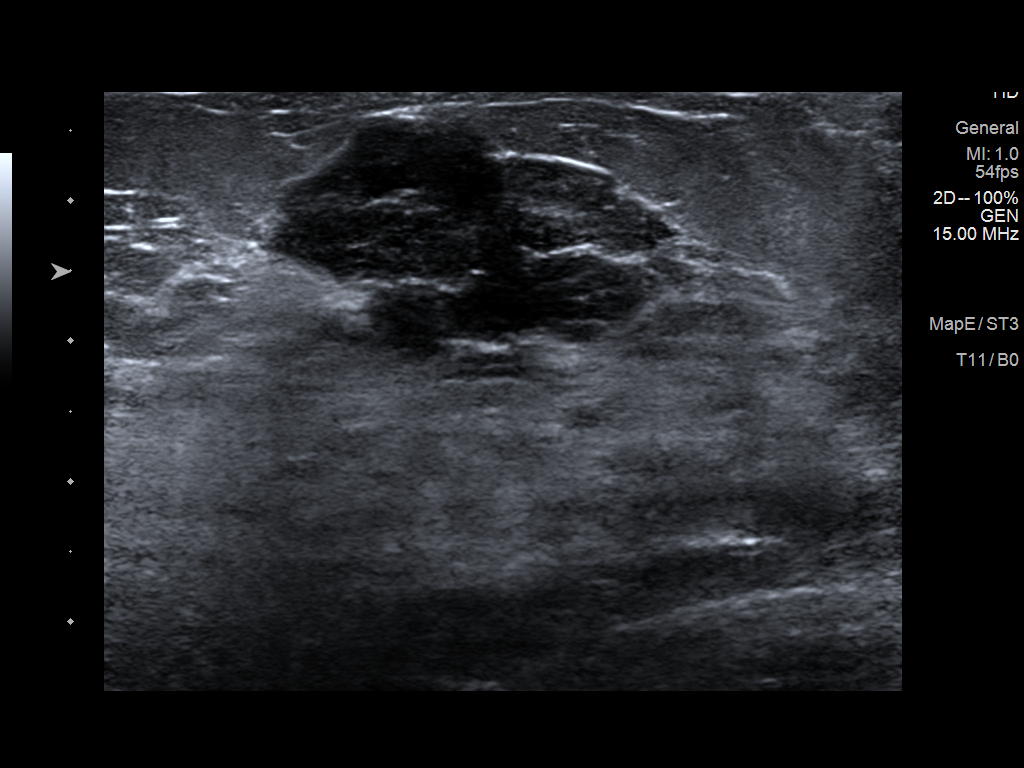
[im 4/6]
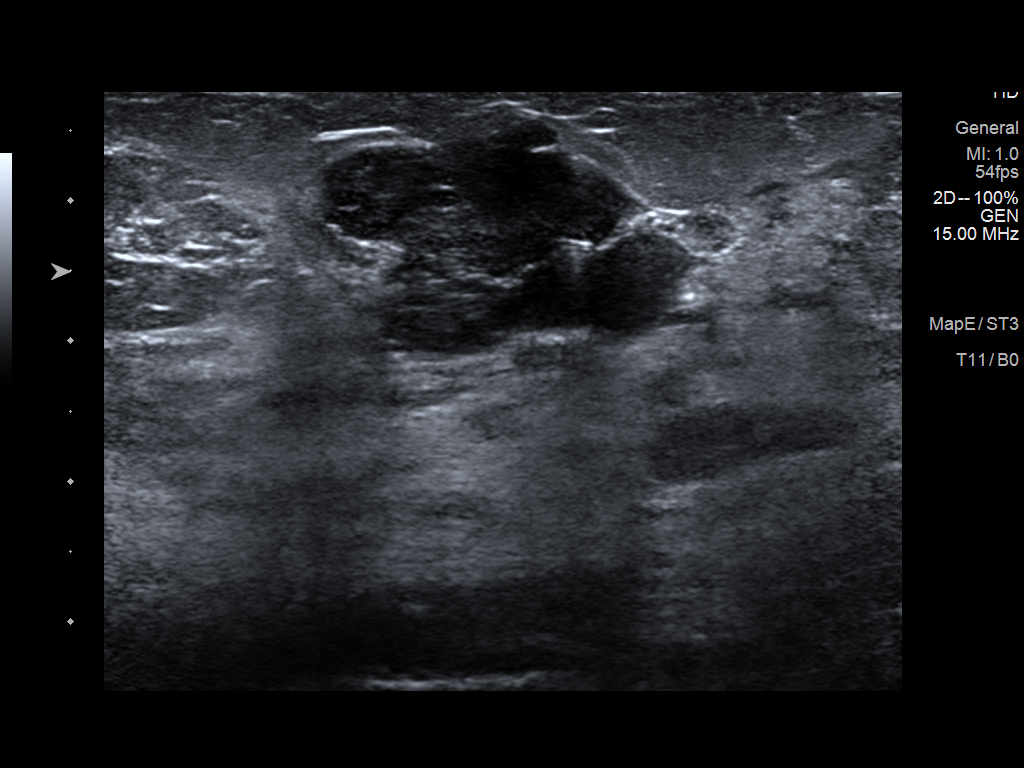
[im 5/6]
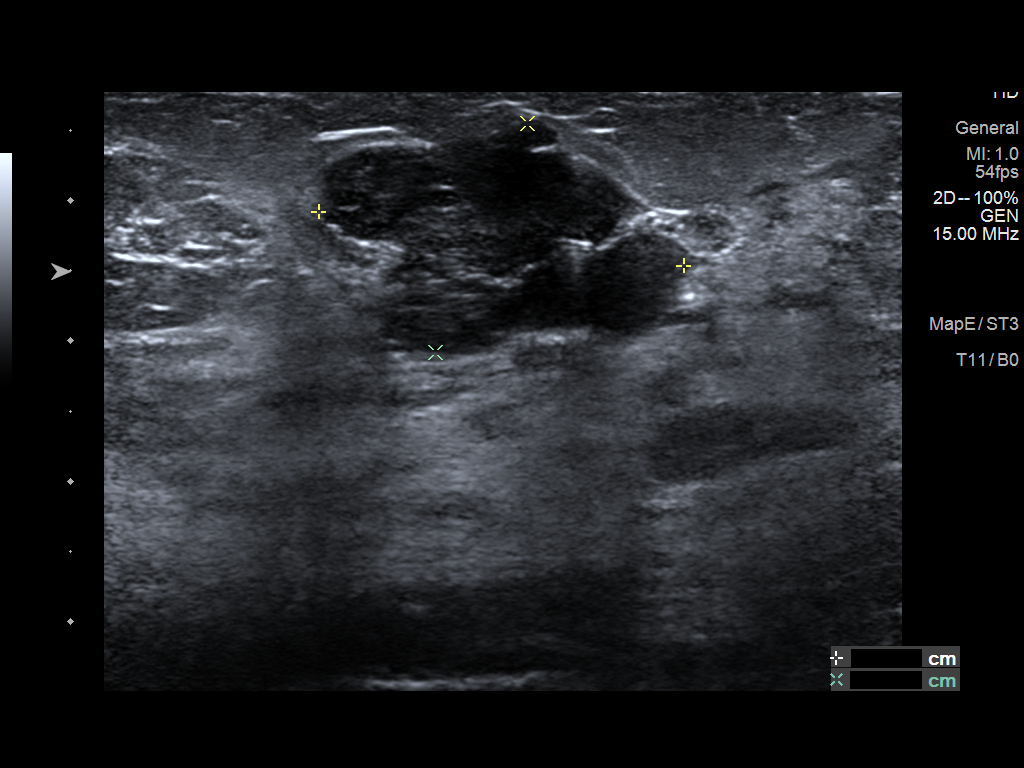
[im 6/6]
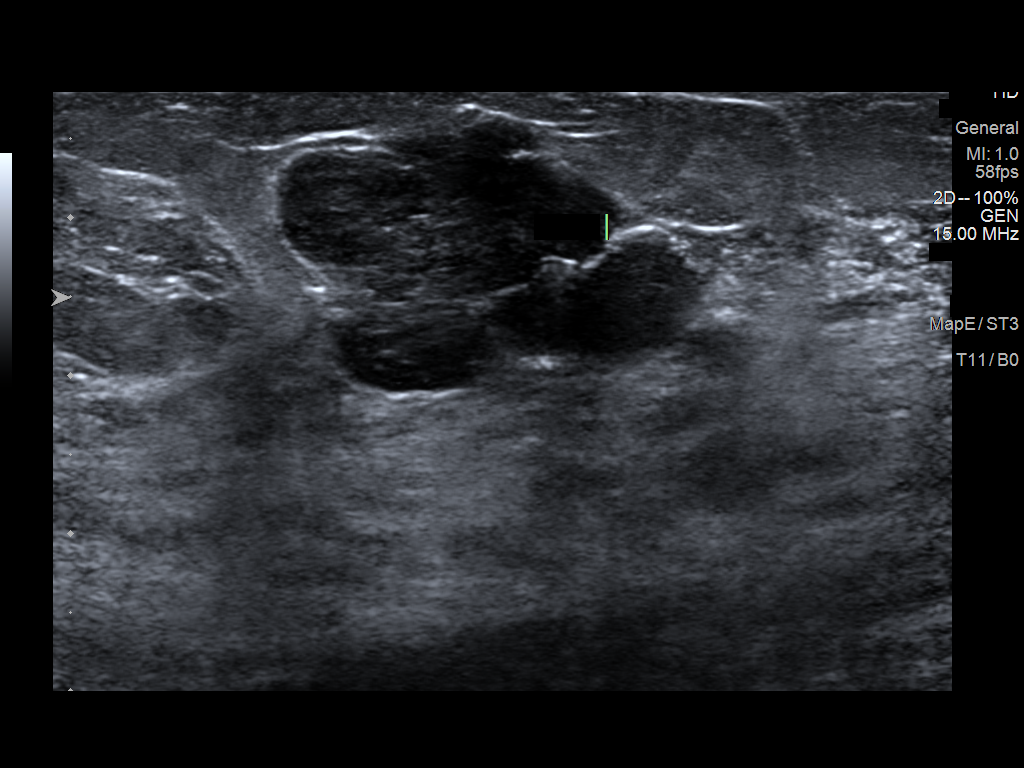

[6 of 6 positions shown; findings below may reference images not displayed]

FINDINGS: Targeted ultrasound is performed, showing grossly stable appearance
of a lobulated hypoechoic mass at the [DATE] position 4 cm from the
nipple. It measures 2.8 x 2.6 x 1.8 cm (previously 3 x 3 x 2.5 cm.
IMPRESSION: Stable appearance of the previously biopsy proven fibroadenoma.

RECOMMENDATION:
Clinical and symptomatic follow-up.

Screening mammogram at age 42 unless there are persistent or
intervening clinical concerns. (Code:[id number]-M-30R)

I have discussed the findings and recommendations with the patient.
Results were also provided in writing at the conclusion of the
visit. If applicable, a reminder letter will be sent to the patient
regarding the next appointment.

BI-RADS CATEGORY  2: Benign.

## 2020-09-05 DIAGNOSIS — J3081 Allergic rhinitis due to animal (cat) (dog) hair and dander: Secondary | ICD-10-CM | POA: Diagnosis not present

## 2020-09-05 DIAGNOSIS — J3089 Other allergic rhinitis: Secondary | ICD-10-CM | POA: Diagnosis not present

## 2020-09-05 DIAGNOSIS — J301 Allergic rhinitis due to pollen: Secondary | ICD-10-CM | POA: Diagnosis not present

## 2020-09-10 ENCOUNTER — Other Ambulatory Visit: Payer: Self-pay | Admitting: Internal Medicine

## 2020-09-11 ENCOUNTER — Other Ambulatory Visit (HOSPITAL_COMMUNITY): Payer: Self-pay

## 2020-09-12 ENCOUNTER — Other Ambulatory Visit (HOSPITAL_COMMUNITY): Payer: Self-pay

## 2020-09-12 ENCOUNTER — Other Ambulatory Visit: Payer: Self-pay | Admitting: Internal Medicine

## 2020-09-12 NOTE — Telephone Encounter (Signed)
Okay for refill?    LOV 05/02/2020   Last Refill  90 QTY.  0 Refills

## 2020-09-13 ENCOUNTER — Other Ambulatory Visit (HOSPITAL_COMMUNITY): Payer: Self-pay

## 2020-09-13 DIAGNOSIS — J3081 Allergic rhinitis due to animal (cat) (dog) hair and dander: Secondary | ICD-10-CM | POA: Diagnosis not present

## 2020-09-13 DIAGNOSIS — J301 Allergic rhinitis due to pollen: Secondary | ICD-10-CM | POA: Diagnosis not present

## 2020-09-13 DIAGNOSIS — J3089 Other allergic rhinitis: Secondary | ICD-10-CM | POA: Diagnosis not present

## 2020-09-13 MED ORDER — LISDEXAMFETAMINE DIMESYLATE 40 MG PO CAPS
ORAL_CAPSULE | Freq: Every morning | ORAL | 0 refills | Status: DC
  Filled 2020-09-13: qty 90, 90d supply, fill #0

## 2020-09-14 ENCOUNTER — Other Ambulatory Visit (HOSPITAL_COMMUNITY): Payer: Self-pay

## 2020-09-19 DIAGNOSIS — J3081 Allergic rhinitis due to animal (cat) (dog) hair and dander: Secondary | ICD-10-CM | POA: Diagnosis not present

## 2020-09-19 DIAGNOSIS — J3089 Other allergic rhinitis: Secondary | ICD-10-CM | POA: Diagnosis not present

## 2020-09-19 DIAGNOSIS — J301 Allergic rhinitis due to pollen: Secondary | ICD-10-CM | POA: Diagnosis not present

## 2020-09-22 DIAGNOSIS — J301 Allergic rhinitis due to pollen: Secondary | ICD-10-CM | POA: Diagnosis not present

## 2020-09-22 DIAGNOSIS — J3089 Other allergic rhinitis: Secondary | ICD-10-CM | POA: Diagnosis not present

## 2020-09-22 DIAGNOSIS — J3081 Allergic rhinitis due to animal (cat) (dog) hair and dander: Secondary | ICD-10-CM | POA: Diagnosis not present

## 2020-09-25 ENCOUNTER — Other Ambulatory Visit (HOSPITAL_COMMUNITY): Payer: Self-pay

## 2020-09-28 DIAGNOSIS — J3089 Other allergic rhinitis: Secondary | ICD-10-CM | POA: Diagnosis not present

## 2020-09-28 DIAGNOSIS — J301 Allergic rhinitis due to pollen: Secondary | ICD-10-CM | POA: Diagnosis not present

## 2020-09-28 DIAGNOSIS — J3081 Allergic rhinitis due to animal (cat) (dog) hair and dander: Secondary | ICD-10-CM | POA: Diagnosis not present

## 2020-10-02 DIAGNOSIS — J3081 Allergic rhinitis due to animal (cat) (dog) hair and dander: Secondary | ICD-10-CM | POA: Diagnosis not present

## 2020-10-02 DIAGNOSIS — J301 Allergic rhinitis due to pollen: Secondary | ICD-10-CM | POA: Diagnosis not present

## 2020-10-02 DIAGNOSIS — J3089 Other allergic rhinitis: Secondary | ICD-10-CM | POA: Diagnosis not present

## 2020-10-03 DIAGNOSIS — J301 Allergic rhinitis due to pollen: Secondary | ICD-10-CM | POA: Diagnosis not present

## 2020-10-03 DIAGNOSIS — J3089 Other allergic rhinitis: Secondary | ICD-10-CM | POA: Diagnosis not present

## 2020-10-03 DIAGNOSIS — J3081 Allergic rhinitis due to animal (cat) (dog) hair and dander: Secondary | ICD-10-CM | POA: Diagnosis not present

## 2020-10-03 DIAGNOSIS — L501 Idiopathic urticaria: Secondary | ICD-10-CM | POA: Diagnosis not present

## 2020-10-04 ENCOUNTER — Other Ambulatory Visit (HOSPITAL_COMMUNITY): Payer: Self-pay

## 2020-10-04 DIAGNOSIS — J3081 Allergic rhinitis due to animal (cat) (dog) hair and dander: Secondary | ICD-10-CM | POA: Diagnosis not present

## 2020-10-04 DIAGNOSIS — Z01419 Encounter for gynecological examination (general) (routine) without abnormal findings: Secondary | ICD-10-CM | POA: Diagnosis not present

## 2020-10-04 DIAGNOSIS — J301 Allergic rhinitis due to pollen: Secondary | ICD-10-CM | POA: Diagnosis not present

## 2020-10-04 DIAGNOSIS — J3089 Other allergic rhinitis: Secondary | ICD-10-CM | POA: Diagnosis not present

## 2020-10-04 MED ORDER — DROSPIRENONE-ETHINYL ESTRADIOL 3-0.02 MG PO TABS
1.0000 | ORAL_TABLET | Freq: Every day | ORAL | 3 refills | Status: DC
  Filled 2020-10-04: qty 112, 84d supply, fill #0
  Filled 2021-01-07: qty 84, 72d supply, fill #0
  Filled 2021-04-17: qty 84, 72d supply, fill #1
  Filled 2021-07-06: qty 112, 84d supply, fill #2

## 2020-10-09 DIAGNOSIS — J3089 Other allergic rhinitis: Secondary | ICD-10-CM | POA: Diagnosis not present

## 2020-10-09 DIAGNOSIS — J301 Allergic rhinitis due to pollen: Secondary | ICD-10-CM | POA: Diagnosis not present

## 2020-10-09 DIAGNOSIS — J3081 Allergic rhinitis due to animal (cat) (dog) hair and dander: Secondary | ICD-10-CM | POA: Diagnosis not present

## 2020-10-10 DIAGNOSIS — J3081 Allergic rhinitis due to animal (cat) (dog) hair and dander: Secondary | ICD-10-CM | POA: Diagnosis not present

## 2020-10-10 DIAGNOSIS — F902 Attention-deficit hyperactivity disorder, combined type: Secondary | ICD-10-CM | POA: Diagnosis not present

## 2020-10-10 DIAGNOSIS — J3089 Other allergic rhinitis: Secondary | ICD-10-CM | POA: Diagnosis not present

## 2020-10-11 ENCOUNTER — Other Ambulatory Visit (HOSPITAL_COMMUNITY): Payer: Self-pay

## 2020-10-11 DIAGNOSIS — J3089 Other allergic rhinitis: Secondary | ICD-10-CM | POA: Diagnosis not present

## 2020-10-11 DIAGNOSIS — J3081 Allergic rhinitis due to animal (cat) (dog) hair and dander: Secondary | ICD-10-CM | POA: Diagnosis not present

## 2020-10-11 DIAGNOSIS — J301 Allergic rhinitis due to pollen: Secondary | ICD-10-CM | POA: Diagnosis not present

## 2020-10-17 DIAGNOSIS — J301 Allergic rhinitis due to pollen: Secondary | ICD-10-CM | POA: Diagnosis not present

## 2020-10-17 DIAGNOSIS — J3081 Allergic rhinitis due to animal (cat) (dog) hair and dander: Secondary | ICD-10-CM | POA: Diagnosis not present

## 2020-10-17 DIAGNOSIS — J3089 Other allergic rhinitis: Secondary | ICD-10-CM | POA: Diagnosis not present

## 2020-10-19 ENCOUNTER — Other Ambulatory Visit (HOSPITAL_COMMUNITY): Payer: Self-pay

## 2020-10-23 DIAGNOSIS — J3089 Other allergic rhinitis: Secondary | ICD-10-CM | POA: Diagnosis not present

## 2020-10-23 DIAGNOSIS — J3081 Allergic rhinitis due to animal (cat) (dog) hair and dander: Secondary | ICD-10-CM | POA: Diagnosis not present

## 2020-10-23 DIAGNOSIS — J301 Allergic rhinitis due to pollen: Secondary | ICD-10-CM | POA: Diagnosis not present

## 2020-10-25 DIAGNOSIS — J301 Allergic rhinitis due to pollen: Secondary | ICD-10-CM | POA: Diagnosis not present

## 2020-10-25 DIAGNOSIS — J3089 Other allergic rhinitis: Secondary | ICD-10-CM | POA: Diagnosis not present

## 2020-10-25 DIAGNOSIS — J3081 Allergic rhinitis due to animal (cat) (dog) hair and dander: Secondary | ICD-10-CM | POA: Diagnosis not present

## 2020-10-30 ENCOUNTER — Encounter: Payer: Self-pay | Admitting: Adult Health

## 2020-10-31 ENCOUNTER — Telehealth: Payer: 59 | Admitting: Adult Health

## 2020-11-01 ENCOUNTER — Telehealth: Payer: 59 | Admitting: Adult Health

## 2020-11-01 ENCOUNTER — Encounter: Payer: Self-pay | Admitting: Adult Health

## 2020-11-01 ENCOUNTER — Other Ambulatory Visit (HOSPITAL_COMMUNITY): Payer: Self-pay

## 2020-11-01 VITALS — Ht 67.0 in | Wt 193.0 lb

## 2020-11-01 DIAGNOSIS — F39 Unspecified mood [affective] disorder: Secondary | ICD-10-CM | POA: Diagnosis not present

## 2020-11-01 DIAGNOSIS — G4709 Other insomnia: Secondary | ICD-10-CM

## 2020-11-01 MED ORDER — LAMOTRIGINE 100 MG PO TABS
100.0000 mg | ORAL_TABLET | Freq: Every day | ORAL | 0 refills | Status: DC
  Filled 2020-11-01: qty 90, 90d supply, fill #0

## 2020-11-01 MED ORDER — ZOLPIDEM TARTRATE 10 MG PO TABS
10.0000 mg | ORAL_TABLET | Freq: Every evening | ORAL | 1 refills | Status: DC | PRN
  Filled 2020-11-01: qty 15, 15d supply, fill #0
  Filled 2020-12-28: qty 15, 15d supply, fill #1

## 2020-11-01 NOTE — Progress Notes (Signed)
Virtual Visit via Video Note  I connected with Lisa Hines on 11/01/20 at 11:00 AM EDT by a video enabled telemedicine application and verified that I am speaking with the correct person using two identifiers.  Location patient: home Location provider:work or home office Persons participating in the virtual visit: patient, provider  I discussed the limitations of evaluation and management by telemedicine and the availability of in person appointments. The patient expressed understanding and agreed to proceed.   HPI: 25 year old female who is being evaluated today for mood disorder.  In the past she has been seen by psychiatry and has been on Wellbutrin 150 mg and Lamictal 75 mg.  Since we are other prescribers for her Ambien and Vyvanse, psychiatry advised her to have her PCP manage her psychiatric conditions.  She reports that she has been well controlled on these 2 medications but over the last couple months she has started to notice that she has been having more "mood swings" and is getting irritated and snappy with her family, specifically her mom.  Yesterday while on vacation she found herself getting irritated and crying because her office did not look good.   ROS: See pertinent positives and negatives per HPI.  Past Medical History:  Diagnosis Date   Acute appendicitis with localized peritonitis 10/28/2014   ADD (attention deficit disorder)    Family history of adverse reaction to anesthesia    unknown---pt. is adopted   History of Clostridium difficile colitis    Infectious mononucleosis 01/25/2014   no obvious complication except she complains of severe pain in her throat not responsive to ibuprofen discussed  options pain medicines steroid risk benefit    Irregular menses 11/08/2010   Knee injury 04/19/2011   Poss traumatic bursitis tendinitis knee looks stable  .    Myopia    Pes planus    consult [facility]   Tonsillar hypertrophy     Past Surgical History:  Procedure Laterality  Date   APPENDECTOMY     LAPAROSCOPIC APPENDECTOMY N/A 10/28/2014   Procedure: APPENDECTOMY LAPAROSCOPIC;  Surgeon: Excell Seltzer, MD;  Location: [facility];  Service: General;  Laterality: N/A;   MYRINGOTOMY     TONSILLECTOMY Bilateral 03/19/2017   Procedure: TONSILLECTOMY;  Surgeon: Melissa Montane, MD;  Location: [facility];  Service: ENT;  Laterality: Bilateral;    Family History  Adopted: Yes  Problem Relation Age of Onset   ADD / ADHD Brother        possible ld       Current Outpatient Medications:    buPROPion (WELLBUTRIN XL) 150 MG 24 hr tablet, TAKE 1 TABLET BY MOUTH EVERY MORNING, Disp: 90 tablet, Rfl: 1   drospirenone-ethinyl estradiol (JASMIEL) 3-0.02 MG tablet, Take 1 tablet by mouth daily. Take continuously skip placebos., Disp: 112 tablet, Rfl: 3   EPINEPHrine 0.3 mg/0.3 mL IJ SOAJ injection, USE AS DIRECTED AS NEEDED, Disp: 2 each, Rfl: 1   lamoTRIgine (LAMICTAL) 100 MG tablet, Take 1 tablet (100 mg total) by mouth daily., Disp: 90 tablet, Rfl: 0   lisdexamfetamine (VYVANSE) 40 MG capsule, Take 1 capsule by mouth every morning., Disp: 90 capsule, Rfl: 0   omeprazole (PRILOSEC) 40 MG capsule, TAKE 1 CAPSULE BY MOUTH ONCE A DAY, Disp: 90 capsule, Rfl: 1   zolpidem (AMBIEN) 10 MG tablet, Take 1 tablet (10 mg total) by mouth at bedtime as needed for sleep., Disp: 15 tablet, Rfl: 1  EXAM:  VITALS per patient if applicable:  GENERAL: alert, oriented, appears  well and in no acute distress  HEENT: atraumatic, conjunttiva clear, no obvious abnormalities on inspection of external nose and ears  NECK: normal movements of the head and neck  LUNGS: on inspection no signs of respiratory distress, breathing rate appears normal, no obvious gross SOB, gasping or wheezing  CV: no obvious cyanosis  MS: moves all visible extremities without noticeable abnormality  PSYCH/NEURO: pleasant and cooperative, no obvious depression or anxiety, speech and thought processing  grossly intact  ASSESSMENT AND PLAN:  Discussed the following assessment and plan:  1. Mood disorder (HCC) -We will increase Lamictal 200 mg.  Advise follow-up in the next 2 weeks if symptoms are not more managed. - lamoTRIgine (LAMICTAL) 100 MG tablet; Take 1 tablet (100 mg total) by mouth daily.  Dispense: 90 tablet; Refill: 0  2. Other insomnia  - zolpidem (AMBIEN) 10 MG tablet; Take 1 tablet (10 mg total) by mouth at bedtime as needed for sleep.  Dispense: 15 tablet; Refill: 1     I discussed the assessment and treatment plan with the patient. The patient was provided an opportunity to ask questions and all were answered. The patient agreed with the plan and demonstrated an understanding of the instructions.   The patient was advised to call back or seek an in-person evaluation if the symptoms worsen or if the condition fails to improve as anticipated.   Dorothyann Peng, NP

## 2020-11-06 DIAGNOSIS — J3089 Other allergic rhinitis: Secondary | ICD-10-CM | POA: Diagnosis not present

## 2020-11-06 DIAGNOSIS — J301 Allergic rhinitis due to pollen: Secondary | ICD-10-CM | POA: Diagnosis not present

## 2020-11-06 DIAGNOSIS — J3081 Allergic rhinitis due to animal (cat) (dog) hair and dander: Secondary | ICD-10-CM | POA: Diagnosis not present

## 2020-11-09 NOTE — Telephone Encounter (Signed)
Pt had a video visit with Tommi Rumps on 11/01/2020

## 2020-11-10 DIAGNOSIS — J3081 Allergic rhinitis due to animal (cat) (dog) hair and dander: Secondary | ICD-10-CM | POA: Diagnosis not present

## 2020-11-10 DIAGNOSIS — J3089 Other allergic rhinitis: Secondary | ICD-10-CM | POA: Diagnosis not present

## 2020-11-10 DIAGNOSIS — J301 Allergic rhinitis due to pollen: Secondary | ICD-10-CM | POA: Diagnosis not present

## 2020-11-12 ENCOUNTER — Other Ambulatory Visit: Payer: Self-pay | Admitting: Internal Medicine

## 2020-11-13 ENCOUNTER — Other Ambulatory Visit (HOSPITAL_COMMUNITY): Payer: Self-pay

## 2020-11-13 DIAGNOSIS — J3081 Allergic rhinitis due to animal (cat) (dog) hair and dander: Secondary | ICD-10-CM | POA: Diagnosis not present

## 2020-11-13 DIAGNOSIS — J301 Allergic rhinitis due to pollen: Secondary | ICD-10-CM | POA: Diagnosis not present

## 2020-11-13 DIAGNOSIS — J3089 Other allergic rhinitis: Secondary | ICD-10-CM | POA: Diagnosis not present

## 2020-11-14 ENCOUNTER — Other Ambulatory Visit (HOSPITAL_COMMUNITY): Payer: Self-pay

## 2020-11-14 DIAGNOSIS — F902 Attention-deficit hyperactivity disorder, combined type: Secondary | ICD-10-CM | POA: Diagnosis not present

## 2020-11-14 MED ORDER — OMEPRAZOLE 40 MG PO CPDR
DELAYED_RELEASE_CAPSULE | Freq: Every day | ORAL | 1 refills | Status: DC
  Filled 2020-11-14: qty 90, 90d supply, fill #0
  Filled 2021-03-09: qty 90, 90d supply, fill #1

## 2020-11-21 DIAGNOSIS — J3081 Allergic rhinitis due to animal (cat) (dog) hair and dander: Secondary | ICD-10-CM | POA: Diagnosis not present

## 2020-11-21 DIAGNOSIS — J3089 Other allergic rhinitis: Secondary | ICD-10-CM | POA: Diagnosis not present

## 2020-11-21 DIAGNOSIS — J301 Allergic rhinitis due to pollen: Secondary | ICD-10-CM | POA: Diagnosis not present

## 2020-11-27 DIAGNOSIS — J3089 Other allergic rhinitis: Secondary | ICD-10-CM | POA: Diagnosis not present

## 2020-11-27 DIAGNOSIS — J3081 Allergic rhinitis due to animal (cat) (dog) hair and dander: Secondary | ICD-10-CM | POA: Diagnosis not present

## 2020-11-27 DIAGNOSIS — J301 Allergic rhinitis due to pollen: Secondary | ICD-10-CM | POA: Diagnosis not present

## 2020-11-29 DIAGNOSIS — J301 Allergic rhinitis due to pollen: Secondary | ICD-10-CM | POA: Diagnosis not present

## 2020-11-29 DIAGNOSIS — J3089 Other allergic rhinitis: Secondary | ICD-10-CM | POA: Diagnosis not present

## 2020-11-29 DIAGNOSIS — J3081 Allergic rhinitis due to animal (cat) (dog) hair and dander: Secondary | ICD-10-CM | POA: Diagnosis not present

## 2020-12-07 DIAGNOSIS — F902 Attention-deficit hyperactivity disorder, combined type: Secondary | ICD-10-CM | POA: Diagnosis not present

## 2020-12-13 DIAGNOSIS — J301 Allergic rhinitis due to pollen: Secondary | ICD-10-CM | POA: Diagnosis not present

## 2020-12-13 DIAGNOSIS — J3089 Other allergic rhinitis: Secondary | ICD-10-CM | POA: Diagnosis not present

## 2020-12-13 DIAGNOSIS — J3081 Allergic rhinitis due to animal (cat) (dog) hair and dander: Secondary | ICD-10-CM | POA: Diagnosis not present

## 2020-12-18 DIAGNOSIS — J301 Allergic rhinitis due to pollen: Secondary | ICD-10-CM | POA: Diagnosis not present

## 2020-12-18 DIAGNOSIS — J3089 Other allergic rhinitis: Secondary | ICD-10-CM | POA: Diagnosis not present

## 2020-12-18 DIAGNOSIS — J3081 Allergic rhinitis due to animal (cat) (dog) hair and dander: Secondary | ICD-10-CM | POA: Diagnosis not present

## 2020-12-24 ENCOUNTER — Encounter: Payer: Self-pay | Admitting: Adult Health

## 2020-12-27 DIAGNOSIS — J3089 Other allergic rhinitis: Secondary | ICD-10-CM | POA: Diagnosis not present

## 2020-12-27 DIAGNOSIS — Z23 Encounter for immunization: Secondary | ICD-10-CM | POA: Diagnosis not present

## 2020-12-27 DIAGNOSIS — J301 Allergic rhinitis due to pollen: Secondary | ICD-10-CM | POA: Diagnosis not present

## 2020-12-27 DIAGNOSIS — F902 Attention-deficit hyperactivity disorder, combined type: Secondary | ICD-10-CM | POA: Diagnosis not present

## 2020-12-27 DIAGNOSIS — J3081 Allergic rhinitis due to animal (cat) (dog) hair and dander: Secondary | ICD-10-CM | POA: Diagnosis not present

## 2020-12-27 NOTE — Telephone Encounter (Signed)
Please advise 

## 2020-12-29 ENCOUNTER — Other Ambulatory Visit (HOSPITAL_COMMUNITY): Payer: Self-pay

## 2021-01-01 DIAGNOSIS — J301 Allergic rhinitis due to pollen: Secondary | ICD-10-CM | POA: Diagnosis not present

## 2021-01-01 DIAGNOSIS — J3081 Allergic rhinitis due to animal (cat) (dog) hair and dander: Secondary | ICD-10-CM | POA: Diagnosis not present

## 2021-01-01 DIAGNOSIS — J3089 Other allergic rhinitis: Secondary | ICD-10-CM | POA: Diagnosis not present

## 2021-01-03 ENCOUNTER — Ambulatory Visit: Payer: 59 | Admitting: Adult Health

## 2021-01-03 ENCOUNTER — Encounter: Payer: Self-pay | Admitting: Adult Health

## 2021-01-03 ENCOUNTER — Other Ambulatory Visit: Payer: Self-pay

## 2021-01-03 VITALS — BP 110/80 | HR 117 | Temp 98.2°F | Ht 67.0 in | Wt 195.0 lb

## 2021-01-03 DIAGNOSIS — Z23 Encounter for immunization: Secondary | ICD-10-CM

## 2021-01-03 DIAGNOSIS — G479 Sleep disorder, unspecified: Secondary | ICD-10-CM

## 2021-01-03 NOTE — Progress Notes (Signed)
Subjective:    Patient ID: Lisa Hines, female    DOB: Apr 29, 1995, 25 y.o.   MRN: [id number]  HPI 25 year old female who  has a past medical history of Acute appendicitis with localized peritonitis (10/28/2014), ADD (attention deficit disorder), Family history of adverse reaction to anesthesia, History of Clostridium difficile colitis, Infectious mononucleosis (01/25/2014), Irregular menses (11/08/2010), Knee injury (04/19/2011), Myopia, Pes planus, and Tonsillar hypertrophy.  She is being evaluated today for sleep disorder possibly sleep apnea. Does have intermittent insomnia for which she takes ambien PRN for.   She has noticed that when she wakes up in the morning she does not feel well rested and has chronic fatigue, she will sometimes take a nap at lunch despite taking Vyvance. She also reports a sore throat when she wakes up and headaches in the morning. Denies apneic episodes. Does now know is she snores   Review of Systems See HPI   Past Medical History:  Diagnosis Date   Acute appendicitis with localized peritonitis 10/28/2014   ADD (attention deficit disorder)    Family history of adverse reaction to anesthesia    unknown---pt. is adopted   History of Clostridium difficile colitis    Infectious mononucleosis 01/25/2014   no obvious complication except she complains of severe pain in her throat not responsive to ibuprofen discussed  options pain medicines steroid risk benefit    Irregular menses 11/08/2010   Knee injury 04/19/2011   Poss traumatic bursitis tendinitis knee looks stable  .    Myopia    Pes planus    consult [facility]   Tonsillar hypertrophy     Social History   Socioeconomic History   Marital status: Single    Spouse name: Not on file   Number of children: Not on file   Years of education: Not on file   Highest education level: Not on file  Occupational History   Not on file  Tobacco Use   Smoking status: Never   Smokeless tobacco: Never  Vaping  Use   Vaping Use: Every day  Substance and Sexual Activity   Alcohol use: Yes    Alcohol/week: 0.0 standard drinks    Comment: ocassionally    Drug use: No   Sexual activity: Not on file  Other Topics Concern   Not on file  Social History Narrative   Caretaker verifies today that the child's current immunizations are up to date.   Child is adopted   Sib adopted   Active at school gymnastics, swimming, tennis and horseback riding seasonal now volleyball   Sleep about 9 hours or so   AL program   [employer]   ib courses   Field hockey diving and lacrosse in past    Intact family      Social Determinants of Health   Financial Resource Strain: Not on file  Food Insecurity: Not on file  Transportation Needs: Not on file  Physical Activity: Not on file  Stress: Not on file  Social Connections: Not on file  Intimate Partner Violence: Not on file    Past Surgical History:  Procedure Laterality Date   APPENDECTOMY     LAPAROSCOPIC APPENDECTOMY N/A 10/28/2014   Procedure: APPENDECTOMY LAPAROSCOPIC;  Surgeon: Excell Seltzer, MD;  Location: [facility];  Service: General;  Laterality: N/A;   MYRINGOTOMY     TONSILLECTOMY Bilateral 03/19/2017   Procedure: TONSILLECTOMY;  Surgeon: Melissa Montane, MD;  Location: [facility];  Service: ENT;  Laterality: Bilateral;  Family History  Adopted: Yes  Problem Relation Age of Onset   ADD / ADHD Brother        possible ld    Allergies  Allergen Reactions   Lamictal [Lamotrigine] Hives   Clindamycin Other (See Comments)    cdiff colitis   Latex     Other reaction(s): Other, Other (See Comments)   Amoxicillin-Pot Clavulanate Rash   Penicillin G Rash    Current Outpatient Medications on File Prior to Visit  Medication Sig Dispense Refill   buPROPion (WELLBUTRIN XL) 150 MG 24 hr tablet TAKE 1 TABLET BY MOUTH EVERY MORNING 90 tablet 1   drospirenone-ethinyl estradiol (JASMIEL) 3-0.02 MG tablet Take 1 tablet by mouth daily. Take  continuously skip placebos. 112 tablet 3   EPINEPHrine 0.3 mg/0.3 mL IJ SOAJ injection USE AS DIRECTED AS NEEDED 2 each 1   lamoTRIgine (LAMICTAL) 100 MG tablet Take 1 tablet (100 mg total) by mouth daily. 90 tablet 0   lisdexamfetamine (VYVANSE) 40 MG capsule Take 1 capsule by mouth every morning. 90 capsule 0   omeprazole (PRILOSEC) 40 MG capsule Take 1 capsule by mouth daily 90 capsule 1   zolpidem (AMBIEN) 10 MG tablet Take 1 tablet (10 mg total) by mouth at bedtime as needed for sleep. 15 tablet 1   lisdexamfetamine (VYVANSE) 10 MG capsule      No current facility-administered medications on file prior to visit.    BP 110/80   Pulse (!) 117   Temp 98.2 F (36.8 C) (Oral)   Ht 5\' 7"  (1.702 m)   Wt 195 lb (88.5 kg)   SpO2 96%   BMI 30.54 kg/m       Objective:   Physical Exam Vitals and nursing note reviewed.  Constitutional:      Appearance: Normal appearance. She is obese.  Cardiovascular:     Rate and Rhythm: Normal rate and regular rhythm.     Pulses: Normal pulses.     Heart sounds: Normal heart sounds.  Pulmonary:     Effort: Pulmonary effort is normal.     Breath sounds: Normal breath sounds.  Musculoskeletal:        General: Normal range of motion.  Skin:    General: Skin is warm and dry.     Capillary Refill: Capillary refill takes less than 2 seconds.  Neurological:     General: No focal deficit present.     Mental Status: She is alert and oriented to person, place, and time.  Psychiatric:        Mood and Affect: Mood normal.        Behavior: Behavior normal.        Thought Content: Thought content normal.        Judgment: Judgment normal.      Assessment & Plan:  1. Sleep disorder - Concern for sleep apnea  - Ambulatory referral to Pulmonology  2. Need for Tdap vaccination  - Tdap vaccine greater than or equal to 7yo IM  Dorothyann Peng, NP

## 2021-01-08 ENCOUNTER — Other Ambulatory Visit (HOSPITAL_COMMUNITY): Payer: Self-pay

## 2021-01-08 DIAGNOSIS — J3081 Allergic rhinitis due to animal (cat) (dog) hair and dander: Secondary | ICD-10-CM | POA: Diagnosis not present

## 2021-01-08 DIAGNOSIS — J301 Allergic rhinitis due to pollen: Secondary | ICD-10-CM | POA: Diagnosis not present

## 2021-01-08 DIAGNOSIS — J3089 Other allergic rhinitis: Secondary | ICD-10-CM | POA: Diagnosis not present

## 2021-01-16 DIAGNOSIS — J3089 Other allergic rhinitis: Secondary | ICD-10-CM | POA: Diagnosis not present

## 2021-01-16 DIAGNOSIS — J3081 Allergic rhinitis due to animal (cat) (dog) hair and dander: Secondary | ICD-10-CM | POA: Diagnosis not present

## 2021-01-16 DIAGNOSIS — J301 Allergic rhinitis due to pollen: Secondary | ICD-10-CM | POA: Diagnosis not present

## 2021-01-23 ENCOUNTER — Telehealth: Payer: 59 | Admitting: Adult Health

## 2021-01-23 ENCOUNTER — Encounter: Payer: Self-pay | Admitting: Adult Health

## 2021-01-23 ENCOUNTER — Other Ambulatory Visit (HOSPITAL_COMMUNITY): Payer: Self-pay

## 2021-01-23 VITALS — Ht 67.0 in | Wt 195.0 lb

## 2021-01-23 DIAGNOSIS — J988 Other specified respiratory disorders: Secondary | ICD-10-CM

## 2021-01-23 MED ORDER — AZITHROMYCIN 250 MG PO TABS
ORAL_TABLET | ORAL | 0 refills | Status: AC
  Filled 2021-01-23: qty 6, 5d supply, fill #0

## 2021-01-23 MED ORDER — PREDNISONE 10 MG PO TABS
10.0000 mg | ORAL_TABLET | Freq: Every day | ORAL | 0 refills | Status: DC
  Filled 2021-01-23: qty 7, 7d supply, fill #0

## 2021-01-23 NOTE — Progress Notes (Signed)
Virtual Visit via Video Note  I connected with Drue Dun on 01/23/21 at 11:00 AM EST by a video enabled telemedicine application and verified that I am speaking with the correct person using two identifiers.  Location patient: home Location provider:work or home office Persons participating in the virtual visit: patient, provider  I discussed the limitations of evaluation and management by telemedicine and the availability of in person appointments. The patient expressed understanding and agreed to proceed.   HPI: 25 year old female who is being evaluated today for an acute issue.  Symptoms have been present for roughly 5 days.  Symptoms started with sore throat and nausea ( resolved), the next day she started to develop similar productive cough, laryngitis, ear pressure bilaterally, and had a possible low-grade fever.  The week has gone on she is also developed fatigue.  Yesterday she developed shortness of breath and wheezing.  She denies chills, joint pain, body aches, nausea, vomiting, diarrhea  She has taken 3 at home COVID tests, all of which were negative.  She has had her flu vaccination   ROS: See pertinent positives and negatives per HPI.  Past Medical History:  Diagnosis Date   Acute appendicitis with localized peritonitis 10/28/2014   ADD (attention deficit disorder)    Family history of adverse reaction to anesthesia    unknown---pt. is adopted   History of Clostridium difficile colitis    Infectious mononucleosis 01/25/2014   no obvious complication except she complains of severe pain in her throat not responsive to ibuprofen discussed  options pain medicines steroid risk benefit    Irregular menses 11/08/2010   Knee injury 04/19/2011   Poss traumatic bursitis tendinitis knee looks stable  .    Myopia    Pes planus    consult [facility]   Tonsillar hypertrophy     Past Surgical History:  Procedure Laterality Date   APPENDECTOMY     LAPAROSCOPIC APPENDECTOMY N/A  10/28/2014   Procedure: APPENDECTOMY LAPAROSCOPIC;  Surgeon: Excell Seltzer, MD;  Location: [facility];  Service: General;  Laterality: N/A;   MYRINGOTOMY     TONSILLECTOMY Bilateral 03/19/2017   Procedure: TONSILLECTOMY;  Surgeon: Melissa Montane, MD;  Location: [facility];  Service: ENT;  Laterality: Bilateral;    Family History  Adopted: Yes  Problem Relation Age of Onset   ADD / ADHD Brother        possible ld       Current Outpatient Medications:    buPROPion (WELLBUTRIN XL) 150 MG 24 hr tablet, TAKE 1 TABLET BY MOUTH EVERY MORNING, Disp: 90 tablet, Rfl: 1   drospirenone-ethinyl estradiol (JASMIEL) 3-0.02 MG tablet, Take 1 tablet by mouth daily. Take continuously skip placebos., Disp: 112 tablet, Rfl: 3   EPINEPHrine 0.3 mg/0.3 mL IJ SOAJ injection, USE AS DIRECTED AS NEEDED, Disp: 2 each, Rfl: 1   lamoTRIgine (LAMICTAL) 100 MG tablet, Take 1 tablet (100 mg total) by mouth daily. (Patient taking differently: Take 200 mg by mouth daily.), Disp: 90 tablet, Rfl: 0   lisdexamfetamine (VYVANSE) 10 MG capsule, , Disp: , Rfl:    omeprazole (PRILOSEC) 40 MG capsule, Take 1 capsule by mouth daily, Disp: 90 capsule, Rfl: 1   zolpidem (AMBIEN) 10 MG tablet, Take 1 tablet (10 mg total) by mouth at bedtime as needed for sleep., Disp: 15 tablet, Rfl: 1  EXAM:  VITALS per patient if applicable:  GENERAL: alert, oriented, appears well and in no acute distress  HEENT: atraumatic, conjunttiva clear, no obvious abnormalities  on inspection of external nose and ears  NECK: normal movements of the head and neck  LUNGS: on inspection no signs of respiratory distress, breathing rate appears normal, no obvious gross SOB, gasping or wheezing  CV: no obvious cyanosis  MS: moves all visible extremities without noticeable abnormality  PSYCH/NEURO: pleasant and cooperative, no obvious depression or anxiety, speech and thought processing grossly intact  ASSESSMENT AND PLAN:  Discussed the  following assessment and plan:  1. Respiratory infection -We will treat with prednisone due to shortness of breath and wheezing.  Advised to start azithromycin in 2 to 3 days if symptoms are not improving. Follow up as needed - predniSONE (DELTASONE) 10 MG tablet; Take 1 tablet (10 mg total) by mouth daily with breakfast.  Dispense: 7 tablet; Refill: 0 - azithromycin (ZITHROMAX) 250 MG tablet; Take 2 tablets on day 1, then 1 tablet daily on days 2 through 5  Dispense: 6 tablet; Refill: 0      I discussed the assessment and treatment plan with the patient. The patient was provided an opportunity to ask questions and all were answered. The patient agreed with the plan and demonstrated an understanding of the instructions.   The patient was advised to call back or seek an in-person evaluation if the symptoms worsen or if the condition fails to improve as anticipated.   Lisa Peng, NP

## 2021-01-30 DIAGNOSIS — J3089 Other allergic rhinitis: Secondary | ICD-10-CM | POA: Diagnosis not present

## 2021-01-30 DIAGNOSIS — J301 Allergic rhinitis due to pollen: Secondary | ICD-10-CM | POA: Diagnosis not present

## 2021-01-30 DIAGNOSIS — J3081 Allergic rhinitis due to animal (cat) (dog) hair and dander: Secondary | ICD-10-CM | POA: Diagnosis not present

## 2021-02-06 DIAGNOSIS — F902 Attention-deficit hyperactivity disorder, combined type: Secondary | ICD-10-CM | POA: Diagnosis not present

## 2021-02-07 ENCOUNTER — Other Ambulatory Visit: Payer: Self-pay | Admitting: Adult Health

## 2021-02-07 DIAGNOSIS — G4709 Other insomnia: Secondary | ICD-10-CM

## 2021-02-07 DIAGNOSIS — J3081 Allergic rhinitis due to animal (cat) (dog) hair and dander: Secondary | ICD-10-CM | POA: Diagnosis not present

## 2021-02-07 DIAGNOSIS — J3089 Other allergic rhinitis: Secondary | ICD-10-CM | POA: Diagnosis not present

## 2021-02-07 DIAGNOSIS — J301 Allergic rhinitis due to pollen: Secondary | ICD-10-CM | POA: Diagnosis not present

## 2021-02-12 ENCOUNTER — Other Ambulatory Visit: Payer: Self-pay | Admitting: Adult Health

## 2021-02-12 ENCOUNTER — Other Ambulatory Visit (HOSPITAL_COMMUNITY): Payer: Self-pay

## 2021-02-12 DIAGNOSIS — J3089 Other allergic rhinitis: Secondary | ICD-10-CM | POA: Diagnosis not present

## 2021-02-12 DIAGNOSIS — G4709 Other insomnia: Secondary | ICD-10-CM

## 2021-02-12 DIAGNOSIS — J3081 Allergic rhinitis due to animal (cat) (dog) hair and dander: Secondary | ICD-10-CM | POA: Diagnosis not present

## 2021-02-12 DIAGNOSIS — J301 Allergic rhinitis due to pollen: Secondary | ICD-10-CM | POA: Diagnosis not present

## 2021-02-13 ENCOUNTER — Other Ambulatory Visit: Payer: Self-pay | Admitting: Adult Health

## 2021-02-13 ENCOUNTER — Other Ambulatory Visit (HOSPITAL_COMMUNITY): Payer: Self-pay

## 2021-02-13 MED ORDER — LISDEXAMFETAMINE DIMESYLATE 10 MG PO CAPS
10.0000 mg | ORAL_CAPSULE | Freq: Every day | ORAL | 0 refills | Status: DC
  Filled 2021-02-13: qty 90, 90d supply, fill #0

## 2021-02-13 MED ORDER — ZOLPIDEM TARTRATE 10 MG PO TABS
10.0000 mg | ORAL_TABLET | Freq: Every evening | ORAL | 1 refills | Status: DC | PRN
  Filled 2021-02-13: qty 15, 15d supply, fill #0
  Filled 2021-04-11: qty 15, 15d supply, fill #1

## 2021-02-13 NOTE — Telephone Encounter (Signed)
Okay for refill?  

## 2021-02-14 ENCOUNTER — Other Ambulatory Visit: Payer: Self-pay | Admitting: Adult Health

## 2021-02-14 ENCOUNTER — Other Ambulatory Visit (HOSPITAL_COMMUNITY): Payer: Self-pay

## 2021-02-14 ENCOUNTER — Encounter: Payer: Self-pay | Admitting: Adult Health

## 2021-02-14 MED ORDER — LISDEXAMFETAMINE DIMESYLATE 40 MG PO CAPS
40.0000 mg | ORAL_CAPSULE | ORAL | 0 refills | Status: DC
  Filled 2021-02-14: qty 90, 90d supply, fill #0

## 2021-02-14 NOTE — Telephone Encounter (Signed)
This has been taking care of. Spoke with the pharmacist and advised per [facility] that the 10 Mg was sent in by accident.

## 2021-02-20 DIAGNOSIS — F902 Attention-deficit hyperactivity disorder, combined type: Secondary | ICD-10-CM | POA: Diagnosis not present

## 2021-02-22 DIAGNOSIS — J301 Allergic rhinitis due to pollen: Secondary | ICD-10-CM | POA: Diagnosis not present

## 2021-02-22 DIAGNOSIS — J3081 Allergic rhinitis due to animal (cat) (dog) hair and dander: Secondary | ICD-10-CM | POA: Diagnosis not present

## 2021-02-22 DIAGNOSIS — J3089 Other allergic rhinitis: Secondary | ICD-10-CM | POA: Diagnosis not present

## 2021-02-26 DIAGNOSIS — J3089 Other allergic rhinitis: Secondary | ICD-10-CM | POA: Diagnosis not present

## 2021-02-26 DIAGNOSIS — J301 Allergic rhinitis due to pollen: Secondary | ICD-10-CM | POA: Diagnosis not present

## 2021-02-26 DIAGNOSIS — J3081 Allergic rhinitis due to animal (cat) (dog) hair and dander: Secondary | ICD-10-CM | POA: Diagnosis not present

## 2021-03-07 DIAGNOSIS — J301 Allergic rhinitis due to pollen: Secondary | ICD-10-CM | POA: Diagnosis not present

## 2021-03-07 DIAGNOSIS — J3081 Allergic rhinitis due to animal (cat) (dog) hair and dander: Secondary | ICD-10-CM | POA: Diagnosis not present

## 2021-03-07 DIAGNOSIS — J3089 Other allergic rhinitis: Secondary | ICD-10-CM | POA: Diagnosis not present

## 2021-03-09 ENCOUNTER — Other Ambulatory Visit: Payer: Self-pay | Admitting: Adult Health

## 2021-03-09 DIAGNOSIS — F39 Unspecified mood [affective] disorder: Secondary | ICD-10-CM

## 2021-03-11 ENCOUNTER — Other Ambulatory Visit (HOSPITAL_COMMUNITY): Payer: Self-pay

## 2021-03-13 ENCOUNTER — Other Ambulatory Visit (HOSPITAL_COMMUNITY): Payer: Self-pay

## 2021-03-13 MED ORDER — BUPROPION HCL ER (XL) 150 MG PO TB24
ORAL_TABLET | Freq: Every morning | ORAL | 1 refills | Status: DC
  Filled 2021-03-13: qty 90, 90d supply, fill #0

## 2021-03-13 MED ORDER — LAMOTRIGINE 100 MG PO TABS
100.0000 mg | ORAL_TABLET | Freq: Every day | ORAL | 1 refills | Status: DC
  Filled 2021-03-13: qty 90, 90d supply, fill #0
  Filled 2021-07-06: qty 90, 90d supply, fill #1

## 2021-03-20 ENCOUNTER — Institutional Professional Consult (permissible substitution): Payer: 59 | Admitting: Internal Medicine

## 2021-03-20 DIAGNOSIS — F902 Attention-deficit hyperactivity disorder, combined type: Secondary | ICD-10-CM | POA: Diagnosis not present

## 2021-03-21 DIAGNOSIS — J301 Allergic rhinitis due to pollen: Secondary | ICD-10-CM | POA: Diagnosis not present

## 2021-03-21 DIAGNOSIS — J3089 Other allergic rhinitis: Secondary | ICD-10-CM | POA: Diagnosis not present

## 2021-03-21 DIAGNOSIS — J3081 Allergic rhinitis due to animal (cat) (dog) hair and dander: Secondary | ICD-10-CM | POA: Diagnosis not present

## 2021-03-21 NOTE — Progress Notes (Signed)
03/22/2021- 25 yoF Vape smoker for sleep evaluation courtesy of Dorothyann Peng, NP with concern of OSA Father is Dr [name]. She lives with parents. Adopted. Birth mother died of cerebral aneurysm. 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 Wakes up 2-3 times a night. States that she has been waking up with sore throat.  She suspects snoring because of morning sore throats.  ENT surgery for tonsils and wisdom teeth.  She is not aware of lung, heart or thyroid disorder but keeps rapid heart rate which she blames on vaping.  Has used melatonin plus Ambien 10 mg intermittently for insomnia.  Little caffeine.  Not aware of any blood relative history of sleep apnea.  Denies pregnancy. Denies complex parasomnias.  Prior to Admission medications   Medication Sig Start Date End Date Taking? Authorizing Provider  buPROPion (WELLBUTRIN XL) 150 MG 24 hr tablet TAKE 1 TABLET BY MOUTH EVERY MORNING 03/13/21 03/13/22 Yes Nafziger, Tommi Rumps, NP  drospirenone-ethinyl estradiol (JASMIEL) 3-0.02 MG tablet Take 1 tablet by mouth daily. Take continuously skip placebos. 10/04/20  Yes Jerelyn Charles, MD  EPINEPHrine 0.3 mg/0.3 mL IJ SOAJ injection USE AS DIRECTED AS NEEDED 05/23/20 05/23/21 Yes Mosetta Anis, MD  lamoTRIgine (LAMICTAL) 100 MG tablet Take 1 tablet by mouth daily. 03/13/21  Yes Nafziger, Tommi Rumps, NP  lisdexamfetamine (VYVANSE) 40 MG capsule Take 1 capsule (40 mg total) by mouth every morning. 02/14/21  Yes Nafziger, Tommi Rumps, NP  omeprazole (PRILOSEC) 40 MG capsule Take 1 capsule by mouth daily 11/14/20 11/14/21 Yes Nafziger, Tommi Rumps, NP  predniSONE  (DELTASONE) 10 MG tablet Take 1 tablet (10 mg total) by mouth daily with breakfast. 01/23/21  Yes Nafziger, Tommi Rumps, NP  zolpidem (AMBIEN) 10 MG tablet Take 1 tablet (10 mg total) by mouth at bedtime as needed for sleep. 02/13/21  Yes Nafziger, Tommi Rumps, NP   Past Medical History:  Diagnosis Date   Acute appendicitis with localized peritonitis 10/28/2014   ADD (attention deficit disorder)    Family history of adverse reaction to anesthesia    unknown---pt. is adopted   History of Clostridium difficile colitis    Infectious mononucleosis 01/25/2014   no obvious complication except she complains of severe pain in her throat not responsive to ibuprofen discussed  options pain medicines steroid risk benefit    Irregular menses 11/08/2010   Knee injury 04/19/2011   Poss traumatic bursitis tendinitis knee looks stable  .    Myopia    Pes planus    consult [facility]   Tonsillar hypertrophy    .psh Family History  Adopted: Yes  Problem Relation Age of Onset   ADD / ADHD Brother        possible ld   Social History   Socioeconomic History   Marital status: Single    Spouse name: Not on file   Number of children: Not on file   Years of education: Not on file   Highest education level: Not on file  Occupational History   Not on file  Tobacco Use   Smoking status: Never   Smokeless tobacco: Never  Vaping Use   Vaping Use: Every day  Substance and Sexual Activity   Alcohol use:  Yes    Alcohol/week: 0.0 standard drinks    Comment: ocassionally    Drug use: No   Sexual activity: Not on file  Other Topics Concern   Not on file  Social History Narrative   Caretaker verifies today that the child's current immunizations are up to date.   Child is adopted   Sib adopted   Active at school gymnastics, swimming, tennis and horseback riding seasonal now volleyball   Sleep about 9 hours or so   AL program   [employer]   ib courses   Field hockey diving and lacrosse in past    Intact family       Social Determinants of Radio broadcast assistant Strain: Not on file  Food Insecurity: Not on file  Transportation Needs: Not on file  Physical Activity: Not on file  Stress: Not on file  Social Connections: Not on file  Intimate Partner Violence: Not on file   ROS-see HPI   + = positive Constitutional:    weight loss, night sweats, fevers, chills, +fatigue, lassitude. HEENT:    headaches, difficulty swallowing, tooth/dental problems, sore throat,       sneezing, itching, ear ache, nasal congestion, post nasal drip, snoring CV:    chest pain, orthopnea, PND, swelling in lower extremities, anasarca,                dizziness, palpitations Resp:   shortness of breath with exertion or at rest.                productive cough,   non-productive cough, coughing up of blood.              change in color of mucus.  wheezing.   Skin:    rash or lesions. GI:  No-   heartburn, indigestion, abdominal pain, nausea, vomiting, diarrhea,                 change in bowel habits, loss of appetite GU: dysuria, change in color of urine, no urgency or frequency.   flank pain. MS:   joint pain, stiffness, decreased range of motion, back pain. Neuro-     nothing unusual Psych:  change in mood or affect.  depression or anxiety.   memory loss.  OBJ- Physical Exam General- Alert, Oriented, Affect-appropriate, Distress- none acute Skin- rash-none, lesions- none, excoriation- none Lymphadenopathy- none Head- atraumatic            Eyes- Gross vision intact, PERRLA, conjunctivae and secretions clear            Ears- Hearing, canals-normal            Nose- Clear, no-Septal dev, mucus, polyps, erosion, perforation             Throat- Mallampati III , mucosa clear , drainage- none, tonsils- atrophic, + teeth Neck- flexible , trachea midline, no stridor , thyroid nl, carotid no bruit Chest - symmetrical excursion , unlabored           Heart/CV- RRR , no murmur , no gallop  , no rub, nl s1 s2                            - JVD- none , edema- none, stasis changes- none, varices- none           Lung- clear to P&A, wheeze- none, cough- none , dullness-none, rub- none  Chest wall-  Abd-  Br/ Gen/ Rectal- Not done, not indicated Extrem- cyanosis- none, clubbing, none, atrophy- none, strength- nl Neuro- grossly intact to observation

## 2021-03-22 ENCOUNTER — Encounter: Payer: Self-pay | Admitting: Adult Health

## 2021-03-22 ENCOUNTER — Encounter: Payer: Self-pay | Admitting: Internal Medicine

## 2021-03-22 ENCOUNTER — Ambulatory Visit (INDEPENDENT_AMBULATORY_CARE_PROVIDER_SITE_OTHER): Payer: 59 | Admitting: Internal Medicine

## 2021-03-22 ENCOUNTER — Other Ambulatory Visit: Payer: Self-pay

## 2021-03-22 VITALS — BP 118/88 | HR 122 | Temp 98.3°F | Ht 67.0 in | Wt 197.0 lb

## 2021-03-22 DIAGNOSIS — F988 Other specified behavioral and emotional disorders with onset usually occurring in childhood and adolescence: Secondary | ICD-10-CM

## 2021-03-22 DIAGNOSIS — G4710 Hypersomnia, unspecified: Secondary | ICD-10-CM | POA: Diagnosis not present

## 2021-03-22 DIAGNOSIS — R0683 Snoring: Secondary | ICD-10-CM | POA: Diagnosis not present

## 2021-03-22 NOTE — Assessment & Plan Note (Signed)
Recognize potential overlap or therapeutic masking of sleep symptoms.

## 2021-03-22 NOTE — Patient Instructions (Signed)
Order- home sleep test scheduled    dx snoring  Please call us about 2 weeks after your sleep test for results and recommendations

## 2021-03-22 NOTE — Assessment & Plan Note (Signed)
Her primary question is whether she has OSA.  Appropriate discussion and questions answered.  We discussed driving responsibility, sleep habits, testing and treatment.  I do not get suspicion of primary hypersomnia from her description at this visit but that can be reconsidered later if appropriate.  The role of mood disturbance is undefined. Plan- sleep study

## 2021-03-23 NOTE — Telephone Encounter (Signed)
Please advise 

## 2021-03-27 DIAGNOSIS — J3081 Allergic rhinitis due to animal (cat) (dog) hair and dander: Secondary | ICD-10-CM | POA: Diagnosis not present

## 2021-03-27 DIAGNOSIS — J3089 Other allergic rhinitis: Secondary | ICD-10-CM | POA: Diagnosis not present

## 2021-03-27 DIAGNOSIS — J301 Allergic rhinitis due to pollen: Secondary | ICD-10-CM | POA: Diagnosis not present

## 2021-04-05 DIAGNOSIS — J3081 Allergic rhinitis due to animal (cat) (dog) hair and dander: Secondary | ICD-10-CM | POA: Diagnosis not present

## 2021-04-05 DIAGNOSIS — J301 Allergic rhinitis due to pollen: Secondary | ICD-10-CM | POA: Diagnosis not present

## 2021-04-05 DIAGNOSIS — J3089 Other allergic rhinitis: Secondary | ICD-10-CM | POA: Diagnosis not present

## 2021-04-06 DIAGNOSIS — F902 Attention-deficit hyperactivity disorder, combined type: Secondary | ICD-10-CM | POA: Diagnosis not present

## 2021-04-09 DIAGNOSIS — J3089 Other allergic rhinitis: Secondary | ICD-10-CM | POA: Diagnosis not present

## 2021-04-09 DIAGNOSIS — J301 Allergic rhinitis due to pollen: Secondary | ICD-10-CM | POA: Diagnosis not present

## 2021-04-10 DIAGNOSIS — D1801 Hemangioma of skin and subcutaneous tissue: Secondary | ICD-10-CM | POA: Diagnosis not present

## 2021-04-10 DIAGNOSIS — L578 Other skin changes due to chronic exposure to nonionizing radiation: Secondary | ICD-10-CM | POA: Diagnosis not present

## 2021-04-10 DIAGNOSIS — L814 Other melanin hyperpigmentation: Secondary | ICD-10-CM | POA: Diagnosis not present

## 2021-04-10 DIAGNOSIS — D229 Melanocytic nevi, unspecified: Secondary | ICD-10-CM | POA: Diagnosis not present

## 2021-04-10 DIAGNOSIS — Z23 Encounter for immunization: Secondary | ICD-10-CM | POA: Diagnosis not present

## 2021-04-12 ENCOUNTER — Other Ambulatory Visit (HOSPITAL_COMMUNITY): Payer: Self-pay

## 2021-04-18 ENCOUNTER — Other Ambulatory Visit (HOSPITAL_COMMUNITY): Payer: Self-pay

## 2021-04-20 DIAGNOSIS — F902 Attention-deficit hyperactivity disorder, combined type: Secondary | ICD-10-CM | POA: Diagnosis not present

## 2021-04-24 ENCOUNTER — Encounter: Payer: Self-pay | Admitting: Adult Health

## 2021-04-24 ENCOUNTER — Ambulatory Visit (INDEPENDENT_AMBULATORY_CARE_PROVIDER_SITE_OTHER): Payer: 59 | Admitting: Adult Health

## 2021-04-24 ENCOUNTER — Other Ambulatory Visit (HOSPITAL_COMMUNITY): Payer: Self-pay

## 2021-04-24 ENCOUNTER — Other Ambulatory Visit (INDEPENDENT_AMBULATORY_CARE_PROVIDER_SITE_OTHER): Payer: 59

## 2021-04-24 ENCOUNTER — Other Ambulatory Visit: Payer: Self-pay | Admitting: Adult Health

## 2021-04-24 VITALS — BP 110/68 | HR 105 | Temp 97.8°F | Ht 67.0 in | Wt 197.0 lb

## 2021-04-24 DIAGNOSIS — F39 Unspecified mood [affective] disorder: Secondary | ICD-10-CM | POA: Diagnosis not present

## 2021-04-24 DIAGNOSIS — F988 Other specified behavioral and emotional disorders with onset usually occurring in childhood and adolescence: Secondary | ICD-10-CM

## 2021-04-24 DIAGNOSIS — K219 Gastro-esophageal reflux disease without esophagitis: Secondary | ICD-10-CM | POA: Diagnosis not present

## 2021-04-24 DIAGNOSIS — R7309 Other abnormal glucose: Secondary | ICD-10-CM

## 2021-04-24 DIAGNOSIS — G4709 Other insomnia: Secondary | ICD-10-CM | POA: Diagnosis not present

## 2021-04-24 DIAGNOSIS — Z0001 Encounter for general adult medical examination with abnormal findings: Secondary | ICD-10-CM

## 2021-04-24 DIAGNOSIS — Z1159 Encounter for screening for other viral diseases: Secondary | ICD-10-CM

## 2021-04-24 LAB — CBC WITH DIFFERENTIAL/PLATELET
Basophils Absolute: 0 10*3/uL (ref 0.0–0.1)
Basophils Relative: 0.6 % (ref 0.0–3.0)
Eosinophils Absolute: 0.3 10*3/uL (ref 0.0–0.7)
Eosinophils Relative: 4 % (ref 0.0–5.0)
HCT: 37.4 % (ref 36.0–46.0)
Hemoglobin: 12.6 g/dL (ref 12.0–15.0)
Lymphocytes Relative: 33.3 % (ref 12.0–46.0)
Lymphs Abs: 2.3 10*3/uL (ref 0.7–4.0)
MCHC: 33.6 g/dL (ref 30.0–36.0)
MCV: 82.8 fl (ref 78.0–100.0)
Monocytes Absolute: 0.5 10*3/uL (ref 0.1–1.0)
Monocytes Relative: 6.5 % (ref 3.0–12.0)
Neutro Abs: 3.9 10*3/uL (ref 1.4–7.7)
Neutrophils Relative %: 55.6 % (ref 43.0–77.0)
Platelets: 373 10*3/uL (ref 150.0–400.0)
RBC: 4.52 Mil/uL (ref 3.87–5.11)
RDW: 14.7 % (ref 11.5–15.5)
WBC: 7 10*3/uL (ref 4.0–10.5)

## 2021-04-24 LAB — TSH: TSH: 2.47 u[IU]/mL (ref 0.35–5.50)

## 2021-04-24 LAB — LIPID PANEL
Cholesterol: 155 mg/dL (ref 0–200)
HDL: 36.6 mg/dL — ABNORMAL LOW (ref >39.00)
NonHDL: 118.19
Total CHOL/HDL Ratio: 4
Triglycerides: 288 mg/dL — ABNORMAL HIGH (ref 0.0–149.0)
VLDL: 57.6 mg/dL — ABNORMAL HIGH (ref 0.0–40.0)

## 2021-04-24 LAB — COMPREHENSIVE METABOLIC PANEL
ALT: 16 U/L (ref 0–35)
AST: 14 U/L (ref 0–37)
Albumin: 3.8 g/dL (ref 3.5–5.2)
Alkaline Phosphatase: 90 U/L (ref 39–117)
BUN: 7 mg/dL (ref 6–23)
CO2: 22 mEq/L (ref 19–32)
Calcium: 9.1 mg/dL (ref 8.4–10.5)
Chloride: 106 mEq/L (ref 96–112)
Creatinine, Ser: 0.68 mg/dL (ref 0.40–1.20)
GFR: 122.35 mL/min (ref >60.00)
Glucose, Bld: 124 mg/dL — ABNORMAL HIGH (ref 70–99)
Potassium: 3.5 mEq/L (ref 3.5–5.1)
Sodium: 135 mEq/L (ref 135–145)
Total Bilirubin: 0.2 mg/dL (ref 0.2–1.2)
Total Protein: 6.8 g/dL (ref 6.0–8.3)

## 2021-04-24 LAB — LDL CHOLESTEROL, DIRECT: Direct LDL: 89 mg/dL

## 2021-04-24 LAB — HEMOGLOBIN A1C: Hgb A1c MFr Bld: 5.4 % (ref 4.6–6.5)

## 2021-04-24 MED ORDER — BUPROPION HCL ER (XL) 300 MG PO TB24
300.0000 mg | ORAL_TABLET | Freq: Every day | ORAL | 1 refills | Status: DC
  Filled 2021-04-24 – 2021-06-04 (×2): qty 90, 90d supply, fill #0
  Filled 2021-09-25: qty 90, 90d supply, fill #1

## 2021-04-24 NOTE — Progress Notes (Signed)
Subjective:    Patient ID: Lisa Hines, female    DOB: 1995-08-30, 26 y.o.   MRN: [id number]  HPI Patient presents for yearly preventative medicine examination. She is a pleasant 26 year old female who  has a past medical history of Acute appendicitis with localized peritonitis (10/28/2014), ADD (attention deficit disorder), Family history of adverse reaction to anesthesia, History of Clostridium difficile colitis, Infectious mononucleosis (01/25/2014), Irregular menses (11/08/2010), Knee injury (04/19/2011), Myopia, Pes planus, and Tonsillar hypertrophy.  ADHD -controlled with Vyvanse 40 mg daily.  GERD-takes omeprazole 20 mg as needed  Adjustment disorder with mixed anxiety and depression-currently prescribed Wellbutrin 150 mg daily as well as Lamictal 100 mg.  She does not feel well controlled currently. Feels as though he depression is getting worse.   Sleep disorder-takes Ambien 10 mg as needed. Takes infrequently.   All immunizations and health maintenance protocols were reviewed with the patient and needed orders were placed.  Appropriate screening laboratory values were ordered for the patient including screening of hyperlipidemia, renal function and hepatic function.  Medication reconciliation,  past medical history, social history, problem list and allergies were reviewed in detail with the patient  Goals were established with regard to weight loss, exercise, and  diet in compliance with medications. She does not exercise at all.  Wt Readings from Last 3 Encounters:  04/24/21 197 lb (89.4 kg)  03/22/21 197 lb (89.4 kg)  01/23/21 195 lb (88.5 kg)   Review of Systems  Constitutional: Negative.   HENT: Negative.    Eyes: Negative.   Respiratory: Negative.    Cardiovascular: Negative.   Gastrointestinal: Negative.   Endocrine: Negative.   Genitourinary: Negative.   Musculoskeletal: Negative.   Skin: Negative.   Allergic/Immunologic: Negative.   Neurological:  Negative.   Hematological: Negative.   Psychiatric/Behavioral:  Positive for dysphoric mood and sleep disturbance.    Past Medical History:  Diagnosis Date   Acute appendicitis with localized peritonitis 10/28/2014   ADD (attention deficit disorder)    Family history of adverse reaction to anesthesia    unknown---pt. is adopted   History of Clostridium difficile colitis    Infectious mononucleosis 01/25/2014   no obvious complication except she complains of severe pain in her throat not responsive to ibuprofen discussed  options pain medicines steroid risk benefit    Irregular menses 11/08/2010   Knee injury 04/19/2011   Poss traumatic bursitis tendinitis knee looks stable  .    Myopia    Pes planus    consult [facility]   Tonsillar hypertrophy     Social History   Socioeconomic History   Marital status: Single    Spouse name: Not on file   Number of children: Not on file   Years of education: Not on file   Highest education level: Not on file  Occupational History   Not on file  Tobacco Use   Smoking status: Never   Smokeless tobacco: Never  Vaping Use   Vaping Use: Every day  Substance and Sexual Activity   Alcohol use: Yes    Alcohol/week: 0.0 standard drinks    Comment: ocassionally    Drug use: No   Sexual activity: Not on file  Other Topics Concern   Not on file  Social History Narrative   Caretaker verifies today that the child's current immunizations are up to date.   Child is adopted   Sib adopted   Active at school gymnastics, swimming, tennis and horseback riding seasonal now volleyball  Sleep about 9 hours or so   AL program   [employer]   ib courses   Field hockey diving and lacrosse in past    Intact family      Social Determinants of Health   Financial Resource Strain: Not on file  Food Insecurity: Not on file  Transportation Needs: Not on file  Physical Activity: Not on file  Stress: Not on file  Social Connections: Not on file  Intimate Partner  Violence: Not on file    Past Surgical History:  Procedure Laterality Date   APPENDECTOMY     LAPAROSCOPIC APPENDECTOMY N/A 10/28/2014   Procedure: APPENDECTOMY LAPAROSCOPIC;  Surgeon: Excell Seltzer, MD;  Location: [facility];  Service: General;  Laterality: N/A;   MYRINGOTOMY     TONSILLECTOMY Bilateral 03/19/2017   Procedure: TONSILLECTOMY;  Surgeon: Melissa Montane, MD;  Location: [place];  Service: ENT;  Laterality: Bilateral;    Family History  Adopted: Yes  Problem Relation Age of Onset   ADD / ADHD Brother        possible ld    Allergies  Allergen Reactions   Lamictal [Lamotrigine] Hives   Clindamycin Other (See Comments)    cdiff colitis   Latex     Other reaction(s): Other, Other (See Comments)   Amoxicillin-Pot Clavulanate Rash   Penicillin G Rash    Current Outpatient Medications on File Prior to Visit  Medication Sig Dispense Refill   Adapalene-Benzoyl Peroxide (EPIDUO) 0.1-2.5 % gel Apply pea size amount to clean dry face     drospirenone-ethinyl estradiol (JASMIEL) 3-0.02 MG tablet Take 1 tablet by mouth daily. Take continuously skip placebos. 112 tablet 3   EPINEPHrine 0.3 mg/0.3 mL IJ SOAJ injection USE AS DIRECTED AS NEEDED 2 each 1   lamoTRIgine (LAMICTAL) 100 MG tablet Take 1 tablet by mouth daily. 90 tablet 1   lisdexamfetamine (VYVANSE) 40 MG capsule Take 1 capsule (40 mg total) by mouth every morning. 90 capsule 0   omeprazole (PRILOSEC) 40 MG capsule Take 1 capsule by mouth daily 90 capsule 1   zolpidem (AMBIEN) 10 MG tablet Take 1 tablet (10 mg total) by mouth at bedtime as needed for sleep. 15 tablet 1   [employer] SUNSCREEN SPF 30 EX apply     levocetirizine (XYZAL ALLERGY 24HR) 5 MG tablet      No current facility-administered medications on file prior to visit.    BP 110/68    Pulse (!) 105    Temp 97.8 F (36.6 C) (Oral)    Ht 5\' 7"  (1.702 m)    Wt 197 lb (89.4 kg)    SpO2 98%    BMI 30.85 kg/m        Objective:   Physical  Exam Vitals and nursing note reviewed.  Constitutional:      General: She is not in acute distress.    Appearance: Normal appearance. She is well-developed. She is obese. She is not ill-appearing.  HENT:     Head: Normocephalic and atraumatic.     Right Ear: Tympanic membrane, ear canal and external ear normal. There is no impacted cerumen.     Left Ear: Tympanic membrane, ear canal and external ear normal. There is no impacted cerumen.     Nose: Nose normal. No congestion or rhinorrhea.     Mouth/Throat:     Mouth: Mucous membranes are moist.     Pharynx: Oropharynx is clear. No oropharyngeal exudate or posterior oropharyngeal erythema.  Eyes:  General:        Right eye: No discharge.        Left eye: No discharge.     Extraocular Movements: Extraocular movements intact.     Conjunctiva/sclera: Conjunctivae normal.     Pupils: Pupils are equal, round, and reactive to light.  Neck:     Thyroid: No thyromegaly.     Vascular: No carotid bruit.     Trachea: No tracheal deviation.  Cardiovascular:     Rate and Rhythm: Normal rate and regular rhythm.     Pulses: Normal pulses.     Heart sounds: Normal heart sounds. No murmur heard.   No friction rub. No gallop.  Pulmonary:     Effort: Pulmonary effort is normal. No respiratory distress.     Breath sounds: Normal breath sounds. No stridor. No wheezing, rhonchi or rales.  Chest:     Chest wall: No tenderness.  Abdominal:     General: Abdomen is flat. Bowel sounds are normal. There is no distension.     Palpations: Abdomen is soft. There is no mass.     Tenderness: There is no abdominal tenderness. There is no right CVA tenderness, left CVA tenderness, guarding or rebound.     Hernia: No hernia is present.  Musculoskeletal:        General: No swelling, tenderness, deformity or signs of injury. Normal range of motion.     Cervical back: Normal range of motion and neck supple.     Right lower leg: No edema.     Left lower leg: No  edema.  Lymphadenopathy:     Cervical: No cervical adenopathy.  Skin:    General: Skin is warm and dry.     Coloration: Skin is not jaundiced or pale.     Findings: No bruising, erythema, lesion or rash.  Neurological:     General: No focal deficit present.     Mental Status: She is alert and oriented to person, place, and time.     Cranial Nerves: No cranial nerve deficit.     Sensory: No sensory deficit.     Motor: No weakness.     Coordination: Coordination normal.     Gait: Gait normal.     Deep Tendon Reflexes: Reflexes normal.  Psychiatric:        Mood and Affect: Mood normal.        Behavior: Behavior normal.        Thought Content: Thought content normal.        Judgment: Judgment normal.      Assessment & Plan:  1. Encounter for general adult medical examination with abnormal findings - encouraged exercise and heart healthy diet to lose weight  - Follow up in one year or sooner if needed - CBC with Differential/Platelet; Future - Comprehensive metabolic panel; Future - Lipid panel; Future - TSH; Future - TSH - Lipid panel - Comprehensive metabolic panel - CBC with Differential/Platelet  2. Mood disorder (HCC) - Will increase Wellbutrin to 300 mg XR - Follow up if not improving in the next month  - CBC with Differential/Platelet; Future - Comprehensive metabolic panel; Future - Lipid panel; Future - TSH; Future - TSH - Lipid panel - Comprehensive metabolic panel - CBC with Differential/Platelet  3. Other insomnia - Continue with Ambien PRN  - CBC with Differential/Platelet; Future - Comprehensive metabolic panel; Future - Lipid panel; Future - TSH; Future - TSH - Lipid panel - Comprehensive metabolic panel - CBC with Differential/Platelet  4. Attention deficit disorder, unspecified hyperactivity presence - Continue with Vyvance  - CBC with Differential/Platelet; Future - Comprehensive metabolic panel; Future - Lipid panel; Future - TSH;  Future - TSH - Lipid panel - Comprehensive metabolic panel - CBC with Differential/Platelet  5. Gastroesophageal reflux disease without esophagitis - Continue with PPI as needed - CBC with Differential/Platelet; Future - Comprehensive metabolic panel; Future - Lipid panel; Future - TSH; Future - TSH - Lipid panel - Comprehensive metabolic panel - CBC with Differential/Platelet  6. Need for hepatitis C screening test  - Hep C Antibody; Future - Hep C Antibody  Dorothyann Peng, NP

## 2021-04-24 NOTE — Patient Instructions (Signed)
It was great seeing you today   We will follow up with you regarding your lab work   Please let me know if you need anything   

## 2021-04-25 ENCOUNTER — Encounter: Payer: Self-pay | Admitting: Adult Health

## 2021-04-25 LAB — HEPATITIS C ANTIBODY
Hepatitis C Ab: NONREACTIVE
SIGNAL TO CUT-OFF: <0.02 (ref <1.00)

## 2021-04-26 NOTE — Telephone Encounter (Signed)
Please advise 

## 2021-05-01 DIAGNOSIS — J301 Allergic rhinitis due to pollen: Secondary | ICD-10-CM | POA: Diagnosis not present

## 2021-05-02 ENCOUNTER — Other Ambulatory Visit (HOSPITAL_COMMUNITY): Payer: Self-pay

## 2021-05-02 DIAGNOSIS — J3089 Other allergic rhinitis: Secondary | ICD-10-CM | POA: Diagnosis not present

## 2021-05-02 DIAGNOSIS — J3081 Allergic rhinitis due to animal (cat) (dog) hair and dander: Secondary | ICD-10-CM | POA: Diagnosis not present

## 2021-05-10 DIAGNOSIS — F902 Attention-deficit hyperactivity disorder, combined type: Secondary | ICD-10-CM | POA: Diagnosis not present

## 2021-05-16 ENCOUNTER — Other Ambulatory Visit: Payer: Self-pay | Admitting: Adult Health

## 2021-05-16 DIAGNOSIS — G4709 Other insomnia: Secondary | ICD-10-CM

## 2021-05-17 ENCOUNTER — Other Ambulatory Visit (HOSPITAL_COMMUNITY): Payer: Self-pay

## 2021-05-17 MED ORDER — ZOLPIDEM TARTRATE 10 MG PO TABS
10.0000 mg | ORAL_TABLET | Freq: Every evening | ORAL | 1 refills | Status: DC | PRN
  Filled 2021-05-17 – 2021-06-04 (×2): qty 15, 15d supply, fill #0
  Filled 2021-07-09: qty 15, 15d supply, fill #1

## 2021-05-17 NOTE — Telephone Encounter (Signed)
Okay for refill?  ? ? ?LOV 04/24/2021 ? ? ?zolpidem (AMBIEN) 10 MG tablet 15 tablet 1 02/13/2021   ?Sig:   Take 1 tablet (10 mg total) by mouth at bedtime as needed for sleep.      ? ?

## 2021-05-18 ENCOUNTER — Other Ambulatory Visit (HOSPITAL_COMMUNITY): Payer: Self-pay

## 2021-05-22 DIAGNOSIS — F902 Attention-deficit hyperactivity disorder, combined type: Secondary | ICD-10-CM | POA: Diagnosis not present

## 2021-05-25 ENCOUNTER — Other Ambulatory Visit (HOSPITAL_COMMUNITY): Payer: Self-pay

## 2021-05-31 ENCOUNTER — Other Ambulatory Visit: Payer: Self-pay

## 2021-05-31 ENCOUNTER — Ambulatory Visit: Payer: 59

## 2021-06-04 ENCOUNTER — Other Ambulatory Visit (HOSPITAL_COMMUNITY): Payer: Self-pay

## 2021-06-07 ENCOUNTER — Ambulatory Visit: Payer: 59

## 2021-06-07 ENCOUNTER — Other Ambulatory Visit: Payer: Self-pay

## 2021-06-07 DIAGNOSIS — F902 Attention-deficit hyperactivity disorder, combined type: Secondary | ICD-10-CM | POA: Diagnosis not present

## 2021-06-11 ENCOUNTER — Other Ambulatory Visit: Payer: Self-pay

## 2021-06-11 ENCOUNTER — Ambulatory Visit: Payer: 59

## 2021-06-11 DIAGNOSIS — R0683 Snoring: Secondary | ICD-10-CM | POA: Diagnosis not present

## 2021-06-13 DIAGNOSIS — R0683 Snoring: Secondary | ICD-10-CM | POA: Diagnosis not present

## 2021-06-15 DIAGNOSIS — F902 Attention-deficit hyperactivity disorder, combined type: Secondary | ICD-10-CM | POA: Diagnosis not present

## 2021-06-20 DIAGNOSIS — F902 Attention-deficit hyperactivity disorder, combined type: Secondary | ICD-10-CM | POA: Diagnosis not present

## 2021-07-01 ENCOUNTER — Encounter: Payer: Self-pay | Admitting: Adult Health

## 2021-07-02 ENCOUNTER — Other Ambulatory Visit: Payer: Self-pay | Admitting: Adult Health

## 2021-07-03 ENCOUNTER — Other Ambulatory Visit (HOSPITAL_COMMUNITY): Payer: Self-pay

## 2021-07-03 MED ORDER — FLUCONAZOLE 150 MG PO TABS
150.0000 mg | ORAL_TABLET | Freq: Once | ORAL | 2 refills | Status: DC
  Filled 2021-07-03: qty 1, 1d supply, fill #0
  Filled 2021-09-04: qty 1, 1d supply, fill #1

## 2021-07-03 MED ORDER — LISDEXAMFETAMINE DIMESYLATE 40 MG PO CAPS
40.0000 mg | ORAL_CAPSULE | ORAL | 0 refills | Status: DC
  Filled 2021-07-03: qty 90, 90d supply, fill #0

## 2021-07-03 MED ORDER — OMEPRAZOLE 40 MG PO CPDR
DELAYED_RELEASE_CAPSULE | Freq: Every day | ORAL | 1 refills | Status: DC
  Filled 2021-07-03: qty 90, 90d supply, fill #0

## 2021-07-03 NOTE — Telephone Encounter (Signed)
Advise Tommi Rumps of message below verbally. Per Tommi Rumps Diflucan sent to pharmacy.  ?

## 2021-07-05 ENCOUNTER — Other Ambulatory Visit (HOSPITAL_COMMUNITY): Payer: Self-pay

## 2021-07-05 ENCOUNTER — Telehealth: Payer: Self-pay | Admitting: Adult Health

## 2021-07-05 MED ORDER — ONDANSETRON HCL 4 MG PO TABS
4.0000 mg | ORAL_TABLET | Freq: Three times a day (TID) | ORAL | 0 refills | Status: DC | PRN
  Filled 2021-07-05: qty 20, 7d supply, fill #0

## 2021-07-05 NOTE — Telephone Encounter (Signed)
Pt requesting refill of a previously prescribed medication for nausea. Medication not showing in med list. Experiencing nausea x 1 day ?

## 2021-07-05 NOTE — Telephone Encounter (Signed)
Ok for nausea medication?  ?

## 2021-07-05 NOTE — Telephone Encounter (Signed)
Rx sent in pt notified ?

## 2021-07-06 ENCOUNTER — Other Ambulatory Visit (HOSPITAL_COMMUNITY): Payer: Self-pay

## 2021-07-09 DIAGNOSIS — F902 Attention-deficit hyperactivity disorder, combined type: Secondary | ICD-10-CM | POA: Diagnosis not present

## 2021-07-10 ENCOUNTER — Other Ambulatory Visit (HOSPITAL_COMMUNITY): Payer: Self-pay

## 2021-07-18 NOTE — Progress Notes (Signed)
03/22/2021- 25 yoF Vape smoker for sleep evaluation courtesy of Lisa Hines, Lisa Hines with concern of OSA Father is Lisa Hines. She lives with parents. Adopted. Birth mother died of cerebral aneurysm. 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 Wakes up 2-3 times a night. States that she has been waking up with sore throat.  She suspects snoring because of morning sore throats.  ENT surgery for tonsils and wisdom teeth.  She is not aware of lung, heart or thyroid disorder but keeps rapid heart rate which she blames on vaping.  Has used melatonin plus Ambien 10 mg intermittently for insomnia.  Little caffeine.  Not aware of any blood relative history of sleep apnea.  Denies pregnancy. Denies complex parasomnias.  07/20/21- 26 yoF Vape Smoker followed for sleep disorder/ excessive somnolence/ Insomnia. Father is Lisa Hines. She lives with parents. Adopted. Birth mother died of cerebral aneurysm. Working as Radio broadcast assistant. Medical problem list includes  ADD,  Hx of Infectious Mono, C.dfii, appendicitis, tonsillitis HST 06/11/21- AHI 0.4/ hr, desaturation to 81%, body weight 197 lbs -Ambien 10, lamictal, wellbutrin, Vyvanse, Covid vax-3 Moderna Flu vax-had Sleep study confirmed with no obstructive sleep apnea.  We reviewed sleep habits and expectations, role of sleep medication (Ambien), naps, and spectrum of available stimulants such as caffeine.  She is on Vyvanse.  Again I do not get a history of cataplexy or significant sleep paralysis.  She seems to be functioning well for driving and work. We discussed simple approaches to snoring,  emphasizing sleep off flat of back, normal body weight, possibility of mouthpiece or chinstrap and availability of ENT evaluation.  ROS-see HPI   + = positive Constitutional:    weight loss, night sweats, fevers, chills, +fatigue, lassitude. HEENT:    headaches, difficulty swallowing, tooth/dental problems, sore throat,       sneezing, itching, ear ache, nasal congestion, post nasal drip, snoring CV:    chest pain, orthopnea, PND, swelling in lower extremities, anasarca, dizziness, palpitations Resp:   shortness of breath with exertion or at rest.                productive cough,   non-productive cough, coughing up of blood.              change in color of mucus.  wheezing.   Skin:    rash or lesions. GI:  No-   heartburn, indigestion, abdominal pain, nausea, vomiting, diarrhea,                 change in bowel habits, loss of appetite GU: dysuria, change in color of urine, no urgency or frequency.   flank pain. MS:   joint pain, stiffness, decreased range of motion, back pain. Neuro-     nothing unusual Psych:  change in mood or affect.  depression or anxiety.   memory loss.  OBJ- Physical Exam General- Alert, Oriented, Affect-appropriate, Distress- none acute Skin- rash-none, lesions- none, excoriation- none Lymphadenopathy- none Head- atraumatic            Eyes- Gross vision intact, PERRLA, conjunctivae and secretions clear            Ears- Hearing, canals-normal  Nose- Clear, no-Septal dev, mucus, polyps, erosion, perforation             Throat- Mallampati III , mucosa clear , drainage- none, tonsils- atrophic, + teeth Neck- flexible , trachea midline, no stridor , thyroid nl, carotid no bruit Chest - symmetrical excursion , unlabored           Heart/CV- RRR , no murmur , no gallop  , no rub, nl s1 s2                           - JVD- none , edema- none, stasis changes- none, varices- none           Lung- clear to P&A, wheeze- none, cough- none , dullness-none, rub- none            Chest wall-  Abd-  Br/ Gen/ Rectal- Not done, not indicated Extrem- cyanosis- none, clubbing, none, atrophy- none, strength- nl Neuro- grossly intact to observation

## 2021-07-20 ENCOUNTER — Ambulatory Visit: Payer: 59 | Admitting: Internal Medicine

## 2021-07-20 ENCOUNTER — Encounter: Payer: Self-pay | Admitting: Internal Medicine

## 2021-07-20 DIAGNOSIS — G4710 Hypersomnia, unspecified: Secondary | ICD-10-CM | POA: Diagnosis not present

## 2021-07-20 NOTE — Patient Instructions (Signed)
Emphasize good sleep habits with comfortable sleep environment and enough sleep at night ? ?Ok to take short naps when needed- maybe 20 minutes after lunch ? ?Consider trying an otc caffeine tablet- NoDoz, Vivarin, etc. ? ?Consider trying an alternative stimulant to Vyvanse- perhaps armodafinil (Nuvigil) ? ?Please  call if we can help ?

## 2021-07-24 DIAGNOSIS — F902 Attention-deficit hyperactivity disorder, combined type: Secondary | ICD-10-CM | POA: Diagnosis not present

## 2021-08-06 DIAGNOSIS — F902 Attention-deficit hyperactivity disorder, combined type: Secondary | ICD-10-CM | POA: Diagnosis not present

## 2021-08-15 ENCOUNTER — Encounter: Payer: Self-pay | Admitting: Adult Health

## 2021-08-16 ENCOUNTER — Other Ambulatory Visit: Payer: Self-pay | Admitting: Adult Health

## 2021-08-16 DIAGNOSIS — F902 Attention-deficit hyperactivity disorder, combined type: Secondary | ICD-10-CM | POA: Diagnosis not present

## 2021-08-16 DIAGNOSIS — G4709 Other insomnia: Secondary | ICD-10-CM

## 2021-08-17 ENCOUNTER — Other Ambulatory Visit (HOSPITAL_COMMUNITY): Payer: Self-pay

## 2021-08-17 MED ORDER — ZOLPIDEM TARTRATE 10 MG PO TABS
10.0000 mg | ORAL_TABLET | Freq: Every evening | ORAL | 2 refills | Status: DC | PRN
  Filled 2021-08-17: qty 15, 15d supply, fill #0
  Filled 2021-10-09: qty 15, 15d supply, fill #1
  Filled 2022-01-04: qty 15, 15d supply, fill #2

## 2021-08-17 NOTE — Telephone Encounter (Signed)
Okay for refill?  

## 2021-08-23 ENCOUNTER — Encounter: Payer: Self-pay | Admitting: Adult Health

## 2021-08-23 ENCOUNTER — Ambulatory Visit: Payer: 59 | Admitting: Adult Health

## 2021-08-23 ENCOUNTER — Other Ambulatory Visit (HOSPITAL_COMMUNITY): Payer: Self-pay

## 2021-08-23 VITALS — BP 116/76 | HR 104 | Temp 98.5°F | Wt 193.6 lb

## 2021-08-23 DIAGNOSIS — E669 Obesity, unspecified: Secondary | ICD-10-CM | POA: Diagnosis not present

## 2021-08-23 DIAGNOSIS — K219 Gastro-esophageal reflux disease without esophagitis: Secondary | ICD-10-CM | POA: Diagnosis not present

## 2021-08-23 MED ORDER — SEMAGLUTIDE-WEIGHT MANAGEMENT 0.25 MG/0.5ML ~~LOC~~ SOAJ
0.2500 mg | SUBCUTANEOUS | 0 refills | Status: DC
  Filled 2021-08-23: qty 2, 28d supply, fill #0

## 2021-08-23 NOTE — Progress Notes (Signed)
Subjective:    Patient ID: Lisa Hines, female    DOB: 29-Dec-1995, 26 y.o.   MRN: [id number]  HPI 26 year old female who  has a past medical history of Acute appendicitis with localized peritonitis (10/28/2014), ADD (attention deficit disorder), Family history of adverse reaction to anesthesia, History of Clostridium difficile colitis, Infectious mononucleosis (01/25/2014), Irregular menses (11/08/2010), Knee injury (04/19/2011), Myopia, Pes planus, and Tonsillar hypertrophy.  She is being evaluated today for weight loss management. She is interested in using wegovy or similar medication to help her lose weight.   She is working on weight loss through diet and exercise but has been unable to lose weight. Her weight has been trending up over the last 4 years.  She has been working out at a [employer] and is eating healthy.   Wt Readings from Last 3 Encounters:  08/23/21 193 lb 9.6 oz (87.8 kg)  07/20/21 184 lb 9.6 oz (83.7 kg)  04/24/21 197 lb (89.4 kg)    She does have a history of GERD and takes Prilosec 40 mg. Feels as though her symptoms are controlled.   Review of Systems See HPI   Past Medical History:  Diagnosis Date   Acute appendicitis with localized peritonitis 10/28/2014   ADD (attention deficit disorder)    Family history of adverse reaction to anesthesia    unknown---pt. is adopted   History of Clostridium difficile colitis    Infectious mononucleosis 01/25/2014   no obvious complication except she complains of severe pain in her throat not responsive to ibuprofen discussed  options pain medicines steroid risk benefit    Irregular menses 11/08/2010   Knee injury 04/19/2011   Poss traumatic bursitis tendinitis knee looks stable  .    Myopia    Pes planus    consult [facility]   Tonsillar hypertrophy     Social History   Socioeconomic History   Marital status: Single    Spouse name: Not on file   Number of children: Not on file   Years of education: Not on file    Highest education level: Bachelor's degree (e.g., BA, AB, BS)  Occupational History   Not on file  Tobacco Use   Smoking status: Never   Smokeless tobacco: Never  Vaping Use   Vaping Use: Every day  Substance and Sexual Activity   Alcohol use: Yes    Alcohol/week: 0.0 standard drinks of alcohol    Comment: ocassionally    Drug use: No   Sexual activity: Not on file  Other Topics Concern   Not on file  Social History Narrative   Caretaker verifies today that the child's current immunizations are up to date.   Child is adopted   Sib adopted   Active at school gymnastics, swimming, tennis and horseback riding seasonal now volleyball   Sleep about 9 hours or so   AL program   [employer]   ib courses   Field hockey diving and lacrosse in past    Intact family      Social Determinants of Health   Financial Resource Strain: Low Risk  (08/22/2021)   Overall Financial Resource Strain (CARDIA)    Difficulty of Paying Living Expenses: Not very hard  Food Insecurity: No Food Insecurity (08/22/2021)   Hunger Vital Sign    Worried About Running Out of Food in the Last Year: Never true    Ran Out of Food in the Last Year: Never true  Transportation Needs: No Transportation  Needs (08/22/2021)   PRAPARE - Hydrologist (Medical): No    Lack of Transportation (Non-Medical): No  Physical Activity: Insufficiently Active (08/22/2021)   Exercise Vital Sign    Days of Exercise per Week: 3 days    Minutes of Exercise per Session: 30 min  Stress: Stress Concern Present (08/22/2021)   [place]    Feeling of Stress : To some extent  Social Connections: Socially Isolated (08/22/2021)   Social Connection and Isolation Panel [NHANES]    Frequency of Communication with Friends and Family: More than three times a week    Frequency of Social Gatherings with Friends and Family: More than three times a week    Attends  Religious Services: Never    Marine scientist or Organizations: No    Attends Music therapist: Not on file    Marital Status: Never married  Intimate Partner Violence: Not on file    Past Surgical History:  Procedure Laterality Date   APPENDECTOMY     LAPAROSCOPIC APPENDECTOMY N/A 10/28/2014   Procedure: APPENDECTOMY LAPAROSCOPIC;  Surgeon: Excell Seltzer, MD;  Location: [facility];  Service: General;  Laterality: N/A;   MYRINGOTOMY     TONSILLECTOMY Bilateral 03/19/2017   Procedure: TONSILLECTOMY;  Surgeon: Melissa Montane, MD;  Location: [place];  Service: ENT;  Laterality: Bilateral;    Family History  Adopted: Yes  Problem Relation Age of Onset   ADD / ADHD Brother        possible ld    Allergies  Allergen Reactions   Lamictal [Lamotrigine] Hives   Clindamycin Other (See Comments)    cdiff colitis   Latex     Other reaction(s): Other, Other (See Comments)   Amoxicillin-Pot Clavulanate Rash   Penicillin G Rash    Current Outpatient Medications on File Prior to Visit  Medication Sig Dispense Refill   buPROPion (WELLBUTRIN XL) 300 MG 24 hr tablet Take 1 tablet by mouth daily. 90 tablet 1   drospirenone-ethinyl estradiol (JASMIEL) 3-0.02 MG tablet Take 1 tablet by mouth daily. Take continuously skip placebos. 112 tablet 3   lamoTRIgine (LAMICTAL) 100 MG tablet Take 1 tablet by mouth daily. 90 tablet 1   lisdexamfetamine (VYVANSE) 40 MG capsule Take 1 capsule by mouth every morning. 90 capsule 0   omeprazole (PRILOSEC) 40 MG capsule Take 1 capsule by mouth daily 90 capsule 1   ondansetron (ZOFRAN) 4 MG tablet Take 1 tablet (4 mg total) by mouth every 8 (eight) hours as needed for nausea or vomiting. 20 tablet 0   zolpidem (AMBIEN) 10 MG tablet Take 1 tablet by mouth at bedtime as needed for sleep. 15 tablet 2   No current facility-administered medications on file prior to visit.    BP 116/76 (BP Location: Right Arm, Patient Position:  Sitting, Cuff Size: Normal)   Pulse (!) 104   Temp 98.5 F (36.9 C) (Oral)   Wt 193 lb 9.6 oz (87.8 kg)   SpO2 98%   BMI 30.32 kg/m       Objective:   Physical Exam Vitals and nursing note reviewed.  Constitutional:      Appearance: Normal appearance.  Cardiovascular:     Rate and Rhythm: Normal rate and regular rhythm.     Pulses: Normal pulses.     Heart sounds: Normal heart sounds.  Pulmonary:     Effort: Pulmonary effort is normal.  Breath sounds: Normal breath sounds.  Skin:    General: Skin is warm and dry.  Neurological:     General: No focal deficit present.     Mental Status: She is alert and oriented to person, place, and time.  Psychiatric:        Mood and Affect: Mood normal.        Behavior: Behavior normal.        Thought Content: Thought content normal.        Judgment: Judgment normal.       Assessment & Plan:  1. Class 1 obesity -Discussed options for weight loss management.  He is exercising and eating healthier but is struggling with weight loss.  We will trial her on Wegovy, started at 0.25 mg weekly.  Follow-up in 1 month for reevaluation.  We did review side effects including increased acid reflux, constipation, nausea, vomiting, and generalized GI discomfort.  We will keep her on Prilosec 40 mg for the time being may need to advance to Protonix if acid reflux becomes an issue. - Semaglutide-Weight Management 0.25 MG/0.5ML SOAJ; Inject 0.25 mg into the skin once a week.  Dispense: 2 mL; Refill: 0  2. Gastroesophageal reflux disease without esophagitis - Continue Prilosec 40 mg   Dorothyann Peng, NP

## 2021-08-24 ENCOUNTER — Encounter: Payer: Self-pay | Admitting: Internal Medicine

## 2021-08-24 NOTE — Assessment & Plan Note (Signed)
Appropriate education and discussion.  She continues Vyvanse.  Suggested cautious addition of occasional caffeine tablet.  Discussed appropriate nap strategies and sleep hygiene.  Can consider future NPSG/MSLT if there is serious concern of a primary hypersomnolence disorder.

## 2021-08-25 ENCOUNTER — Other Ambulatory Visit (HOSPITAL_COMMUNITY): Payer: Self-pay

## 2021-08-28 ENCOUNTER — Ambulatory Visit: Payer: 59 | Admitting: Adult Health

## 2021-08-28 ENCOUNTER — Other Ambulatory Visit (HOSPITAL_COMMUNITY): Payer: Self-pay

## 2021-08-28 ENCOUNTER — Encounter: Payer: Self-pay | Admitting: Adult Health

## 2021-08-28 VITALS — BP 110/60 | HR 97 | Temp 97.9°F

## 2021-08-28 DIAGNOSIS — J020 Streptococcal pharyngitis: Secondary | ICD-10-CM

## 2021-08-28 DIAGNOSIS — J029 Acute pharyngitis, unspecified: Secondary | ICD-10-CM

## 2021-08-28 LAB — POCT RAPID STREP A (OFFICE): Rapid Strep A Screen: POSITIVE — AB

## 2021-08-28 LAB — POC COVID19 BINAXNOW: SARS Coronavirus 2 Ag: NEGATIVE

## 2021-08-28 MED ORDER — CEPHALEXIN 500 MG PO CAPS
500.0000 mg | ORAL_CAPSULE | Freq: Three times a day (TID) | ORAL | 0 refills | Status: DC
  Filled 2021-08-28: qty 30, 10d supply, fill #0

## 2021-08-28 NOTE — Progress Notes (Signed)
Subjective:    Patient ID: Lisa Hines, female    DOB: 03-13-1996, 26 y.o.   MRN: [id number]  HPI 26 year old female who  has a past medical history of Acute appendicitis with localized peritonitis (10/28/2014), ADD (attention deficit disorder), Family history of adverse reaction to anesthesia, History of Clostridium difficile colitis, Infectious mononucleosis (01/25/2014), Irregular menses (11/08/2010), Knee injury (04/19/2011), Myopia, Pes planus, and Tonsillar hypertrophy.  She presents to the office today for an acute issue of sore throat with associated symptom that include nausea, subjective fevers, nasal and ear congestion. Her symptoms started yesterday.   Her strep was positive. She has a mild allergy to penicillin and has had c.diff from clindamycin in the past.   She tolerated Keflex previously.     Review of Systems See HPI  Past Medical History:  Diagnosis Date   Acute appendicitis with localized peritonitis 10/28/2014   ADD (attention deficit disorder)    Family history of adverse reaction to anesthesia    unknown---pt. is adopted   History of Clostridium difficile colitis    Infectious mononucleosis 01/25/2014   no obvious complication except she complains of severe pain in her throat not responsive to ibuprofen discussed  options pain medicines steroid risk benefit    Irregular menses 11/08/2010   Knee injury 04/19/2011   Poss traumatic bursitis tendinitis knee looks stable  .    Myopia    Pes planus    consult [facility]   Tonsillar hypertrophy     Social History   Socioeconomic History   Marital status: Single    Spouse name: Not on file   Number of children: Not on file   Years of education: Not on file   Highest education level: Bachelor's degree (e.g., BA, AB, BS)  Occupational History   Not on file  Tobacco Use   Smoking status: Never   Smokeless tobacco: Never  Vaping Use   Vaping Use: Every day  Substance and Sexual Activity   Alcohol use: Yes     Alcohol/week: 0.0 standard drinks of alcohol    Comment: ocassionally    Drug use: No   Sexual activity: Not on file  Other Topics Concern   Not on file  Social History Narrative   Caretaker verifies today that the child's current immunizations are up to date.   Child is adopted   Sib adopted   Active at school gymnastics, swimming, tennis and horseback riding seasonal now volleyball   Sleep about 9 hours or so   AL program   [employer]   ib courses   Field hockey diving and lacrosse in past    Intact family      Social Determinants of Health   Financial Resource Strain: Low Risk  (08/22/2021)   Overall Financial Resource Strain (CARDIA)    Difficulty of Paying Living Expenses: Not very hard  Food Insecurity: No Food Insecurity (08/22/2021)   Hunger Vital Sign    Worried About Running Out of Food in the Last Year: Never true    [place] in the Last Year: Never true  Transportation Needs: No Transportation Needs (08/22/2021)   PRAPARE - Hydrologist (Medical): No    Lack of Transportation (Non-Medical): No  Physical Activity: Insufficiently Active (08/22/2021)   Exercise Vital Sign    Days of Exercise per Week: 3 days    Minutes of Exercise per Session: 30 min  Stress: Stress Concern Present (08/22/2021)  [place] Questionnaire    Feeling of Stress : To some extent  Social Connections: Socially Isolated (08/22/2021)   Social Connection and Isolation Panel [NHANES]    Frequency of Communication with Friends and Family: More than three times a week    Frequency of Social Gatherings with Friends and Family: More than three times a week    Attends Religious Services: Never    Marine scientist or Organizations: No    Attends Music therapist: Not on file    Marital Status: Never married  Intimate Partner Violence: Not on file    Past Surgical History:  Procedure Laterality  Date   APPENDECTOMY     LAPAROSCOPIC APPENDECTOMY N/A 10/28/2014   Procedure: APPENDECTOMY LAPAROSCOPIC;  Surgeon: Excell Seltzer, MD;  Location: [facility];  Service: General;  Laterality: N/A;   MYRINGOTOMY     TONSILLECTOMY Bilateral 03/19/2017   Procedure: TONSILLECTOMY;  Surgeon: Melissa Montane, MD;  Location: [place];  Service: ENT;  Laterality: Bilateral;    Family History  Adopted: Yes  Problem Relation Age of Onset   ADD / ADHD Brother        possible ld    Allergies  Allergen Reactions   Lamictal [Lamotrigine] Hives   Clindamycin Other (See Comments)    cdiff colitis   Latex     Other reaction(s): Other, Other (See Comments)   Amoxicillin-Pot Clavulanate Rash   Penicillin G Rash    Current Outpatient Medications on File Prior to Visit  Medication Sig Dispense Refill   buPROPion (WELLBUTRIN XL) 300 MG 24 hr tablet Take 1 tablet by mouth daily. 90 tablet 1   drospirenone-ethinyl estradiol (JASMIEL) 3-0.02 MG tablet Take 1 tablet by mouth daily. Take continuously skip placebos. 112 tablet 3   lamoTRIgine (LAMICTAL) 100 MG tablet Take 1 tablet by mouth daily. 90 tablet 1   lisdexamfetamine (VYVANSE) 40 MG capsule Take 1 capsule by mouth every morning. 90 capsule 0   omeprazole (PRILOSEC) 40 MG capsule Take 1 capsule by mouth daily 90 capsule 1   ondansetron (ZOFRAN) 4 MG tablet Take 1 tablet (4 mg total) by mouth every 8 (eight) hours as needed for nausea or vomiting. 20 tablet 0   Semaglutide-Weight Management 0.25 MG/0.5ML SOAJ Inject 0.25 mg into the skin once a week. 2 mL 0   zolpidem (AMBIEN) 10 MG tablet Take 1 tablet by mouth at bedtime as needed for sleep. 15 tablet 2   No current facility-administered medications on file prior to visit.    BP 110/60   Pulse 97   Temp 97.9 F (36.6 C)   SpO2 97%       Objective:   Physical Exam Vitals and nursing note reviewed.  Constitutional:      Appearance: Normal appearance.  HENT:     Right Ear: A  middle ear effusion is present. Tympanic membrane is not erythematous or bulging.     Left Ear: A middle ear effusion is present. Tympanic membrane is not erythematous or bulging.     Nose: Nose normal. No rhinorrhea.     Right Turbinates: Not enlarged or swollen.     Left Turbinates: Not enlarged or swollen.     Mouth/Throat:     Mouth: Mucous membranes are moist.     Pharynx: Posterior oropharyngeal erythema present.     Tonsils: 0 on the right. 0 on the left.  Cardiovascular:     Rate and  Rhythm: Normal rate and regular rhythm.     Pulses: Normal pulses.     Heart sounds: Normal heart sounds.  Pulmonary:     Effort: Pulmonary effort is normal.     Breath sounds: Normal breath sounds.  Musculoskeletal:        General: Normal range of motion.  Skin:    General: Skin is warm and dry.     Capillary Refill: Capillary refill takes less than 2 seconds.  Neurological:     General: No focal deficit present.     Mental Status: She is alert and oriented to person, place, and time.  Psychiatric:        Mood and Affect: Mood normal.        Behavior: Behavior normal.        Thought Content: Thought content normal.        Judgment: Judgment normal.       Assessment & Plan:  1. Sore throat  - POC COVID-19- Negative  - POC Rapid Strep A- Positive  2. Strep throat  - Culture, Group A Strep - cephALEXin (KEFLEX) 500 MG capsule; Take 1 capsule (500 mg total) by mouth 3 (three) times daily for 10 days.  Dispense: 30 capsule; Refill: 0  Dorothyann Peng, NP

## 2021-08-30 ENCOUNTER — Other Ambulatory Visit: Payer: Self-pay | Admitting: Adult Health

## 2021-08-30 ENCOUNTER — Other Ambulatory Visit (HOSPITAL_COMMUNITY): Payer: Self-pay

## 2021-08-30 LAB — CULTURE, GROUP A STREP
MICRO NUMBER:: 13519189
SPECIMEN QUALITY:: ADEQUATE

## 2021-08-30 MED ORDER — METHYLPREDNISOLONE 4 MG PO TBPK
ORAL_TABLET | ORAL | 0 refills | Status: DC
  Filled 2021-08-30: qty 21, 6d supply, fill #0

## 2021-08-31 ENCOUNTER — Telehealth: Payer: Self-pay

## 2021-08-31 ENCOUNTER — Other Ambulatory Visit (HOSPITAL_COMMUNITY): Payer: Self-pay

## 2021-08-31 DIAGNOSIS — E669 Obesity, unspecified: Secondary | ICD-10-CM

## 2021-08-31 MED ORDER — SEMAGLUTIDE-WEIGHT MANAGEMENT 0.25 MG/0.5ML ~~LOC~~ SOAJ
0.2500 mg | SUBCUTANEOUS | 0 refills | Status: DC
  Filled 2021-08-31: qty 2, 28d supply, fill #0

## 2021-08-31 NOTE — Telephone Encounter (Signed)
Lisa Hines (Key: [id number]) Wegovy 0.'25MG'$ /0.5ML auto-injectors   Form MedImpact ePA Form 2017 NCPDP Created 8 days ago Sent to Plan 8 days ago Plan Response 8 days ago Submit Clinical Questions 3 days ago Determination Favorable 2 days ago Your prior authorization for Mancel [facility] has been approved!

## 2021-08-31 NOTE — Addendum Note (Signed)
Addended by: Gwenyth Ober R on: 08/31/2021 08:43 AM   Modules accepted: Orders

## 2021-09-03 ENCOUNTER — Encounter: Payer: Self-pay | Admitting: Adult Health

## 2021-09-03 DIAGNOSIS — F902 Attention-deficit hyperactivity disorder, combined type: Secondary | ICD-10-CM | POA: Diagnosis not present

## 2021-09-04 ENCOUNTER — Other Ambulatory Visit: Payer: Self-pay | Admitting: Adult Health

## 2021-09-04 ENCOUNTER — Other Ambulatory Visit (HOSPITAL_COMMUNITY): Payer: Self-pay

## 2021-09-04 MED ORDER — FLUCONAZOLE 150 MG PO TABS
150.0000 mg | ORAL_TABLET | Freq: Once | ORAL | 6 refills | Status: DC
  Filled 2021-09-05: qty 1, 1d supply, fill #0
  Filled 2021-09-06: qty 1, 1d supply, fill #1

## 2021-09-04 NOTE — Telephone Encounter (Signed)
Pt advised that she still has refills at pharmacy. PT advise to call us back if refills still need to be sent to pharmacy. Pt verbalized understanding.

## 2021-09-05 ENCOUNTER — Other Ambulatory Visit (HOSPITAL_COMMUNITY): Payer: Self-pay

## 2021-09-07 ENCOUNTER — Other Ambulatory Visit: Payer: Self-pay

## 2021-09-07 ENCOUNTER — Other Ambulatory Visit (HOSPITAL_COMMUNITY): Payer: Self-pay

## 2021-09-07 MED ORDER — FLUCONAZOLE 150 MG PO TABS
150.0000 mg | ORAL_TABLET | Freq: Once | ORAL | 1 refills | Status: DC
Start: 2021-09-07 — End: 2021-09-07
  Filled 2021-09-07: qty 1, 1d supply, fill #0
  Filled 2021-09-07: qty 1, 1d supply, fill #1

## 2021-09-07 MED ORDER — FLUCONAZOLE 150 MG PO TABS
150.0000 mg | ORAL_TABLET | Freq: Once | ORAL | 0 refills | Status: AC
  Filled 2021-09-07: qty 1, 1d supply, fill #0

## 2021-09-08 ENCOUNTER — Other Ambulatory Visit (HOSPITAL_COMMUNITY): Payer: Self-pay

## 2021-09-19 DIAGNOSIS — F902 Attention-deficit hyperactivity disorder, combined type: Secondary | ICD-10-CM | POA: Diagnosis not present

## 2021-09-20 ENCOUNTER — Telehealth: Payer: 59 | Admitting: Adult Health

## 2021-09-25 ENCOUNTER — Other Ambulatory Visit (HOSPITAL_COMMUNITY): Payer: Self-pay

## 2021-10-01 DIAGNOSIS — F902 Attention-deficit hyperactivity disorder, combined type: Secondary | ICD-10-CM | POA: Diagnosis not present

## 2021-10-08 ENCOUNTER — Other Ambulatory Visit (HOSPITAL_COMMUNITY): Payer: Self-pay

## 2021-10-09 ENCOUNTER — Other Ambulatory Visit (HOSPITAL_COMMUNITY): Payer: Self-pay

## 2021-10-09 ENCOUNTER — Telehealth (INDEPENDENT_AMBULATORY_CARE_PROVIDER_SITE_OTHER): Payer: 59 | Admitting: Adult Health

## 2021-10-09 ENCOUNTER — Encounter: Payer: Self-pay | Admitting: Adult Health

## 2021-10-09 ENCOUNTER — Other Ambulatory Visit: Payer: Self-pay | Admitting: Adult Health

## 2021-10-09 VITALS — Wt 182.6 lb

## 2021-10-09 DIAGNOSIS — E669 Obesity, unspecified: Secondary | ICD-10-CM

## 2021-10-09 DIAGNOSIS — R110 Nausea: Secondary | ICD-10-CM | POA: Diagnosis not present

## 2021-10-09 MED ORDER — DROSPIRENONE-ETHINYL ESTRADIOL 3-0.02 MG PO TABS
ORAL_TABLET | ORAL | 0 refills | Status: DC
  Filled 2021-10-09: qty 112, 84d supply, fill #0

## 2021-10-09 MED ORDER — WEGOVY 0.5 MG/0.5ML ~~LOC~~ SOAJ
0.5000 mg | SUBCUTANEOUS | 0 refills | Status: DC
  Filled 2021-10-09: qty 2, 28d supply, fill #0

## 2021-10-09 MED ORDER — ONDANSETRON HCL 4 MG PO TABS
4.0000 mg | ORAL_TABLET | Freq: Three times a day (TID) | ORAL | 2 refills | Status: DC | PRN
  Filled 2021-10-09: qty 20, 7d supply, fill #0
  Filled 2021-11-27: qty 20, 7d supply, fill #1
  Filled 2022-04-02: qty 20, 7d supply, fill #2

## 2021-10-09 NOTE — Progress Notes (Signed)
Virtual Visit via Video Note  I connected with Lisa Hines  on 10/09/21 at  1:30 PM EDT by a video enabled telemedicine application and verified that I am speaking with the correct person using two identifiers.  Location patient: home Location provider:work or home office Persons participating in the virtual visit: patient, provider  I discussed the limitations of evaluation and management by telemedicine and the availability of in person appointments. The patient expressed understanding and agreed to proceed.   HPI: She is being evaluated today for 1 month follow-up regarding class I obesity/weight loss management.  About a month ago she was started on Wegovy 0.25 mg weekly.  She reports that since starting the medication she had some fatigue and nausea with the first two injections but this has resolved. She has noticed that her appetite has been curbed. Denies any other side effects. She would like to increase her dose    Wt Readings from Last 3 Encounters:  10/09/21 182 lb 9.6 oz (82.8 kg)  08/23/21 193 lb 9.6 oz (87.8 kg)  07/20/21 184 lb 9.6 oz (83.7 kg)      ROS: See pertinent positives and negatives per HPI.  Past Medical History:  Diagnosis Date   Acute appendicitis with localized peritonitis 10/28/2014   ADD (attention deficit disorder)    Family history of adverse reaction to anesthesia    unknown---pt. is adopted   History of Clostridium difficile colitis    Infectious mononucleosis 01/25/2014   no obvious complication except she complains of severe pain in her throat not responsive to ibuprofen discussed  options pain medicines steroid risk benefit    Irregular menses 11/08/2010   Knee injury 04/19/2011   Poss traumatic bursitis tendinitis knee looks stable  .    Myopia    Pes planus    consult [facility]   Tonsillar hypertrophy     Past Surgical History:  Procedure Laterality Date   APPENDECTOMY     LAPAROSCOPIC APPENDECTOMY N/A 10/28/2014   Procedure: APPENDECTOMY  LAPAROSCOPIC;  Surgeon: Excell Seltzer, MD;  Location: [facility];  Service: General;  Laterality: N/A;   MYRINGOTOMY     TONSILLECTOMY Bilateral 03/19/2017   Procedure: TONSILLECTOMY;  Surgeon: Melissa Montane, MD;  Location: [place];  Service: ENT;  Laterality: Bilateral;    Family History  Adopted: Yes  Problem Relation Age of Onset   ADD / ADHD Brother        possible ld       Current Outpatient Medications:    buPROPion (WELLBUTRIN XL) 150 MG 24 hr tablet, Take 1 tablet by mouth every morning., Disp: , Rfl:    drospirenone-ethinyl estradiol (JASMIEL) 3-0.02 MG tablet, TAKE 1 TABLET BY MOUTH EVERY DAY -TAKE CONTINUOUSLY SKIP PLACEBOS, Disp: 112 tablet, Rfl: 0   EPINEPHrine 0.3 mg/0.3 mL IJ SOAJ injection, USE AS DIRECTED AS NEEDED, Disp: , Rfl:    lamoTRIgine (LAMICTAL) 25 MG tablet, Take 3 tablets by mouth daily., Disp: , Rfl:    lisdexamfetamine (VYVANSE) 40 MG capsule, Take 1 capsule by mouth every morning., Disp: , Rfl:    omeprazole (PRILOSEC) 40 MG capsule, Take 1 capsule by mouth daily., Disp: , Rfl:    ondansetron (ZOFRAN) 4 MG tablet, Take 1 tablet (4 mg total) by mouth every 8 (eight) hours as needed for nausea or vomiting., Disp: 20 tablet, Rfl: 0   Semaglutide-Weight Management 0.25 MG/0.5ML SOAJ, Inject 0.25 mg into the skin once a week., Disp: 2 mL, Rfl: 0   zolpidem (  AMBIEN) 10 MG tablet, Take 1 tablet by mouth at bedtime as needed for sleep., Disp: 15 tablet, Rfl: 2  EXAM:  VITALS per patient if applicable:  GENERAL: alert, oriented, appears well and in no acute distress  HEENT: atraumatic, conjunttiva clear, no obvious abnormalities on inspection of external nose and ears  NECK: normal movements of the head and neck  LUNGS: on inspection no signs of respiratory distress, breathing rate appears normal, no obvious gross SOB, gasping or wheezing  CV: no obvious cyanosis  MS: moves all visible extremities without noticeable  abnormality  PSYCH/NEURO: pleasant and cooperative, no obvious depression or anxiety, speech and thought processing grossly intact  ASSESSMENT AND PLAN:  Discussed the following assessment and plan:  1. Class 1 obesity - Will increase to 0.5 mg weekly.  - Follow up in one month  - Semaglutide-Weight Management (WEGOVY) 0.5 MG/0.5ML SOAJ; Inject 0.5 mg into the skin once a week.  Dispense: 2 mL; Refill: 0  2. Nausea -  - ondansetron (ZOFRAN) 4 MG tablet; Take 1 tablet (4 mg total) by mouth every 8 (eight) hours as needed for nausea or vomiting.  Dispense: 20 tablet; Refill: 2      I discussed the assessment and treatment plan with the patient. The patient was provided an opportunity to ask questions and all were answered. The patient agreed with the plan and demonstrated an understanding of the instructions.   The patient was advised to call back or seek an in-person evaluation if the symptoms worsen or if the condition fails to improve as anticipated.   Dorothyann Peng, NP

## 2021-10-10 ENCOUNTER — Other Ambulatory Visit (HOSPITAL_COMMUNITY): Payer: Self-pay

## 2021-10-10 ENCOUNTER — Other Ambulatory Visit: Payer: Self-pay | Admitting: Adult Health

## 2021-10-10 MED ORDER — BUPROPION HCL ER (XL) 150 MG PO TB24
150.0000 mg | ORAL_TABLET | Freq: Every morning | ORAL | 0 refills | Status: DC
  Filled 2021-10-10: qty 90, 90d supply, fill #0

## 2021-10-10 MED ORDER — OMEPRAZOLE 40 MG PO CPDR
40.0000 mg | DELAYED_RELEASE_CAPSULE | Freq: Every day | ORAL | 0 refills | Status: DC
  Filled 2021-10-10: qty 90, 90d supply, fill #0

## 2021-10-10 MED ORDER — LAMOTRIGINE 25 MG PO TABS
75.0000 mg | ORAL_TABLET | Freq: Every day | ORAL | 2 refills | Status: DC
  Filled 2021-10-10 – 2021-10-26 (×2): qty 90, 30d supply, fill #0

## 2021-10-17 ENCOUNTER — Other Ambulatory Visit (HOSPITAL_COMMUNITY): Payer: Self-pay

## 2021-10-17 ENCOUNTER — Telehealth: Payer: 59 | Admitting: Adult Health

## 2021-10-17 DIAGNOSIS — Z01419 Encounter for gynecological examination (general) (routine) without abnormal findings: Secondary | ICD-10-CM | POA: Diagnosis not present

## 2021-10-17 DIAGNOSIS — Z113 Encounter for screening for infections with a predominantly sexual mode of transmission: Secondary | ICD-10-CM | POA: Diagnosis not present

## 2021-10-17 MED ORDER — DROSPIRENONE-ETHINYL ESTRADIOL 3-0.02 MG PO TABS
ORAL_TABLET | ORAL | 3 refills | Status: DC
Start: 2021-10-17 — End: 2022-10-21
  Filled 2021-10-17 – 2022-01-04 (×2): qty 112, 84d supply, fill #0
  Filled 2022-06-18: qty 112, 84d supply, fill #1
  Filled 2022-09-11: qty 112, 84d supply, fill #2

## 2021-10-18 ENCOUNTER — Other Ambulatory Visit (HOSPITAL_COMMUNITY): Payer: Self-pay

## 2021-10-22 DIAGNOSIS — F902 Attention-deficit hyperactivity disorder, combined type: Secondary | ICD-10-CM | POA: Diagnosis not present

## 2021-10-24 ENCOUNTER — Other Ambulatory Visit (HOSPITAL_COMMUNITY): Payer: Self-pay

## 2021-10-26 ENCOUNTER — Telehealth: Payer: Self-pay | Admitting: Adult Health

## 2021-10-26 ENCOUNTER — Other Ambulatory Visit (HOSPITAL_COMMUNITY): Payer: Self-pay

## 2021-10-26 NOTE — Telephone Encounter (Signed)
FYI  Pt was wanting to know since she has not been on her medication for a while if she should increase her dosage. I advised that since her dose finally came in from back order she should continue that therapy and f/u with [place] the following month. Pt verbalized understanding.

## 2021-10-26 NOTE — Telephone Encounter (Signed)
Pt has question about Semaglutide-Weight Management (WEGOVY) 0.5 MG/0.5ML SOAJ, regarding her dosage.

## 2021-11-05 DIAGNOSIS — F902 Attention-deficit hyperactivity disorder, combined type: Secondary | ICD-10-CM | POA: Diagnosis not present

## 2021-11-16 ENCOUNTER — Telehealth (INDEPENDENT_AMBULATORY_CARE_PROVIDER_SITE_OTHER): Payer: 59 | Admitting: Adult Health

## 2021-11-16 ENCOUNTER — Other Ambulatory Visit (HOSPITAL_COMMUNITY): Payer: Self-pay

## 2021-11-16 ENCOUNTER — Encounter: Payer: Self-pay | Admitting: Adult Health

## 2021-11-16 ENCOUNTER — Ambulatory Visit: Payer: 59 | Admitting: Adult Health

## 2021-11-16 VITALS — Ht 67.0 in | Wt 176.0 lb

## 2021-11-16 DIAGNOSIS — E669 Obesity, unspecified: Secondary | ICD-10-CM | POA: Diagnosis not present

## 2021-11-16 DIAGNOSIS — F39 Unspecified mood [affective] disorder: Secondary | ICD-10-CM

## 2021-11-16 MED ORDER — WEGOVY 0.25 MG/0.5ML ~~LOC~~ SOAJ
0.2500 mg | SUBCUTANEOUS | 0 refills | Status: DC
  Filled 2021-11-16: qty 2, 28d supply, fill #0

## 2021-11-16 MED ORDER — BUPROPION HCL ER (XL) 300 MG PO TB24
300.0000 mg | ORAL_TABLET | Freq: Every day | ORAL | 0 refills | Status: DC
  Filled 2021-11-16 – 2022-01-04 (×2): qty 90, 90d supply, fill #0

## 2021-11-16 NOTE — Progress Notes (Signed)
Virtual Visit via Video Note  I connected with Lisa Hines on 11/16/21 at  9:00 AM EDT by a video enabled telemedicine application and verified that I am speaking with the correct person using two identifiers.  Location patient: home Location provider:work or home office Persons participating in the virtual visit: patient, provider  I discussed the limitations of evaluation and management by telemedicine and the availability of in person appointments. The patient expressed understanding and agreed to proceed.   HPI: 26 year old female who  has a past medical history of Acute appendicitis with localized peritonitis (10/28/2014), ADD (attention deficit disorder), Family history of adverse reaction to anesthesia, History of Clostridium difficile colitis, Infectious mononucleosis (01/25/2014), Irregular menses (11/08/2010), Knee injury (04/19/2011), Myopia, Pes planus, and Tonsillar hypertrophy.  She is being evaluated today for follow up regarding weight loss management/obesity. She is currently managed with Wegovy 0.5 mg weekly.  She reports that since starting the 0.5 mg dose that she has had more abdominal cramping and nausea.  He has been spacing out the injections every 10 days but have not had any improvement abdominal cramping.  She would like to go back to the 0.25 mg dose  Wt Readings from Last 3 Encounters:  11/16/21 176 lb (79.8 kg)  10/09/21 182 lb 9.6 oz (82.8 kg)  08/23/21 193 lb 9.6 oz (87.8 kg)   This generally, she reports that over the last few weeks she has been "more snappy" and believes that this may be due to work stress.  She is interested in increasing either Lamictal or Wellbutrin to see if this helps.   ROS: See pertinent positives and negatives per HPI.  Past Medical History:  Diagnosis Date   Acute appendicitis with localized peritonitis 10/28/2014   ADD (attention deficit disorder)    Family history of adverse reaction to anesthesia    unknown---pt. is adopted    History of Clostridium difficile colitis    Infectious mononucleosis 01/25/2014   no obvious complication except she complains of severe pain in her throat not responsive to ibuprofen discussed  options pain medicines steroid risk benefit    Irregular menses 11/08/2010   Knee injury 04/19/2011   Poss traumatic bursitis tendinitis knee looks stable  .    Myopia    Pes planus    consult [facility]   Tonsillar hypertrophy     Past Surgical History:  Procedure Laterality Date   APPENDECTOMY     LAPAROSCOPIC APPENDECTOMY N/A 10/28/2014   Procedure: APPENDECTOMY LAPAROSCOPIC;  Surgeon: Excell Seltzer, MD;  Location: [facility];  Service: General;  Laterality: N/A;   MYRINGOTOMY     TONSILLECTOMY Bilateral 03/19/2017   Procedure: TONSILLECTOMY;  Surgeon: Melissa Montane, MD;  Location: [place];  Service: ENT;  Laterality: Bilateral;    Family History  Adopted: Yes  Problem Relation Age of Onset   ADD / ADHD Brother        possible ld       Current Outpatient Medications:    buPROPion (WELLBUTRIN XL) 150 MG 24 hr tablet, Take 1 tablet (150 mg total) by mouth every morning., Disp: 90 tablet, Rfl: 0   drospirenone-ethinyl estradiol (JASMIEL) 3-0.02 MG tablet, TAKE 1 TABLET BY MOUTH EVERY DAY -TAKE CONTINUOUSLY SKIP PLACEBOS, Disp: 112 tablet, Rfl: 3   EPINEPHrine 0.3 mg/0.3 mL IJ SOAJ injection, USE AS DIRECTED AS NEEDED, Disp: , Rfl:    lamoTRIgine (LAMICTAL) 25 MG tablet, Take 3 tablets (75 mg total) by mouth daily., Disp: 90 tablet,  Rfl: 2   lisdexamfetamine (VYVANSE) 40 MG capsule, Take 1 capsule by mouth every morning., Disp: , Rfl:    omeprazole (PRILOSEC) 40 MG capsule, Take 1 capsule (40 mg total) by mouth daily., Disp: 90 capsule, Rfl: 0   ondansetron (ZOFRAN) 4 MG tablet, Take 1 tablet by mouth every 8 hours as needed for nausea or vomiting., Disp: 20 tablet, Rfl: 2   Semaglutide-Weight Management (WEGOVY) 0.5 MG/0.5ML SOAJ, Inject 0.5 mg into the skin once a week., Disp:  2 mL, Rfl: 0   zolpidem (AMBIEN) 10 MG tablet, Take 1 tablet by mouth at bedtime as needed for sleep., Disp: 15 tablet, Rfl: 2  EXAM:  VITALS per patient if applicable:  GENERAL: alert, oriented, appears well and in no acute distress  HEENT: atraumatic, conjunttiva clear, no obvious abnormalities on inspection of external nose and ears  NECK: normal movements of the head and neck  LUNGS: on inspection no signs of respiratory distress, breathing rate appears normal, no obvious gross SOB, gasping or wheezing  CV: no obvious cyanosis  MS: moves all visible extremities without noticeable abnormality  PSYCH/NEURO: pleasant and cooperative, no obvious depression or anxiety, speech and thought processing grossly intact  ASSESSMENT AND PLAN:  Discussed the following assessment and plan:  1. Mood disorder (HCC) - Will increase Wellbutrin to 300 mg  - Follow up  in one month  - buPROPion (WELLBUTRIN XL) 300 MG 24 hr tablet; Take 1 tablet (300 mg total) by mouth daily.  Dispense: 90 tablet; Refill: 0  2. Class 1 obesity - Will decrease dose to 0.25 mg. She has lost about 17 pounds over the last three months.  - Follow up in one month  - Semaglutide-Weight Management (WEGOVY) 0.25 MG/0.5ML SOAJ; Inject 0.25 mg into the skin once a week.  Dispense: 2 mL; Refill: 0      I discussed the assessment and treatment plan with the patient. The patient was provided an opportunity to ask questions and all were answered. The patient agreed with the plan and demonstrated an understanding of the instructions.   The patient was advised to call back or seek an in-person evaluation if the symptoms worsen or if the condition fails to improve as anticipated.   Dorothyann Peng, NP

## 2021-11-20 ENCOUNTER — Other Ambulatory Visit (HOSPITAL_COMMUNITY): Payer: Self-pay

## 2021-11-20 ENCOUNTER — Encounter: Payer: Self-pay | Admitting: Adult Health

## 2021-11-20 ENCOUNTER — Other Ambulatory Visit: Payer: Self-pay | Admitting: Adult Health

## 2021-11-20 MED ORDER — LAMOTRIGINE 100 MG PO TABS
100.0000 mg | ORAL_TABLET | Freq: Every day | ORAL | 0 refills | Status: DC
  Filled 2021-11-20: qty 90, 90d supply, fill #0

## 2021-11-20 NOTE — Telephone Encounter (Signed)
Please advise 

## 2021-11-22 DIAGNOSIS — F902 Attention-deficit hyperactivity disorder, combined type: Secondary | ICD-10-CM | POA: Diagnosis not present

## 2021-11-27 ENCOUNTER — Other Ambulatory Visit: Payer: Self-pay | Admitting: Adult Health

## 2021-11-28 ENCOUNTER — Other Ambulatory Visit: Payer: Self-pay | Admitting: Adult Health

## 2021-11-28 ENCOUNTER — Other Ambulatory Visit (HOSPITAL_COMMUNITY): Payer: Self-pay

## 2021-11-29 ENCOUNTER — Other Ambulatory Visit (HOSPITAL_COMMUNITY): Payer: Self-pay

## 2021-11-29 MED ORDER — LISDEXAMFETAMINE DIMESYLATE 40 MG PO CAPS
40.0000 mg | ORAL_CAPSULE | ORAL | 0 refills | Status: DC
  Filled 2021-11-29: qty 30, 30d supply, fill #0

## 2021-11-29 MED ORDER — LISDEXAMFETAMINE DIMESYLATE 40 MG PO CAPS
40.0000 mg | ORAL_CAPSULE | Freq: Every morning | ORAL | 0 refills | Status: DC
  Filled 2021-11-29 – 2022-02-19 (×3): qty 30, 30d supply, fill #0

## 2021-11-29 MED ORDER — LISDEXAMFETAMINE DIMESYLATE 40 MG PO CAPS
40.0000 mg | ORAL_CAPSULE | ORAL | 0 refills | Status: DC
  Filled 2021-11-29 – 2022-01-04 (×2): qty 30, 30d supply, fill #0

## 2021-12-06 DIAGNOSIS — F902 Attention-deficit hyperactivity disorder, combined type: Secondary | ICD-10-CM | POA: Diagnosis not present

## 2021-12-10 ENCOUNTER — Other Ambulatory Visit (HOSPITAL_COMMUNITY): Payer: Self-pay

## 2021-12-13 ENCOUNTER — Encounter: Payer: Self-pay | Admitting: Adult Health

## 2021-12-19 ENCOUNTER — Telehealth: Payer: Self-pay | Admitting: Adult Health

## 2021-12-19 ENCOUNTER — Other Ambulatory Visit (HOSPITAL_COMMUNITY): Payer: Self-pay

## 2021-12-19 NOTE — Telephone Encounter (Signed)
Pt is calling and would like cory to return her call nothing urgent

## 2021-12-20 ENCOUNTER — Telehealth: Payer: Self-pay | Admitting: Adult Health

## 2021-12-20 DIAGNOSIS — F902 Attention-deficit hyperactivity disorder, combined type: Secondary | ICD-10-CM | POA: Diagnosis not present

## 2021-12-20 MED ORDER — WEGOVY 0.5 MG/0.5ML ~~LOC~~ SOAJ: 0.5000 mg | SUBCUTANEOUS | 2 refills | Status: DC

## 2021-12-20 NOTE — Telephone Encounter (Signed)
Spoke to patient and she would like me to send in [place] to [facility] at [facility]

## 2021-12-20 NOTE — Telephone Encounter (Signed)
Pt called, returning NP's call. NP was unavailable. Pt asked that NPcall back at his earliest convenience.

## 2021-12-21 ENCOUNTER — Other Ambulatory Visit (HOSPITAL_COMMUNITY): Payer: Self-pay

## 2021-12-25 ENCOUNTER — Other Ambulatory Visit: Payer: Self-pay | Admitting: Adult Health

## 2021-12-25 ENCOUNTER — Other Ambulatory Visit (HOSPITAL_COMMUNITY): Payer: Self-pay

## 2021-12-25 MED ORDER — WEGOVY 0.5 MG/0.5ML ~~LOC~~ SOAJ
0.5000 mg | SUBCUTANEOUS | 2 refills | Status: DC
  Filled 2021-12-25: qty 2, 28d supply, fill #0

## 2021-12-27 ENCOUNTER — Other Ambulatory Visit (HOSPITAL_COMMUNITY): Payer: Self-pay

## 2022-01-01 ENCOUNTER — Telehealth: Payer: Self-pay

## 2022-01-02 ENCOUNTER — Ambulatory Visit: Payer: 59

## 2022-01-02 NOTE — Telephone Encounter (Signed)
Schedule pt for [place]

## 2022-01-03 DIAGNOSIS — F902 Attention-deficit hyperactivity disorder, combined type: Secondary | ICD-10-CM | POA: Diagnosis not present

## 2022-01-04 ENCOUNTER — Other Ambulatory Visit: Payer: Self-pay | Admitting: Adult Health

## 2022-01-04 ENCOUNTER — Other Ambulatory Visit (HOSPITAL_COMMUNITY): Payer: Self-pay

## 2022-01-04 MED ORDER — OMEPRAZOLE 40 MG PO CPDR
40.0000 mg | DELAYED_RELEASE_CAPSULE | Freq: Every day | ORAL | 0 refills | Status: DC
  Filled 2022-01-04: qty 90, 90d supply, fill #0

## 2022-01-04 NOTE — Telephone Encounter (Signed)
Okay for refill?  

## 2022-01-11 ENCOUNTER — Other Ambulatory Visit (HOSPITAL_COMMUNITY): Payer: Self-pay

## 2022-01-16 DIAGNOSIS — F902 Attention-deficit hyperactivity disorder, combined type: Secondary | ICD-10-CM | POA: Diagnosis not present

## 2022-01-31 DIAGNOSIS — F902 Attention-deficit hyperactivity disorder, combined type: Secondary | ICD-10-CM | POA: Diagnosis not present

## 2022-02-14 DIAGNOSIS — F902 Attention-deficit hyperactivity disorder, combined type: Secondary | ICD-10-CM | POA: Diagnosis not present

## 2022-02-19 ENCOUNTER — Other Ambulatory Visit (HOSPITAL_COMMUNITY): Payer: Self-pay

## 2022-02-19 ENCOUNTER — Other Ambulatory Visit: Payer: Self-pay | Admitting: Adult Health

## 2022-02-19 MED ORDER — LAMOTRIGINE 100 MG PO TABS
100.0000 mg | ORAL_TABLET | Freq: Every day | ORAL | 0 refills | Status: DC
  Filled 2022-02-19: qty 90, 90d supply, fill #0

## 2022-02-21 ENCOUNTER — Other Ambulatory Visit (HOSPITAL_COMMUNITY): Payer: Self-pay

## 2022-02-27 ENCOUNTER — Encounter: Payer: Self-pay | Admitting: Adult Health

## 2022-03-01 ENCOUNTER — Other Ambulatory Visit (HOSPITAL_COMMUNITY): Payer: Self-pay

## 2022-03-05 ENCOUNTER — Telehealth (INDEPENDENT_AMBULATORY_CARE_PROVIDER_SITE_OTHER): Payer: 59 | Admitting: Adult Health

## 2022-03-05 VITALS — Ht 67.0 in | Wt 159.0 lb

## 2022-03-05 DIAGNOSIS — E669 Obesity, unspecified: Secondary | ICD-10-CM

## 2022-03-05 NOTE — Progress Notes (Signed)
Virtual Visit via Video Note  I connected with Lisa Hines on 03/05/22 at  8:30 AM EST by a video enabled telemedicine application and verified that I am speaking with the correct person using two identifiers.  Location patient: home Location provider:work or home office Persons participating in the virtual visit: patient, provider  I discussed the limitations of evaluation and management by telemedicine and the availability of in person appointments. The patient expressed understanding and agreed to proceed.   HPI: 26 year old female who is being evaluated today for class I obesity.  In the past she was on Wegovy but has not been able to get this medication for multiple months due to backorder.  She is inquiring about starting Mounjaro or Zepbound.      ROS: See pertinent positives and negatives per HPI.  Past Medical History:  Diagnosis Date   Acute appendicitis with localized peritonitis 10/28/2014   ADD (attention deficit disorder)    Family history of adverse reaction to anesthesia    unknown---pt. is adopted   History of Clostridium difficile colitis    Infectious mononucleosis 01/25/2014   no obvious complication except she complains of severe pain in her throat not responsive to ibuprofen discussed  options pain medicines steroid risk benefit    Irregular menses 11/08/2010   Knee injury 04/19/2011   Poss traumatic bursitis tendinitis knee looks stable  .    Myopia    Pes planus    consult [facility]   Tonsillar hypertrophy     Past Surgical History:  Procedure Laterality Date   APPENDECTOMY     LAPAROSCOPIC APPENDECTOMY N/A 10/28/2014   Procedure: APPENDECTOMY LAPAROSCOPIC;  Surgeon: Excell Seltzer, MD;  Location: [facility];  Service: General;  Laterality: N/A;   MYRINGOTOMY     TONSILLECTOMY Bilateral 03/19/2017   Procedure: TONSILLECTOMY;  Surgeon: Melissa Montane, MD;  Location: [place];  Service: ENT;  Laterality: Bilateral;    Family History  Adopted: Yes   Problem Relation Age of Onset   ADD / ADHD Brother        possible ld       Current Outpatient Medications:    buPROPion (WELLBUTRIN XL) 300 MG 24 hr tablet, Take 1 tablet (300 mg total) by mouth daily., Disp: 90 tablet, Rfl: 0   [employer] SUNSCREEN SPF 30 EX, apply topically to face and body daily for 30, Disp: , Rfl:    drospirenone-ethinyl estradiol (JASMIEL) 3-0.02 MG tablet, TAKE 1 TABLET BY MOUTH EVERY DAY -TAKE CONTINUOUSLY SKIP PLACEBOS, Disp: 112 tablet, Rfl: 3   EPINEPHrine 0.3 mg/0.3 mL IJ SOAJ injection, USE AS DIRECTED AS NEEDED, Disp: , Rfl:    lamoTRIgine (LAMICTAL) 100 MG tablet, Take 1 tablet (100 mg total) by mouth daily., Disp: 90 tablet, Rfl: 0   lisdexamfetamine (VYVANSE) 40 MG capsule, Take 1 capsule (40 mg total) by mouth every morning. (november), Disp: 30 capsule, Rfl: 0   lisdexamfetamine (VYVANSE) 40 MG capsule, Take 1 capsule (40 mg total) by mouth every morning. (october), Disp: 30 capsule, Rfl: 0   lisdexamfetamine (VYVANSE) 40 MG capsule, Take 1 capsule (40 mg total) by mouth every morning., Disp: 30 capsule, Rfl: 0   omeprazole (PRILOSEC) 40 MG capsule, Take 1 capsule (40 mg total) by mouth daily., Disp: 90 capsule, Rfl: 0   ondansetron (ZOFRAN) 4 MG tablet, Take 1 tablet by mouth every 8 hours as needed for nausea or vomiting., Disp: 20 tablet, Rfl: 2   Semaglutide-Weight Management (WEGOVY) 0.5 MG/0.5ML SOAJ, Inject 0.5  mg into the skin once a week., Disp: 2 mL, Rfl: 2   zolpidem (AMBIEN) 10 MG tablet, Take 1 tablet by mouth at bedtime as needed for sleep., Disp: 15 tablet, Rfl: 2  EXAM:  VITALS per patient if applicable:  GENERAL: alert, oriented, appears well and in no acute distress  HEENT: atraumatic, conjunttiva clear, no obvious abnormalities on inspection of external nose and ears  NECK: normal movements of the head and neck  LUNGS: on inspection no signs of respiratory distress, breathing rate appears normal, no obvious gross SOB, gasping or  wheezing  CV: no obvious cyanosis  MS: moves all visible extremities without noticeable abnormality  PSYCH/NEURO: pleasant and cooperative, no obvious depression or anxiety, speech and thought processing grossly intact  ASSESSMENT AND PLAN:  Discussed the following assessment and plan:  1. Class 1 obesity -Advised that unfortunately Mancel Parsons is still on backorder.  She would not be a candidate for Mounjaro due to being nondiabetic.  That bound is not covered by her insurance yet.  I advised her to send me a message at the beginning of the year to see if anything has changed with [facility] or if her insurance is covering Zepbound       I discussed the assessment and treatment plan with the patient. The patient was provided an opportunity to ask questions and all were answered. The patient agreed with the plan and demonstrated an understanding of the instructions.   The patient was advised to call back or seek an in-person evaluation if the symptoms worsen or if the condition fails to improve as anticipated.   Dorothyann Peng, NP

## 2022-03-07 DIAGNOSIS — F902 Attention-deficit hyperactivity disorder, combined type: Secondary | ICD-10-CM | POA: Diagnosis not present

## 2022-03-21 DIAGNOSIS — F902 Attention-deficit hyperactivity disorder, combined type: Secondary | ICD-10-CM | POA: Diagnosis not present

## 2022-03-27 ENCOUNTER — Other Ambulatory Visit: Payer: Self-pay | Admitting: Adult Health

## 2022-03-27 ENCOUNTER — Other Ambulatory Visit (HOSPITAL_COMMUNITY): Payer: Self-pay

## 2022-03-27 MED ORDER — SEMAGLUTIDE-WEIGHT MANAGEMENT 1 MG/0.5ML ~~LOC~~ SOAJ
1.0000 mg | SUBCUTANEOUS | 2 refills | Status: DC
  Filled 2022-03-27 – 2022-05-07 (×3): qty 2, 28d supply, fill #0
  Filled 2022-05-30: qty 2, 28d supply, fill #1
  Filled 2022-06-14 – 2022-06-22 (×2): qty 2, 28d supply, fill #2

## 2022-03-27 NOTE — Telephone Encounter (Signed)
Please advise 

## 2022-03-28 ENCOUNTER — Other Ambulatory Visit (HOSPITAL_COMMUNITY): Payer: Self-pay

## 2022-04-02 ENCOUNTER — Other Ambulatory Visit: Payer: Self-pay | Admitting: Adult Health

## 2022-04-02 DIAGNOSIS — F39 Unspecified mood [affective] disorder: Secondary | ICD-10-CM

## 2022-04-03 ENCOUNTER — Other Ambulatory Visit (HOSPITAL_COMMUNITY): Payer: Self-pay

## 2022-04-03 ENCOUNTER — Other Ambulatory Visit: Payer: Self-pay

## 2022-04-03 MED ORDER — OMEPRAZOLE 40 MG PO CPDR
40.0000 mg | DELAYED_RELEASE_CAPSULE | Freq: Every day | ORAL | 1 refills | Status: DC
  Filled 2022-04-03: qty 90, 90d supply, fill #0
  Filled 2022-08-09 – 2022-08-19 (×2): qty 90, 90d supply, fill #1

## 2022-04-03 MED ORDER — LISDEXAMFETAMINE DIMESYLATE 40 MG PO CAPS
40.0000 mg | ORAL_CAPSULE | ORAL | 0 refills | Status: DC
  Filled 2022-04-03: qty 30, 30d supply, fill #0

## 2022-04-03 MED ORDER — LISDEXAMFETAMINE DIMESYLATE 40 MG PO CAPS
40.0000 mg | ORAL_CAPSULE | Freq: Every morning | ORAL | 0 refills | Status: DC
  Filled 2022-04-03: qty 30, 30d supply, fill #0

## 2022-04-03 MED ORDER — LISDEXAMFETAMINE DIMESYLATE 40 MG PO CAPS
40.0000 mg | ORAL_CAPSULE | ORAL | 0 refills | Status: DC
  Filled 2022-04-03 – 2022-05-21 (×2): qty 30, 30d supply, fill #0

## 2022-04-03 MED ORDER — LAMOTRIGINE 100 MG PO TABS
100.0000 mg | ORAL_TABLET | Freq: Every day | ORAL | 1 refills | Status: DC
  Filled 2022-04-03 – 2022-06-18 (×2): qty 90, 90d supply, fill #0
  Filled 2022-10-14: qty 90, 90d supply, fill #1

## 2022-04-03 MED ORDER — BUPROPION HCL ER (XL) 300 MG PO TB24
300.0000 mg | ORAL_TABLET | Freq: Every day | ORAL | 1 refills | Status: DC
  Filled 2022-04-03: qty 90, 90d supply, fill #0
  Filled 2022-08-09 – 2022-08-19 (×2): qty 90, 90d supply, fill #1

## 2022-04-03 NOTE — Telephone Encounter (Signed)
Okay for refill?  

## 2022-04-05 DIAGNOSIS — F902 Attention-deficit hyperactivity disorder, combined type: Secondary | ICD-10-CM | POA: Diagnosis not present

## 2022-04-10 DIAGNOSIS — D229 Melanocytic nevi, unspecified: Secondary | ICD-10-CM | POA: Diagnosis not present

## 2022-04-10 DIAGNOSIS — D1801 Hemangioma of skin and subcutaneous tissue: Secondary | ICD-10-CM | POA: Diagnosis not present

## 2022-04-10 DIAGNOSIS — L814 Other melanin hyperpigmentation: Secondary | ICD-10-CM | POA: Diagnosis not present

## 2022-04-10 DIAGNOSIS — L219 Seborrheic dermatitis, unspecified: Secondary | ICD-10-CM | POA: Diagnosis not present

## 2022-04-10 DIAGNOSIS — L578 Other skin changes due to chronic exposure to nonionizing radiation: Secondary | ICD-10-CM | POA: Diagnosis not present

## 2022-04-18 DIAGNOSIS — F902 Attention-deficit hyperactivity disorder, combined type: Secondary | ICD-10-CM | POA: Diagnosis not present

## 2022-04-30 ENCOUNTER — Other Ambulatory Visit (HOSPITAL_COMMUNITY): Payer: Self-pay

## 2022-05-02 ENCOUNTER — Other Ambulatory Visit (HOSPITAL_COMMUNITY): Payer: Self-pay

## 2022-05-02 DIAGNOSIS — F902 Attention-deficit hyperactivity disorder, combined type: Secondary | ICD-10-CM | POA: Diagnosis not present

## 2022-05-06 ENCOUNTER — Other Ambulatory Visit (HOSPITAL_COMMUNITY): Payer: Self-pay

## 2022-05-07 ENCOUNTER — Other Ambulatory Visit (HOSPITAL_COMMUNITY): Payer: Self-pay

## 2022-05-07 ENCOUNTER — Other Ambulatory Visit: Payer: Self-pay

## 2022-05-15 ENCOUNTER — Other Ambulatory Visit: Payer: Self-pay | Admitting: Adult Health

## 2022-05-15 NOTE — Telephone Encounter (Signed)
Okay for refill?  

## 2022-05-16 ENCOUNTER — Other Ambulatory Visit: Payer: Self-pay | Admitting: Adult Health

## 2022-05-17 DIAGNOSIS — F902 Attention-deficit hyperactivity disorder, combined type: Secondary | ICD-10-CM | POA: Diagnosis not present

## 2022-05-17 NOTE — Telephone Encounter (Signed)
Can you please deny Rx

## 2022-05-18 ENCOUNTER — Other Ambulatory Visit (HOSPITAL_COMMUNITY): Payer: Self-pay

## 2022-05-21 ENCOUNTER — Other Ambulatory Visit (HOSPITAL_COMMUNITY): Payer: Self-pay

## 2022-05-30 ENCOUNTER — Other Ambulatory Visit: Payer: Self-pay

## 2022-06-04 ENCOUNTER — Other Ambulatory Visit (HOSPITAL_COMMUNITY): Payer: Self-pay

## 2022-06-05 DIAGNOSIS — F902 Attention-deficit hyperactivity disorder, combined type: Secondary | ICD-10-CM | POA: Diagnosis not present

## 2022-06-07 ENCOUNTER — Other Ambulatory Visit (HOSPITAL_COMMUNITY): Payer: Self-pay

## 2022-06-14 ENCOUNTER — Other Ambulatory Visit (HOSPITAL_COMMUNITY): Payer: Self-pay

## 2022-06-18 ENCOUNTER — Other Ambulatory Visit: Payer: Self-pay | Admitting: Adult Health

## 2022-06-18 DIAGNOSIS — F902 Attention-deficit hyperactivity disorder, combined type: Secondary | ICD-10-CM | POA: Diagnosis not present

## 2022-06-18 DIAGNOSIS — R110 Nausea: Secondary | ICD-10-CM

## 2022-06-19 ENCOUNTER — Other Ambulatory Visit (HOSPITAL_COMMUNITY): Payer: Self-pay

## 2022-06-19 ENCOUNTER — Other Ambulatory Visit: Payer: Self-pay

## 2022-06-20 ENCOUNTER — Other Ambulatory Visit (HOSPITAL_COMMUNITY): Payer: Self-pay

## 2022-06-20 MED ORDER — LISDEXAMFETAMINE DIMESYLATE 40 MG PO CAPS
40.0000 mg | ORAL_CAPSULE | Freq: Every morning | ORAL | 0 refills | Status: DC
  Filled 2022-06-20 – 2022-07-22 (×2): qty 30, 30d supply, fill #0

## 2022-06-20 MED ORDER — LISDEXAMFETAMINE DIMESYLATE 40 MG PO CAPS
40.0000 mg | ORAL_CAPSULE | ORAL | 0 refills | Status: DC
  Filled 2022-06-20: qty 30, 30d supply, fill #0

## 2022-06-20 MED ORDER — ONDANSETRON HCL 4 MG PO TABS
4.0000 mg | ORAL_TABLET | Freq: Three times a day (TID) | ORAL | 2 refills | Status: AC | PRN
  Filled 2022-06-20: qty 20, 7d supply, fill #0
  Filled 2023-03-05: qty 20, 7d supply, fill #1

## 2022-06-20 NOTE — Telephone Encounter (Signed)
Okay for refill?  

## 2022-06-25 ENCOUNTER — Other Ambulatory Visit (HOSPITAL_COMMUNITY): Payer: Self-pay

## 2022-07-03 ENCOUNTER — Other Ambulatory Visit (HOSPITAL_COMMUNITY): Payer: Self-pay

## 2022-07-05 DIAGNOSIS — F902 Attention-deficit hyperactivity disorder, combined type: Secondary | ICD-10-CM | POA: Diagnosis not present

## 2022-07-22 ENCOUNTER — Other Ambulatory Visit (HOSPITAL_COMMUNITY): Payer: Self-pay

## 2022-07-25 DIAGNOSIS — F902 Attention-deficit hyperactivity disorder, combined type: Secondary | ICD-10-CM | POA: Diagnosis not present

## 2022-07-30 ENCOUNTER — Encounter: Payer: Self-pay | Admitting: Adult Health

## 2022-07-31 ENCOUNTER — Other Ambulatory Visit: Payer: Self-pay | Admitting: Adult Health

## 2022-07-31 ENCOUNTER — Other Ambulatory Visit (HOSPITAL_COMMUNITY): Payer: Self-pay

## 2022-07-31 MED ORDER — FLUCONAZOLE 150 MG PO TABS
150.0000 mg | ORAL_TABLET | Freq: Every day | ORAL | 0 refills | Status: DC
  Filled 2022-07-31: qty 3, 3d supply, fill #0

## 2022-07-31 NOTE — Telephone Encounter (Signed)
Please advise 

## 2022-08-07 DIAGNOSIS — F902 Attention-deficit hyperactivity disorder, combined type: Secondary | ICD-10-CM | POA: Diagnosis not present

## 2022-08-09 ENCOUNTER — Other Ambulatory Visit: Payer: Self-pay | Admitting: Adult Health

## 2022-08-09 ENCOUNTER — Other Ambulatory Visit: Payer: Self-pay

## 2022-08-09 NOTE — Telephone Encounter (Signed)
Please advise if ok to send refill 

## 2022-08-16 ENCOUNTER — Other Ambulatory Visit (HOSPITAL_COMMUNITY): Payer: Self-pay

## 2022-08-17 ENCOUNTER — Other Ambulatory Visit (HOSPITAL_COMMUNITY): Payer: Self-pay

## 2022-08-19 ENCOUNTER — Other Ambulatory Visit (HOSPITAL_COMMUNITY): Payer: Self-pay

## 2022-08-22 ENCOUNTER — Encounter: Payer: Self-pay | Admitting: Adult Health

## 2022-08-22 NOTE — Telephone Encounter (Signed)
Please advise per last note 02/2022:  ". Class 1 obesity -Advised that unfortunately Reginal Lutes is still on backorder.  She would not be a candidate for Mounjaro due to being nondiabetic.  That bound is not covered by her insurance yet.  I advised her to send me a message at the beginning of the year to see if anything has changed with [facility] or if her insurance is covering Zepbound "

## 2022-09-11 ENCOUNTER — Other Ambulatory Visit (HOSPITAL_COMMUNITY): Payer: Self-pay

## 2022-09-11 ENCOUNTER — Other Ambulatory Visit: Payer: Self-pay | Admitting: Adult Health

## 2022-09-11 ENCOUNTER — Other Ambulatory Visit: Payer: Self-pay

## 2022-09-11 MED ORDER — LISDEXAMFETAMINE DIMESYLATE 40 MG PO CAPS
40.0000 mg | ORAL_CAPSULE | ORAL | 0 refills | Status: DC
  Filled 2022-09-11 – 2022-10-28 (×2): qty 30, 30d supply, fill #0

## 2022-09-11 MED ORDER — LISDEXAMFETAMINE DIMESYLATE 40 MG PO CAPS
40.0000 mg | ORAL_CAPSULE | Freq: Every morning | ORAL | 0 refills | Status: DC
  Filled 2022-09-11: qty 30, 30d supply, fill #0

## 2022-09-11 MED ORDER — LISDEXAMFETAMINE DIMESYLATE 40 MG PO CAPS
40.0000 mg | ORAL_CAPSULE | ORAL | 0 refills | Status: DC
  Filled 2022-09-11: qty 30, 30d supply, fill #0

## 2022-09-11 NOTE — Telephone Encounter (Signed)
Okay for refill?  

## 2022-09-12 ENCOUNTER — Other Ambulatory Visit (HOSPITAL_COMMUNITY): Payer: Self-pay

## 2022-09-13 ENCOUNTER — Other Ambulatory Visit (HOSPITAL_COMMUNITY): Payer: Self-pay

## 2022-09-13 ENCOUNTER — Other Ambulatory Visit: Payer: Self-pay

## 2022-09-13 DIAGNOSIS — F902 Attention-deficit hyperactivity disorder, combined type: Secondary | ICD-10-CM | POA: Diagnosis not present

## 2022-09-14 ENCOUNTER — Other Ambulatory Visit (HOSPITAL_COMMUNITY): Payer: Self-pay

## 2022-09-27 DIAGNOSIS — F902 Attention-deficit hyperactivity disorder, combined type: Secondary | ICD-10-CM | POA: Diagnosis not present

## 2022-10-01 ENCOUNTER — Ambulatory Visit (INDEPENDENT_AMBULATORY_CARE_PROVIDER_SITE_OTHER): Payer: Commercial Managed Care - PPO | Admitting: Adult Health

## 2022-10-01 ENCOUNTER — Encounter: Payer: Self-pay | Admitting: Adult Health

## 2022-10-01 ENCOUNTER — Other Ambulatory Visit (HOSPITAL_COMMUNITY): Payer: Self-pay

## 2022-10-01 VITALS — BP 120/60 | HR 94 | Temp 98.0°F | Ht 67.0 in | Wt 157.0 lb

## 2022-10-01 DIAGNOSIS — E669 Obesity, unspecified: Secondary | ICD-10-CM

## 2022-10-01 DIAGNOSIS — Z6824 Body mass index (BMI) 24.0-24.9, adult: Secondary | ICD-10-CM | POA: Diagnosis not present

## 2022-10-01 DIAGNOSIS — G44209 Tension-type headache, unspecified, not intractable: Secondary | ICD-10-CM | POA: Diagnosis not present

## 2022-10-01 MED ORDER — CYCLOBENZAPRINE HCL 10 MG PO TABS
10.0000 mg | ORAL_TABLET | Freq: Two times a day (BID) | ORAL | 0 refills | Status: DC | PRN
Start: 2022-10-01 — End: 2022-12-04
  Filled 2022-10-01: qty 15, 5d supply, fill #0

## 2022-10-01 MED ORDER — WEGOVY 0.5 MG/0.5ML ~~LOC~~ SOAJ
0.5000 mg | SUBCUTANEOUS | 0 refills | Status: DC
Start: 2022-10-01 — End: 2022-10-23
  Filled 2022-10-01: qty 2, 28d supply, fill #0

## 2022-10-01 NOTE — Progress Notes (Signed)
Subjective:    Patient ID: Lisa Hines, female    DOB: 1995-12-22, 27 y.o.   MRN: [id number]  Headache    27 year old female who  has a past medical history of Acute appendicitis with localized peritonitis (10/28/2014), ADD (attention deficit disorder), Family history of adverse reaction to anesthesia, History of Clostridium difficile colitis, Infectious mononucleosis (01/25/2014), Irregular menses (11/08/2010), Knee injury (04/19/2011), Myopia, Pes planus, and Tonsillar hypertrophy.  She presents to the office today for multiple issues.   She reports that for that starting about 3-4 months ago she developed a headache once a week and then over the last few weeks she has developed a headache 4-5 days a week. Headaches have been the same. Headaches started in the back of the head ( usually starts on the upper back and radiates upwards towards her temples.. Symptoms last about 5 minutes. Denies vision changes, phono or photophobia, or fevers/chills. OTC medications due not help. Pain is felt as an ache   She would also like to increase Wegovy from 0.25 mg weekly to 0.5 mg weekly. She is tolerating 0.25 mg well. She is eating healthy and exercising   Review of Systems  Neurological:  Positive for headaches.   See HPI   Past Medical History:  Diagnosis Date   Acute appendicitis with localized peritonitis 10/28/2014   ADD (attention deficit disorder)    Family history of adverse reaction to anesthesia    unknown---pt. is adopted   History of Clostridium difficile colitis    Infectious mononucleosis 01/25/2014   no obvious complication except she complains of severe pain in her throat not responsive to ibuprofen discussed  options pain medicines steroid risk benefit    Irregular menses 11/08/2010   Knee injury 04/19/2011   Poss traumatic bursitis tendinitis knee looks stable  .    Myopia    Pes planus    consult [facility]   Tonsillar hypertrophy     Social History   Socioeconomic  History   Marital status: Single    Spouse name: Not on file   Number of children: Not on file   Years of education: Not on file   Highest education level: Bachelor's degree (e.g., BA, AB, BS)  Occupational History   Not on file  Tobacco Use   Smoking status: Never   Smokeless tobacco: Never  Vaping Use   Vaping status: Every Day  Substance and Sexual Activity   Alcohol use: Yes    Alcohol/week: 0.0 standard drinks of alcohol    Comment: ocassionally    Drug use: No   Sexual activity: Not on file  Other Topics Concern   Not on file  Social History Narrative   Caretaker verifies today that the child's current immunizations are up to date.   Child is adopted   Sib adopted   Active at school gymnastics, swimming, tennis and horseback riding seasonal now volleyball   Sleep about 9 hours or so   AL program   [employer]   ib courses   Field hockey diving and lacrosse in past    Intact family      Social Determinants of Health   Financial Resource Strain: Low Risk  (08/22/2021)   Overall Financial Resource Strain (CARDIA)    Difficulty of Paying Living Expenses: Not very hard  Food Insecurity: No Food Insecurity (08/22/2021)   Hunger Vital Sign    Worried About Running Out of Food in the Last Year: Never true  Ran Out of Food in the Last Year: Never true  Transportation Needs: No Transportation Needs (08/22/2021)   PRAPARE - Administrator, Civil Service (Medical): No    Lack of Transportation (Non-Medical): No  Physical Activity: Insufficiently Active (08/22/2021)   Exercise Vital Sign    Days of Exercise per Week: 3 days    Minutes of Exercise per Session: 30 min  Stress: Stress Concern Present (08/22/2021)   [employer] of Occupational Health - Occupational Stress Questionnaire    Feeling of Stress : To some extent  Social Connections: Socially Isolated (08/22/2021)   Social Connection and Isolation Panel [NHANES]    Frequency of Communication with Friends and  Family: More than three times a week    Frequency of Social Gatherings with Friends and Family: More than three times a week    Attends Religious Services: Never    Database administrator or Organizations: No    Attends Engineer, structural: Not on file    Marital Status: Never married  Intimate Partner Violence: Not on file    Past Surgical History:  Procedure Laterality Date   APPENDECTOMY     LAPAROSCOPIC APPENDECTOMY N/A 10/28/2014   Procedure: APPENDECTOMY LAPAROSCOPIC;  Surgeon: Glenna Fellows, MD;  Location: [facility];  Service: General;  Laterality: N/A;   MYRINGOTOMY     TONSILLECTOMY Bilateral 03/19/2017   Procedure: TONSILLECTOMY;  Surgeon: Suzanna Obey, MD;  Location: [facility];  Service: ENT;  Laterality: Bilateral;    Family History  Adopted: Yes  Problem Relation Age of Onset   ADD / ADHD Brother        possible ld    Allergies  Allergen Reactions   Clindamycin Other (See Comments)    cdiff colitis   Amoxicillin-Pot Clavulanate Rash   Penicillin G Rash    Current Outpatient Medications on File Prior to Visit  Medication Sig Dispense Refill   buPROPion (WELLBUTRIN XL) 300 MG 24 hr tablet Take 1 tablet (300 mg total) by mouth daily. 90 tablet 1   [employer] SUNSCREEN SPF 30 EX apply topically to face and body daily for 30     drospirenone-ethinyl estradiol (JASMIEL) 3-0.02 MG tablet TAKE 1 TABLET BY MOUTH EVERY DAY -TAKE CONTINUOUSLY SKIP PLACEBOS 112 tablet 3   EPINEPHrine 0.3 mg/0.3 mL IJ SOAJ injection USE AS DIRECTED AS NEEDED     fluconazole (DIFLUCAN) 150 MG tablet Take 1 tablet (150 mg total) by mouth daily. 3 tablet 0   lamoTRIgine (LAMICTAL) 100 MG tablet Take 1 tablet (100 mg total) by mouth daily. 90 tablet 1   lisdexamfetamine (VYVANSE) 40 MG capsule Take 1 capsule (40 mg total) by mouth every morning. 30 capsule 0   lisdexamfetamine (VYVANSE) 40 MG capsule Take 1 capsule (40 mg total) by mouth every morning. 30 capsule 0    lisdexamfetamine (VYVANSE) 40 MG capsule Take 1 capsule (40 mg total) by mouth every morning. 30 capsule 0   omeprazole (PRILOSEC) 40 MG capsule Take 1 capsule (40 mg total) by mouth daily. 90 capsule 1   ondansetron (ZOFRAN) 4 MG tablet Take 1 tablet by mouth every 8 hours as needed for nausea or vomiting. 20 tablet 2   zolpidem (AMBIEN) 10 MG tablet Take 1 tablet by mouth at bedtime as needed for sleep. 15 tablet 2   No current facility-administered medications on file prior to visit.    BP 120/60   Pulse 94   Temp 98 F (36.7 C) (Oral)  Ht 5\' 7"  (1.702 m)   Wt 157 lb (71.2 kg)   LMP 09/16/2022   SpO2 96%   BMI 24.59 kg/m       Objective:   Physical Exam Vitals reviewed.  Constitutional:      Appearance: Normal appearance.  Musculoskeletal:        General: No swelling or tenderness.     Comments: Tight trapezius and rhomboid muscles bilaterally   Skin:    General: Skin is warm and dry.     Capillary Refill: Capillary refill takes less than 2 seconds.  Neurological:     General: No focal deficit present.     Mental Status: She is alert and oriented to person, place, and time.  Psychiatric:        Mood and Affect: Mood normal.        Behavior: Behavior normal.        Thought Content: Thought content normal.        Judgment: Judgment normal.       Assessment & Plan:  1. Class 1 obesity  - Semaglutide-Weight Management (WEGOVY) 0.5 MG/0.5ML SOAJ; Inject 0.5 mg into the skin once a week.  Dispense: 2 mL; Refill: 0  2. Tension headache - Headache seems to be tension type. Advised conservative measures such as massage, stretching, relaxation, hydration and/or accupuncture. Will prescribe flexeril to help with symptoms  - cyclobenzaprine (FLEXERIL) 10 MG tablet; Take 1 tablet (10 mg total) by mouth 3 times/day as needed-between meals & bedtime for muscle spasms.  Dispense: 15 tablet; Refill: 0  Shirline Frees, NP

## 2022-10-03 ENCOUNTER — Other Ambulatory Visit (HOSPITAL_COMMUNITY): Payer: Self-pay

## 2022-10-16 DIAGNOSIS — F902 Attention-deficit hyperactivity disorder, combined type: Secondary | ICD-10-CM | POA: Diagnosis not present

## 2022-10-18 ENCOUNTER — Other Ambulatory Visit (HOSPITAL_COMMUNITY): Payer: Self-pay

## 2022-10-21 ENCOUNTER — Other Ambulatory Visit (HOSPITAL_COMMUNITY): Payer: Self-pay

## 2022-10-21 DIAGNOSIS — N941 Unspecified dyspareunia: Secondary | ICD-10-CM | POA: Diagnosis not present

## 2022-10-21 DIAGNOSIS — Z113 Encounter for screening for infections with a predominantly sexual mode of transmission: Secondary | ICD-10-CM | POA: Diagnosis not present

## 2022-10-21 DIAGNOSIS — Z01419 Encounter for gynecological examination (general) (routine) without abnormal findings: Secondary | ICD-10-CM | POA: Diagnosis not present

## 2022-10-21 MED ORDER — DROSPIRENONE-ETHINYL ESTRADIOL 3-0.02 MG PO TABS
ORAL_TABLET | ORAL | 3 refills | Status: DC
  Filled 2022-10-21 – 2022-12-04 (×2): qty 112, 84d supply, fill #0
  Filled 2023-03-05: qty 112, 84d supply, fill #1
  Filled 2023-07-09: qty 112, 84d supply, fill #2

## 2022-10-23 ENCOUNTER — Other Ambulatory Visit (HOSPITAL_COMMUNITY): Payer: Self-pay

## 2022-10-23 ENCOUNTER — Encounter: Payer: Self-pay | Admitting: Adult Health

## 2022-10-23 MED ORDER — WEGOVY 1 MG/0.5ML ~~LOC~~ SOAJ
1.0000 mg | SUBCUTANEOUS | 0 refills | Status: DC
  Filled 2022-10-23: qty 2, 28d supply, fill #0

## 2022-10-23 NOTE — Telephone Encounter (Signed)
Please advise 

## 2022-10-28 ENCOUNTER — Other Ambulatory Visit (HOSPITAL_COMMUNITY): Payer: Self-pay

## 2022-10-29 ENCOUNTER — Other Ambulatory Visit: Payer: Self-pay

## 2022-10-31 DIAGNOSIS — F902 Attention-deficit hyperactivity disorder, combined type: Secondary | ICD-10-CM | POA: Diagnosis not present

## 2022-11-05 ENCOUNTER — Other Ambulatory Visit: Payer: Self-pay | Admitting: Adult Health

## 2022-11-05 ENCOUNTER — Other Ambulatory Visit (HOSPITAL_COMMUNITY): Payer: Self-pay

## 2022-11-05 MED ORDER — FLUCONAZOLE 150 MG PO TABS
ORAL_TABLET | ORAL | 0 refills | Status: DC
  Filled 2022-11-05: qty 2, 2d supply, fill #0

## 2022-11-05 MED ORDER — CEPHALEXIN 500 MG PO CAPS
500.0000 mg | ORAL_CAPSULE | Freq: Three times a day (TID) | ORAL | 0 refills | Status: AC
  Filled 2022-11-05: qty 30, 10d supply, fill #0

## 2022-11-13 DIAGNOSIS — F902 Attention-deficit hyperactivity disorder, combined type: Secondary | ICD-10-CM | POA: Diagnosis not present

## 2022-11-27 IMAGING — US US ABDOMEN COMPLETE
1 series · 14 of 25 positions shown · non-contrast
Comparison: CT abdomen pelvis dated 10/28/2014.

CLINICAL DATA: 25-year-old female with generalized abdominal pain.

EXAM:
ABDOMEN ULTRASOUND COMPLETE

[Series 1: us abdomen complete · 0.26mm/px · 112 acquisitions, 14 frames shown]
[im 1/112]
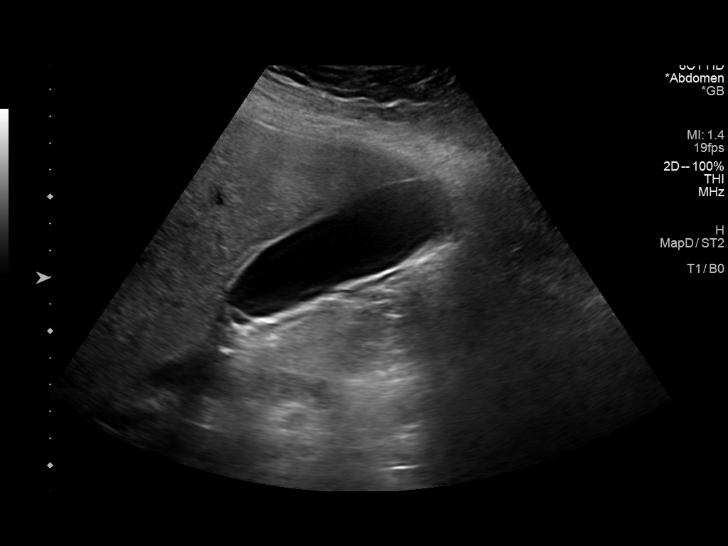
[im 10/112]
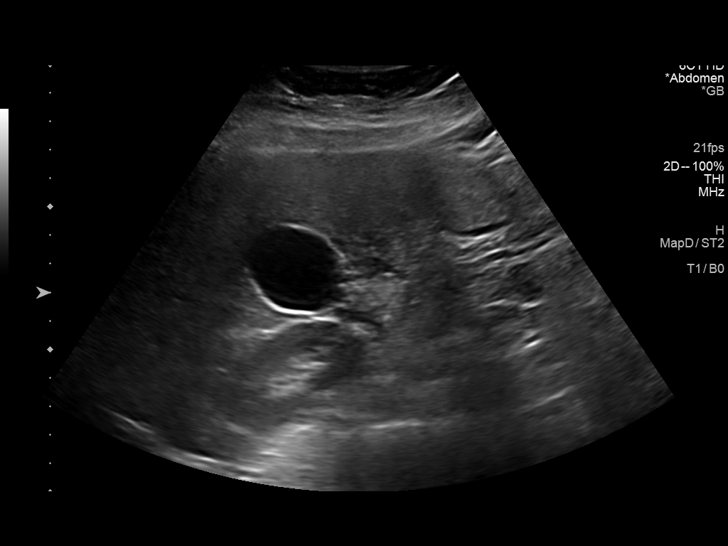
[im 19/112]
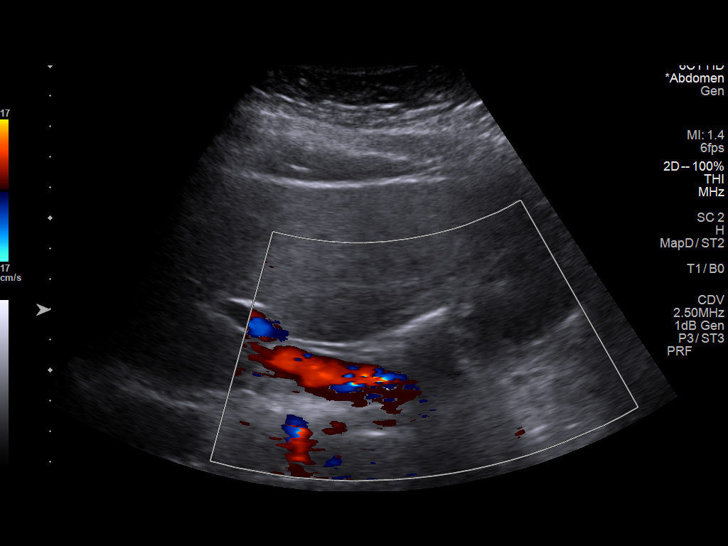
[im 28/112]
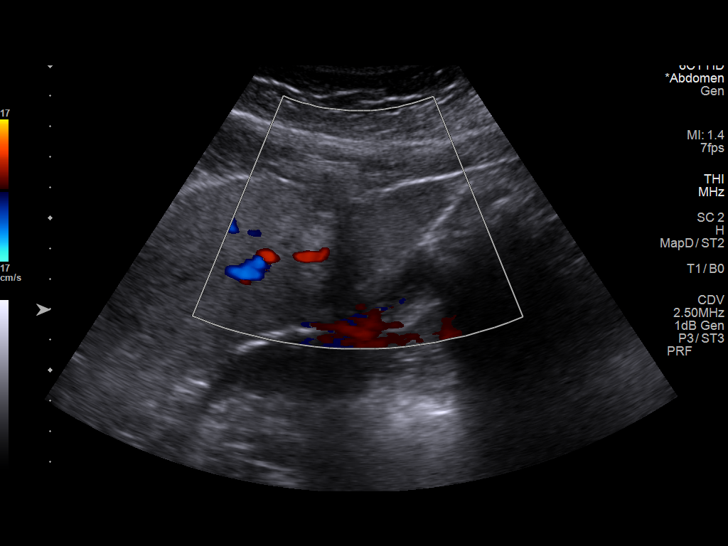
[im 38/112]
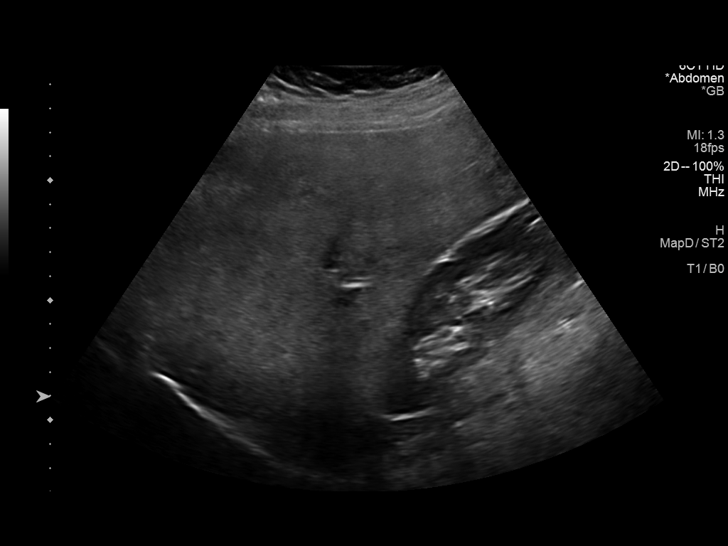
[im 42/112]
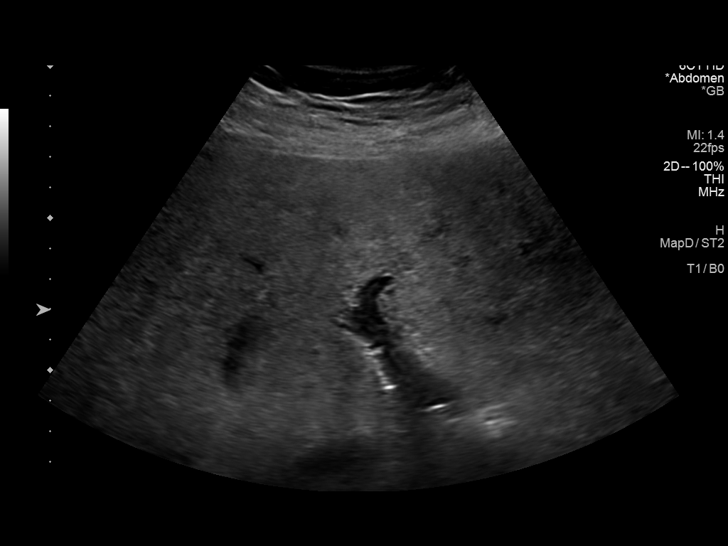
[im 51/112]
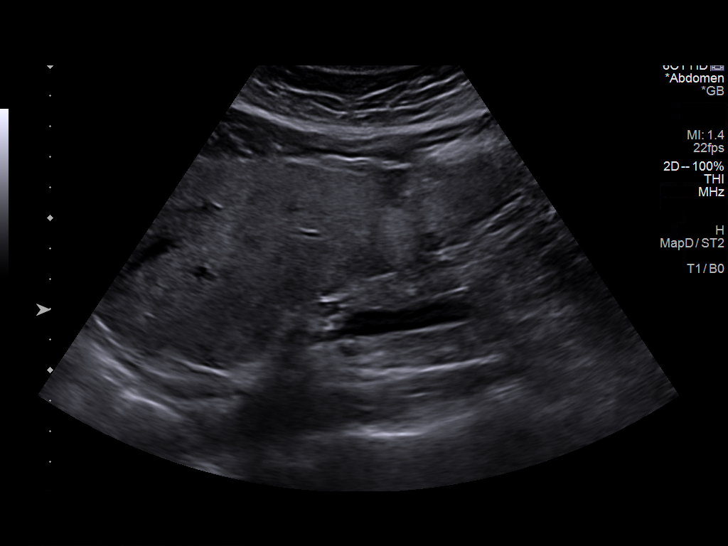
[im 61/112]
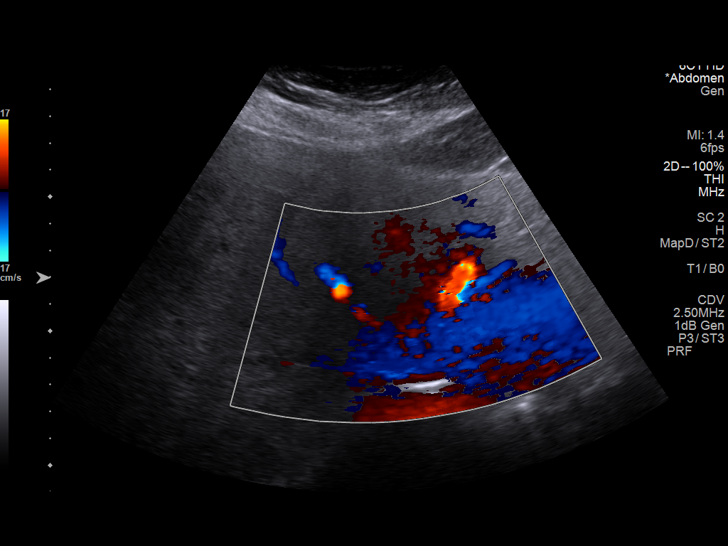
[im 70/112]
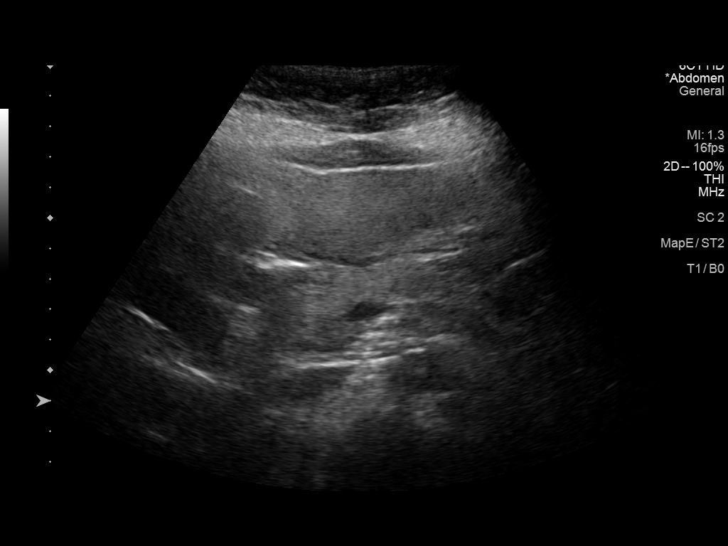
[im 75/112]
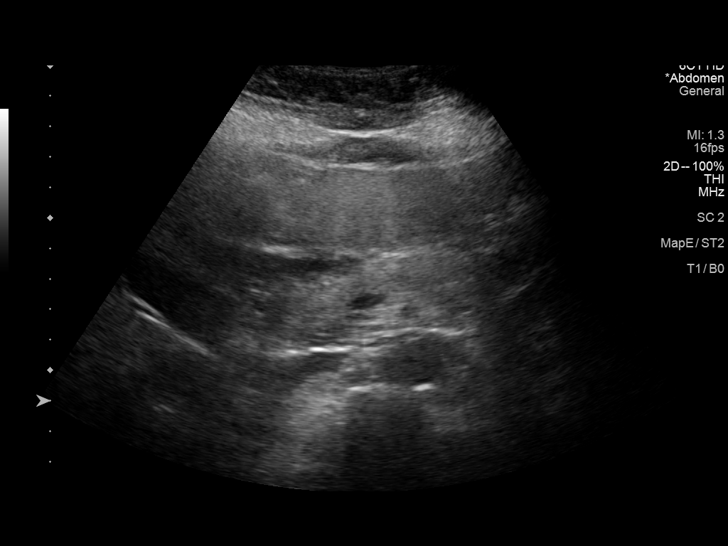
[im 84/112]
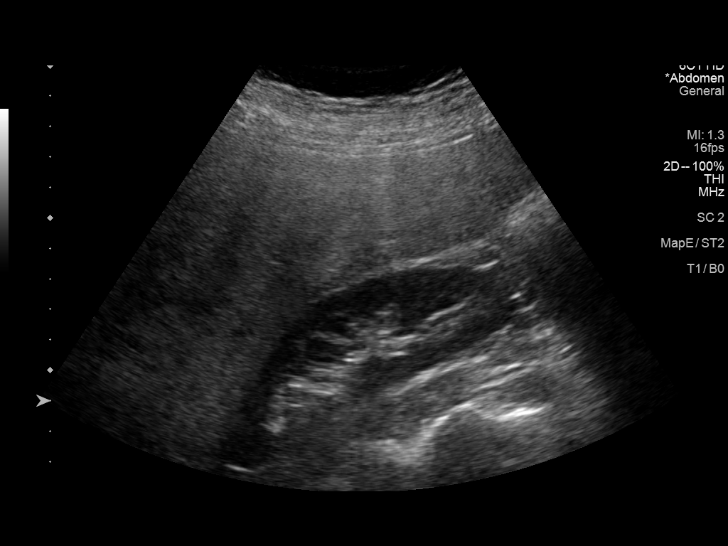
[im 93/112]
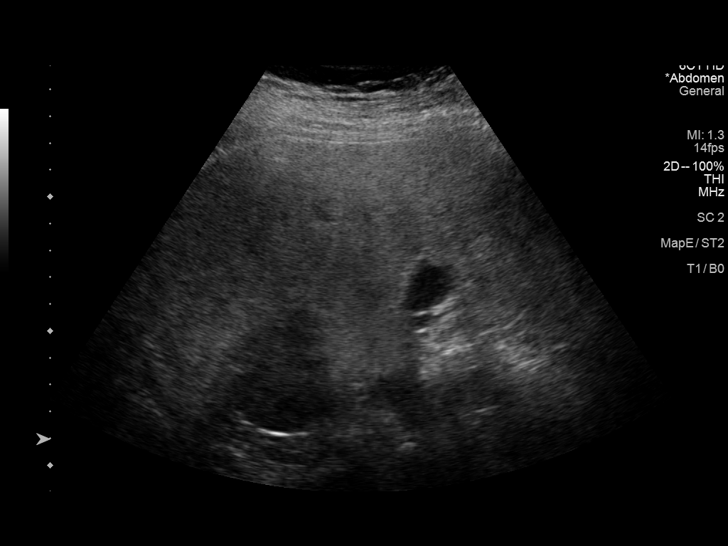
[im 102/112]
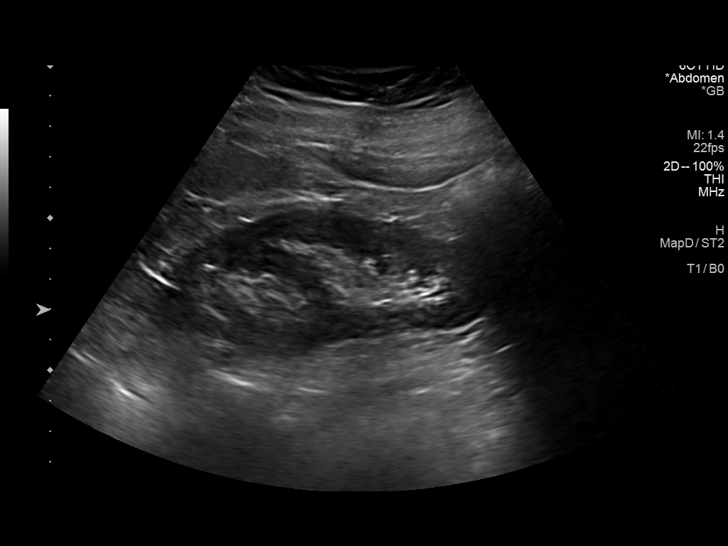
[im 112/112]
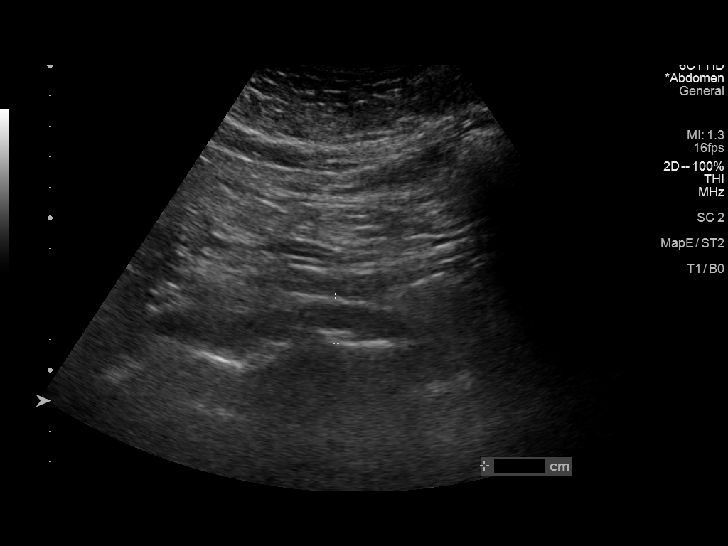

[14 of 25 positions shown; findings below may reference images not displayed]

FINDINGS: Gallbladder: No gallstones or wall thickening visualized. No
sonographic Murphy sign noted by sonographer.

Common bile duct: Diameter: 3 mm

Liver: There is diffuse increased liver echogenicity most commonly
seen in the setting of fatty infiltration. Portal vein is patent on
color Doppler imaging with normal direction of blood flow towards
the liver.

IVC: No abnormality visualized.

Pancreas: Visualized portion unremarkable.

Spleen: Size and appearance within normal limits.

Right Kidney: Length: 10.8 cm. Echogenicity within normal limits. No
mass or hydronephrosis visualized.

Left Kidney: Length: 11.7 cm. Echogenicity within normal limits. No
mass or hydronephrosis visualized.

Abdominal aorta: No aneurysm visualized.

Other findings: None.
IMPRESSION: Fatty liver, otherwise unremarkable abdominal ultrasound.

## 2022-11-28 DIAGNOSIS — F902 Attention-deficit hyperactivity disorder, combined type: Secondary | ICD-10-CM | POA: Diagnosis not present

## 2022-12-04 ENCOUNTER — Other Ambulatory Visit: Payer: Self-pay

## 2022-12-04 ENCOUNTER — Other Ambulatory Visit: Payer: Self-pay | Admitting: Adult Health

## 2022-12-04 ENCOUNTER — Other Ambulatory Visit (HOSPITAL_COMMUNITY): Payer: Self-pay

## 2022-12-04 DIAGNOSIS — F39 Unspecified mood [affective] disorder: Secondary | ICD-10-CM

## 2022-12-04 DIAGNOSIS — G44209 Tension-type headache, unspecified, not intractable: Secondary | ICD-10-CM

## 2022-12-05 ENCOUNTER — Other Ambulatory Visit (HOSPITAL_COMMUNITY): Payer: Self-pay

## 2022-12-05 MED ORDER — LISDEXAMFETAMINE DIMESYLATE 40 MG PO CAPS
40.0000 mg | ORAL_CAPSULE | ORAL | 0 refills | Status: DC
Start: 2022-12-05 — End: 2023-03-05
  Filled 2022-12-05: qty 30, 30d supply, fill #0

## 2022-12-05 MED ORDER — BUPROPION HCL ER (XL) 300 MG PO TB24
300.0000 mg | ORAL_TABLET | Freq: Every day | ORAL | 1 refills | Status: DC
Start: 2022-12-05 — End: 2023-07-09
  Filled 2022-12-05: qty 90, 90d supply, fill #0
  Filled 2023-03-05: qty 90, 90d supply, fill #1

## 2022-12-05 MED ORDER — LISDEXAMFETAMINE DIMESYLATE 40 MG PO CAPS
40.0000 mg | ORAL_CAPSULE | Freq: Every morning | ORAL | 0 refills | Status: DC
  Filled 2022-12-05: qty 30, 30d supply, fill #0

## 2022-12-05 MED ORDER — LISDEXAMFETAMINE DIMESYLATE 40 MG PO CAPS
40.0000 mg | ORAL_CAPSULE | ORAL | 0 refills | Status: DC
  Filled 2022-12-05: qty 30, 30d supply, fill #0

## 2022-12-05 MED ORDER — OMEPRAZOLE 40 MG PO CPDR
40.0000 mg | DELAYED_RELEASE_CAPSULE | Freq: Every day | ORAL | 1 refills | Status: DC
  Filled 2022-12-05: qty 90, 90d supply, fill #0
  Filled 2023-03-05: qty 90, 90d supply, fill #1

## 2022-12-05 MED ORDER — CYCLOBENZAPRINE HCL 10 MG PO TABS
10.0000 mg | ORAL_TABLET | Freq: Two times a day (BID) | ORAL | 0 refills | Status: DC | PRN
  Filled 2022-12-05: qty 15, 5d supply, fill #0

## 2022-12-05 NOTE — Telephone Encounter (Signed)
Okay for refill?  

## 2022-12-13 DIAGNOSIS — F902 Attention-deficit hyperactivity disorder, combined type: Secondary | ICD-10-CM | POA: Diagnosis not present

## 2022-12-23 DIAGNOSIS — F902 Attention-deficit hyperactivity disorder, combined type: Secondary | ICD-10-CM | POA: Diagnosis not present

## 2022-12-25 DIAGNOSIS — N760 Acute vaginitis: Secondary | ICD-10-CM | POA: Diagnosis not present

## 2023-01-02 ENCOUNTER — Ambulatory Visit: Payer: Commercial Managed Care - PPO | Attending: Obstetrics | Admitting: Physical Therapy

## 2023-01-02 ENCOUNTER — Encounter: Payer: Self-pay | Admitting: Physical Therapy

## 2023-01-02 ENCOUNTER — Other Ambulatory Visit: Payer: Self-pay

## 2023-01-02 DIAGNOSIS — M6281 Muscle weakness (generalized): Secondary | ICD-10-CM | POA: Diagnosis not present

## 2023-01-02 DIAGNOSIS — M62838 Other muscle spasm: Secondary | ICD-10-CM

## 2023-01-02 DIAGNOSIS — N941 Unspecified dyspareunia: Secondary | ICD-10-CM | POA: Diagnosis not present

## 2023-01-02 DIAGNOSIS — R279 Unspecified lack of coordination: Secondary | ICD-10-CM | POA: Diagnosis not present

## 2023-01-02 NOTE — Therapy (Signed)
OUTPATIENT PHYSICAL THERAPY FEMALE PELVIC EVALUATION   Patient Name: Lisa Hines MRN: [id number] DOB:14-Mar-1996, 27 y.o., female Today's Date: 01/02/2023  END OF SESSION:  PT End of Session - 01/02/23 1146     Visit Number 1    Date for PT Re-Evaluation 05/05/23    Authorization Type [facility] aetna    PT Start Time 1145    PT Stop Time 1227    PT Time Calculation (min) 42 min             Past Medical History:  Diagnosis Date   Acute appendicitis with localized peritonitis 10/28/2014   ADD (attention deficit disorder)    Family history of adverse reaction to anesthesia    unknown---pt. is adopted   History of Clostridium difficile colitis    Infectious mononucleosis 01/25/2014   no obvious complication except she complains of severe pain in her throat not responsive to ibuprofen discussed  options pain medicines steroid risk benefit    Irregular menses 11/08/2010   Knee injury 04/19/2011   Poss traumatic bursitis tendinitis knee looks stable  .    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   Ingrown toenail 03/26/2019   Left breast lump 08/12/2015   Borderline anemia 09/27/2014   Well adolescent visit 10/30/2011   Acne 10/30/2011   Pes planus 04/19/2011   ADD (attention deficit disorder)    Pes planus    PES PLANUS, 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   OTHER NONSPECIFIC FINDING EXAMINATION OF URINE 03/02/2007    PCP: Shirline Frees,  NP  REFERRING PROVIDER: Marlow Baars, MD  REFERRING DIAG: N94.10 (ICD-10-CM) - Unspecified dyspareunia  THERAPY DIAG:  Other muscle spasm  Muscle weakness (generalized)  Abnormal posture  Unspecified lack of coordination  Rationale for Evaluation and Treatment: Rehabilitation  ONSET DATE: 2017  SUBJECTIVE:                                                                                                                                                                                           SUBJECTIVE STATEMENT: Pt reports pain with vaginal  penetration, during and after intercourse. Also pain with medical exams, tampon use.    PAIN:  Are you having pain? Yes NPRS scale: 9/10 during intercourse, 2-3/10 after Pain location: Internal  Pain type: pressure, achy/sore Pain description: constant   Aggravating factors: any vaginal penetration Relieving factors: removal of penetration  PRECAUTIONS: None  RED FLAGS: None   WEIGHT BEARING RESTRICTIONS: No  FALLS:  Has patient fallen in last 6 months? No  LIVING ENVIRONMENT: Lives with:  roommate Lives in: House/apartment   OCCUPATION: paralegal   PLOF: Independent  PATIENT GOALS: to have less pain  PERTINENT HISTORY:  Appendix removal 2016 Sexual abuse: No  BOWEL MOVEMENT: Pain with bowel movement: No Type of bowel movement:Type (Bristol Stool Scale) 4, Frequency daily, and Strain No Fully empty rectum: Yes:   Leakage: No Pads: No Fiber supplement: No  URINATION: Pain with urination: No Fully empty bladder: Yes:   Stream: Strong Urgency: No Frequency: normal Leakage:  very minimal (drop) with heavy laugher with full bladder Pads: No  INTERCOURSE: Pain with intercourse: Initial Penetration, During Penetration, and After Intercourse Ability to have vaginal penetration:  Yes: but painful, denies dryness on average, uses condoms which helps with lubricant Climax: not painful Marinoff Scale:  0/3  PREGNANCY: Vaginal deliveries 0 Tearing No C-section deliveries 0 Currently pregnant No  PROLAPSE: None   OBJECTIVE:  Note: Objective measures were completed at Evaluation unless otherwise noted.  DIAGNOSTIC FINDINGS:    COGNITION: Overall cognitive status: Within functional limits for tasks assessed     SENSATION: Light touch: Appears intact Proprioception: Appears intact  MUSCLE LENGTH: Bil hamstrings limited by 25%  LUMBAR SPECIAL TESTS:  WFL  FUNCTIONAL TESTS:  WFL  GAIT: WFL  POSTURE: No Significant postural limitations  PELVIC ALIGNMENT:WFL  LUMBARAROM/PROM:  A/PROM A/PROM  eval  Flexion WFL  Extension WFL  Right lateral flexion WFL  Left lateral flexion WFL  Right rotation Limited by 25%  Left rotation Limited by 25%   (Blank rows = not tested)  LOWER EXTREMITY ROM:  WFL  LOWER EXTREMITY MMT:  Bil hips grossly 4+/5; knees 5/5  PALPATION:   General  no TTP or restrictions in abdomen; mild tightness at bil lumbar paraspinals                External Perineal Exam TTP at Rt bulbocavernosus, dryness noted mildly, decreased mobility at clitoral hood                             Internal Pelvic Floor TTP noted in superficial and deep layers bil however more tension and TTP at superficial layer. Trigger points noted at bil bulbocavernosus, very limited mobility at perineal body with TTP.   Patient confirms identification and approves PT to assess internal pelvic floor and treatment Yes No emotional/communication barriers or cognitive limitation. Patient is motivated to learn. Patient understands and agrees with treatment goals and plan. PT explains patient will be examined in standing, sitting, and lying down to see how their muscles and joints work. When they are ready, they will be asked to remove their underwear so PT can examine their perineum. The patient is also given the option of providing their own chaperone as one is not provided in  our facility. The patient also has the right and is explained the right to defer or refuse any part of the evaluation or treatment including the internal exam. With the patient's consent, PT will use one  gloved finger to gently assess the muscles of the pelvic floor, seeing how well it contracts and relaxes and if there is muscle symmetry. After, the patient will get dressed and PT and patient will discuss exam findings and plan of care. PT and patient discuss plan of care, schedule, attendance policy and HEP activities.  PELVIC MMT:   MMT eval  Vaginal 4/5 with cues for mechanics; initially 3/5  Internal Anal Sphincter   External Anal Sphincter   Puborectalis   Diastasis Recti   (Blank rows = not tested)        TONE: Slightly increased   PROLAPSE: Not seen in hooklying   TODAY'S TREATMENT:                                                                                                                              DATE:   01/02/23 EVAL Examination completed, findings reviewed, pt educated on POC, HEP. Pt motivated to participate in PT and agreeable to attempt recommendations.     PATIENT EDUCATION:  Education details: Insurance underwriter Person educated: Patient Education method: Explanation, Demonstration, Tactile cues, Verbal cues, and Handouts Education comprehension: verbalized understanding and returned demonstration  HOME EXERCISE PROGRAM: [id number]  ASSESSMENT:  CLINICAL IMPRESSION: Patient is a 27 y.o. female  who was seen today for physical therapy evaluation and treatment for pain with vaginal penetration with or without intercourse. Pt has had pain with vaginal penetration with tampons, medical exams, or with intercourse since first attempt ~2017. Pt states she is unable to wear tampons comfortably and sometimes will stay sore after intercourse for 1-2 days. Pt found to have mildly decreased flexibility at hips and spine, mild tension in lumbar spine. Patient consented to internal  pelvic floor assessment vaginally this date and found to have TTP noted in superficial and deep layers bil however more tension and TTP at superficial layer. Trigger points noted at bil bulbocavernosus, very limited mobility at perineal body with TTP. Pt educated on HEP to improve pelvic floor mobility and decreased tension. Pt would benefit from additional PT to further address deficits.    OBJECTIVE IMPAIRMENTS: decreased activity tolerance, decreased coordination, decreased mobility, decreased strength, increased muscle spasms, impaired flexibility, improper body mechanics, and pain.   ACTIVITY LIMITATIONS:  intercourse/medical exams  PARTICIPATION LIMITATIONS: interpersonal relationship  PERSONAL FACTORS: Time since onset of injury/illness/exacerbation are also affecting patient's functional outcome.   REHAB POTENTIAL: Good  CLINICAL DECISION MAKING: Stable/uncomplicated  EVALUATION COMPLEXITY: Low   GOALS: Goals reviewed with patient? Yes  SHORT TERM GOALS: Target date: 01/30/23  Pt to be I with HEP.  Baseline: Goal status: INITIAL  2.  Pt will report no more than 6/10 pain due to improvements in posture, strength, and muscle length for improved tolerance to vaginal penetration. Baseline:  Goal status: INITIAL  3.  Pt to be I with relaxation techniques to decreased strain at pelvic floor and decreased pain with vaginal penetration.  Baseline:  Goal status:  INITIAL   LONG TERM GOALS: Target date: 05/05/23  Pt to be I with advanced HEP.  Baseline:  Goal status: INITIAL  2.  Pt will report no more than 1/10 pain due to improvements in posture, strength, and muscle length for improved tolerance to vaginal penetration. Baseline:  Goal status: INITIAL  3.  Pt to demonstrate full ROM with bil hips and trunk for decreased strain at pelvic floor.  Baseline:  Goal status: INITIAL  4.  Pt to report no leakage with strong stress of laughter for improved QOL.  Baseline:   Goal status: INITIAL  5.  Pt to tolerate vaginal penetration with dilator size 6 or equivalent for improved tolerance to vaginal medical exams.  Baseline:  Goal status: INITIAL    PLAN:  PT FREQUENCY: 1x/week  PT DURATION:  8 sessions  PLANNED INTERVENTIONS: 97110-Therapeutic exercises, 97530- Therapeutic activity, 97112- Neuromuscular re-education, 97535- Self Care, 69629- Manual therapy, Patient/Family education, Taping, Dry Needling, Joint mobilization, Spinal mobilization, Scar mobilization, Cryotherapy, Moist heat, and Biofeedback  PLAN FOR NEXT SESSION: internal as needed and pt consents, relaxation techniques, pelvic wand/dilator?, moisturizers/lubricants, hip and trunk stretching, intercourse handout   Otelia Sergeant, PT, DPT 01/02/2409:53 AM

## 2023-01-06 ENCOUNTER — Telehealth: Payer: Self-pay | Admitting: Adult Health

## 2023-01-06 NOTE — Telephone Encounter (Signed)
Ok for labs? 

## 2023-01-06 NOTE — Telephone Encounter (Signed)
Ok to schedule pt for a CPE before Jan. We are booked out until January.

## 2023-01-06 NOTE — Telephone Encounter (Signed)
Requested labs to test thyroid, other basic bloodwork

## 2023-01-08 NOTE — Telephone Encounter (Signed)
Pt has been scheduled.  °

## 2023-01-09 ENCOUNTER — Ambulatory Visit: Payer: Commercial Managed Care - PPO | Admitting: Physical Therapy

## 2023-01-09 DIAGNOSIS — N941 Unspecified dyspareunia: Secondary | ICD-10-CM | POA: Diagnosis not present

## 2023-01-09 DIAGNOSIS — R279 Unspecified lack of coordination: Secondary | ICD-10-CM

## 2023-01-09 DIAGNOSIS — M62838 Other muscle spasm: Secondary | ICD-10-CM | POA: Diagnosis not present

## 2023-01-09 DIAGNOSIS — M6281 Muscle weakness (generalized): Secondary | ICD-10-CM | POA: Diagnosis not present

## 2023-01-09 NOTE — Therapy (Signed)
OUTPATIENT PHYSICAL THERAPY FEMALE PELVIC TREATMENT   Patient Name: Lisa Hines MRN: [id number] DOB:Jun 02, 1995, 27 y.o., female Today's Date: 01/09/2023  END OF SESSION:  PT End of Session - 01/09/23 1149     Visit Number 2    Date for PT Re-Evaluation 05/05/23    Authorization Type [facility] aetna    PT Start Time 1146    PT Stop Time 1229    PT Time Calculation (min) 43 min             Past Medical History:  Diagnosis Date   Acute appendicitis with localized peritonitis 10/28/2014   ADD (attention deficit disorder)    Family history of adverse reaction to anesthesia    unknown---pt. is adopted   History of Clostridium difficile colitis    Infectious mononucleosis 01/25/2014   no obvious complication except she complains of severe pain in her throat not responsive to ibuprofen discussed  options pain medicines steroid risk benefit    Irregular menses 11/08/2010   Knee injury 04/19/2011   Poss traumatic bursitis tendinitis knee looks stable  .    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   Ingrown toenail 03/26/2019   Left breast lump 08/12/2015   Borderline anemia 09/27/2014   Well adolescent visit 10/30/2011   Acne 10/30/2011   Pes planus 04/19/2011   ADD (attention deficit disorder)    Pes planus    PES PLANUS, 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   OTHER NONSPECIFIC FINDING EXAMINATION OF URINE 03/02/2007    PCP: Shirline Frees,  NP  REFERRING PROVIDER: Marlow Baars, MD  REFERRING DIAG: N94.10 (ICD-10-CM) - Unspecified dyspareunia  THERAPY DIAG:  Other muscle spasm  Muscle weakness (generalized)  Unspecified lack of coordination  Rationale for Evaluation and Treatment: Rehabilitation  ONSET DATE: 2017  SUBJECTIVE:                                                                                                                                                                                           SUBJECTIVE STATEMENT: Reports similar symptoms to eval, hasn't been able  to do HEP yet.   PAIN:  Are you having pain? Yes NPRS scale: 9/10 during intercourse, 2-3/10 after Pain location: Internal  Pain type: pressure, achy/sore Pain description: constant   Aggravating factors: any vaginal penetration Relieving factors: removal of penetration  PRECAUTIONS: None  RED FLAGS: None   WEIGHT BEARING RESTRICTIONS: No  FALLS:  Has patient fallen in last 6 months? No  LIVING ENVIRONMENT: Lives with:  roommate Lives in: House/apartment   OCCUPATION: paralegal   PLOF: Independent  PATIENT GOALS: to have less pain  PERTINENT HISTORY:  Appendix removal 2016 Sexual abuse: No  BOWEL MOVEMENT: Pain with bowel movement: No Type of bowel movement:Type (Bristol Stool Scale) 4, Frequency daily, and Strain No Fully empty rectum: Yes:   Leakage: No Pads: No Fiber supplement: No  URINATION: Pain with urination: No Fully empty bladder: Yes:   Stream: Strong Urgency: No Frequency: normal Leakage:  very minimal (drop) with heavy laugher with full bladder Pads: No  INTERCOURSE: Pain with intercourse: Initial Penetration, During Penetration, and After Intercourse Ability to have vaginal penetration:  Yes: but painful, denies dryness on average, uses condoms which helps with lubricant Climax: not painful Marinoff Scale: 0/3  PREGNANCY: Vaginal deliveries 0 Tearing No C-section deliveries  0 Currently pregnant No  PROLAPSE: None   OBJECTIVE:  Note: Objective measures were completed at Evaluation unless otherwise noted.  DIAGNOSTIC FINDINGS:    COGNITION: Overall cognitive status: Within functional limits for tasks assessed     SENSATION: Light touch: Appears intact Proprioception: Appears intact  MUSCLE LENGTH: Bil hamstrings limited by 25%  LUMBAR SPECIAL TESTS:  WFL  FUNCTIONAL TESTS:  WFL  GAIT: WFL  POSTURE: No Significant postural limitations  PELVIC ALIGNMENT:WFL  LUMBARAROM/PROM:  A/PROM A/PROM  eval  Flexion WFL  Extension WFL  Right lateral flexion WFL  Left lateral flexion WFL  Right rotation Limited by 25%  Left rotation Limited by 25%   (Blank rows = not tested)  LOWER EXTREMITY ROM:  WFL  LOWER EXTREMITY MMT:  Bil hips grossly 4+/5; knees 5/5  PALPATION:   General  no TTP or restrictions in abdomen; mild tightness at bil lumbar paraspinals                External Perineal Exam TTP at Rt bulbocavernosus, dryness noted mildly, decreased mobility at clitoral hood                             Internal Pelvic Floor TTP noted in superficial and deep layers bil however more tension and TTP at superficial layer. Trigger points noted at bil bulbocavernosus, very limited mobility at perineal body with TTP.   Patient confirms identification and approves PT to assess internal pelvic floor and treatment Yes No emotional/communication barriers or cognitive limitation. Patient is motivated to learn. Patient understands and agrees with treatment goals and plan. PT explains patient will be examined in standing, sitting, and lying down to see how their muscles and joints work. When they are ready, they will be asked to remove their underwear so PT can examine their perineum. The patient is also given the option of providing their own chaperone as one is not provided in our facility. The patient also has the right and is explained the right to  defer or refuse any part of the evaluation or treatment including the internal exam. With the patient's consent, PT will use one gloved finger to gently assess the muscles of the  pelvic floor, seeing how well it contracts and relaxes and if there is muscle symmetry. After, the patient will get dressed and PT and patient will discuss exam findings and plan of care. PT and patient discuss plan of care, schedule, attendance policy and HEP activities.  PELVIC MMT:   MMT eval  Vaginal 4/5 with cues for mechanics; initially 3/5  Internal Anal Sphincter   External Anal Sphincter   Puborectalis   Diastasis Recti   (Blank rows = not tested)        TONE: Slightly increased   PROLAPSE: Not seen in hooklying   TODAY'S TREATMENT:                                                                                                                              DATE:   01/02/23 EVAL Examination completed, findings reviewed, pt educated on POC, HEP. Pt motivated to participate in PT and agreeable to attempt recommendations.    01/09/23: Patient consented to internal pelvic floor treatment vaginally this date and found to have continued tension bil and trigger points throughout. Pt tolerated gentle stretching at bil bulbocavernosus without increased pain however with deeper palpation of Rt side pt demonstrated increased tension at gluteals and Rt hip with IR noted. Pt benefited from verbal cues for relaxation with moderated improvement, greatest improvement with this noted with top>down relaxation technique and cues for release of bil ischial tuberosities. However pt does demonstrate consistent return of clenching and requires cues throughout to decrease. Unable to fully release deeper layer tension today but superficial had fair release noted and tolerated palpation without pain here post treatment.   Seated foam roller x15 Rt/Lt Happy baby 2x30s Butterfly stretch 2x30s  PATIENT EDUCATION:  Education details:  Insurance underwriter Person educated: Patient Education method: Explanation, Demonstration, Tactile cues, Verbal cues, and Handouts Education comprehension: verbalized understanding and returned demonstration  HOME EXERCISE PROGRAM: [id number]  ASSESSMENT:  CLINICAL IMPRESSION: Patient presents for treatment, session focused on relaxation technique and manual work internally with pt consent for decreased tissue tension. Pt tolerated well with max, consistent cues for technique and breathing for relaxation. Pt continues to have tension throughout pelvic floor and would benefit from additional treatment for this. Pt would benefit from additional PT to further address deficits.    OBJECTIVE IMPAIRMENTS: decreased activity tolerance, decreased coordination, decreased mobility, decreased strength, increased muscle spasms, impaired flexibility, improper body mechanics, and pain.   ACTIVITY LIMITATIONS:  intercourse/medical exams  PARTICIPATION LIMITATIONS: interpersonal relationship  PERSONAL FACTORS: Time since onset of injury/illness/exacerbation are also affecting patient's functional outcome.   REHAB POTENTIAL: Good  CLINICAL DECISION MAKING: Stable/uncomplicated  EVALUATION COMPLEXITY: Low   GOALS: Goals reviewed with patient? Yes  SHORT TERM GOALS: Target date: 01/30/23  Pt to be I with HEP.  Baseline: Goal status: INITIAL  2.  Pt will report no more than 6/10 pain due to improvements in posture, strength, and muscle length for improved tolerance to vaginal penetration.  Baseline:  Goal status: INITIAL  3.  Pt to be I with relaxation techniques to decreased strain at pelvic floor and decreased pain with vaginal penetration.  Baseline:  Goal status: INITIAL   LONG TERM GOALS: Target date: 05/05/23  Pt to be I with advanced HEP.  Baseline:  Goal status: INITIAL  2.  Pt will report no more than 1/10 pain due to improvements in posture, strength, and muscle length for improved  tolerance to vaginal penetration. Baseline:  Goal status: INITIAL  3.  Pt to demonstrate full ROM with bil hips and trunk for decreased strain at pelvic floor.  Baseline:  Goal status: INITIAL  4.  Pt to report no leakage with strong stress of laughter for improved QOL.  Baseline:  Goal status: INITIAL  5.  Pt to tolerate vaginal penetration with dilator size 6 or equivalent for improved tolerance to vaginal medical exams.  Baseline:  Goal status: INITIAL    PLAN:  PT FREQUENCY: 1x/week  PT DURATION:  8 sessions  PLANNED INTERVENTIONS: 97110-Therapeutic exercises, 97530- Therapeutic activity, 97112- Neuromuscular re-education, 97535- Self Care, 16109- Manual therapy, Patient/Family education, Taping, Dry Needling, Joint mobilization, Spinal mobilization, Scar mobilization, Cryotherapy, Moist heat, and Biofeedback  PLAN FOR NEXT SESSION: internal as needed and pt consents, relaxation techniques, pelvic wand/dilator?, moisturizers/lubricants, hip and trunk stretching, intercourse handout   Otelia Sergeant, PT, DPT 01/09/2411:34 PM

## 2023-01-14 DIAGNOSIS — F902 Attention-deficit hyperactivity disorder, combined type: Secondary | ICD-10-CM | POA: Diagnosis not present

## 2023-01-15 ENCOUNTER — Other Ambulatory Visit (HOSPITAL_COMMUNITY): Payer: Self-pay

## 2023-01-15 ENCOUNTER — Ambulatory Visit: Payer: Commercial Managed Care - PPO | Admitting: Adult Health

## 2023-01-15 VITALS — BP 100/70 | Temp 98.3°F | Ht 67.0 in | Wt 164.0 lb

## 2023-01-15 DIAGNOSIS — K219 Gastro-esophageal reflux disease without esophagitis: Secondary | ICD-10-CM

## 2023-01-15 DIAGNOSIS — F4323 Adjustment disorder with mixed anxiety and depressed mood: Secondary | ICD-10-CM | POA: Diagnosis not present

## 2023-01-15 DIAGNOSIS — G479 Sleep disorder, unspecified: Secondary | ICD-10-CM

## 2023-01-15 DIAGNOSIS — F909 Attention-deficit hyperactivity disorder, unspecified type: Secondary | ICD-10-CM

## 2023-01-15 DIAGNOSIS — Z0000 Encounter for general adult medical examination without abnormal findings: Secondary | ICD-10-CM | POA: Diagnosis not present

## 2023-01-15 LAB — CBC WITH DIFFERENTIAL/PLATELET
Basophils Absolute: 0.1 10*3/uL (ref 0.0–0.1)
Basophils Relative: 0.7 % (ref 0.0–3.0)
Eosinophils Absolute: 0.4 10*3/uL (ref 0.0–0.7)
Eosinophils Relative: 5.3 % — ABNORMAL HIGH (ref 0.0–5.0)
HCT: 40.4 % (ref 36.0–46.0)
Hemoglobin: 13.2 g/dL (ref 12.0–15.0)
Lymphocytes Relative: 38.3 % (ref 12.0–46.0)
Lymphs Abs: 3 10*3/uL (ref 0.7–4.0)
MCHC: 32.6 g/dL (ref 30.0–36.0)
MCV: 86.6 fL (ref 78.0–100.0)
Monocytes Absolute: 0.6 10*3/uL (ref 0.1–1.0)
Monocytes Relative: 7.4 % (ref 3.0–12.0)
Neutro Abs: 3.8 10*3/uL (ref 1.4–7.7)
Neutrophils Relative %: 48.3 % (ref 43.0–77.0)
Platelets: 398 10*3/uL (ref 150.0–400.0)
RBC: 4.67 Mil/uL (ref 3.87–5.11)
RDW: 14.5 % (ref 11.5–15.5)
WBC: 7.9 10*3/uL (ref 4.0–10.5)

## 2023-01-15 LAB — LIPID PANEL
Cholesterol: 180 mg/dL (ref 0–200)
HDL: 55.5 mg/dL (ref >39.00)
LDL Cholesterol: 57 mg/dL (ref 0–99)
NonHDL: 124.74
Total CHOL/HDL Ratio: 3
Triglycerides: 337 mg/dL — ABNORMAL HIGH (ref 0.0–149.0)
VLDL: 67.4 mg/dL — ABNORMAL HIGH (ref 0.0–40.0)

## 2023-01-15 LAB — VITAMIN D 25 HYDROXY (VIT D DEFICIENCY, FRACTURES): VITD: 36.35 ng/mL (ref 30.00–100.00)

## 2023-01-15 LAB — COMPREHENSIVE METABOLIC PANEL
ALT: 12 U/L (ref 0–35)
AST: 15 U/L (ref 0–37)
Albumin: 3.8 g/dL (ref 3.5–5.2)
Alkaline Phosphatase: 70 U/L (ref 39–117)
BUN: 8 mg/dL (ref 6–23)
CO2: 25 meq/L (ref 19–32)
Calcium: 9.4 mg/dL (ref 8.4–10.5)
Chloride: 104 meq/L (ref 96–112)
Creatinine, Ser: 0.76 mg/dL (ref 0.40–1.20)
GFR: 108.75 mL/min (ref >60.00)
Glucose, Bld: 90 mg/dL (ref 70–99)
Potassium: 3.8 meq/L (ref 3.5–5.1)
Sodium: 137 meq/L (ref 135–145)
Total Bilirubin: 0.2 mg/dL (ref 0.2–1.2)
Total Protein: 6.8 g/dL (ref 6.0–8.3)

## 2023-01-15 LAB — TSH: TSH: 2.98 u[IU]/mL (ref 0.35–5.50)

## 2023-01-15 MED ORDER — LAMOTRIGINE 100 MG PO TABS
100.0000 mg | ORAL_TABLET | Freq: Every day | ORAL | 1 refills | Status: DC
  Filled 2023-01-15: qty 90, 90d supply, fill #0
  Filled 2023-03-05 – 2023-04-24 (×2): qty 90, 90d supply, fill #1

## 2023-01-15 NOTE — Progress Notes (Signed)
Subjective:    Patient ID: Lisa Hines, female    DOB: 1995/04/18, 27 y.o.   MRN: [id number]  HPI Patient presents for yearly preventative medicine examination. She is a pleasant 27 year old female who  has a past medical history of Acute appendicitis with localized peritonitis (10/28/2014), ADD (attention deficit disorder), Family history of adverse reaction to anesthesia, History of Clostridium difficile colitis, Infectious mononucleosis (01/25/2014), Irregular menses (11/08/2010), Knee injury (04/19/2011), Myopia, Pes planus, and Tonsillar hypertrophy.  ADHD -Takes Vyvance 40 mg daily. She feels fatigued a lot of the time. She is unsure if this is due to her thyroid levels, or ADHD.   GERD-takes omeprazole 20 mg as needed  Adjustment disorder with mixed anxiety and depression-currently prescribed Wellbutrin 350mg  daily as well as Lamictal 100 mg.    Sleep disorder-uses Melatonin and CBD.    All immunizations and health maintenance protocols were reviewed with the patient and needed orders were placed.  Appropriate screening laboratory values were ordered for the patient including screening of hyperlipidemia, renal function and hepatic function.   Medication reconciliation,  past medical history, social history, problem list and allergies were reviewed in detail with the patient  Goals were established with regard to weight loss, exercise, and  diet in compliance with medications  Wt Readings from Last 3 Encounters:  01/15/23 164 lb (74.4 kg)  10/01/22 157 lb (71.2 kg)  03/05/22 159 lb (72.1 kg)    Review of Systems  Constitutional:  Positive for fatigue.  HENT: Negative.    Eyes: Negative.   Respiratory: Negative.    Cardiovascular: Negative.   Gastrointestinal: Negative.   Endocrine: Negative.   Genitourinary: Negative.   Musculoskeletal: Negative.   Skin: Negative.   Allergic/Immunologic: Negative.   Neurological: Negative.   Hematological: Negative.    Psychiatric/Behavioral: Negative.     Past Medical History:  Diagnosis Date   Acute appendicitis with localized peritonitis 10/28/2014   ADD (attention deficit disorder)    Family history of adverse reaction to anesthesia    unknown---pt. is adopted   History of Clostridium difficile colitis    Infectious mononucleosis 01/25/2014   no obvious complication except she complains of severe pain in her throat not responsive to ibuprofen discussed  options pain medicines steroid risk benefit    Irregular menses 11/08/2010   Knee injury 04/19/2011   Poss traumatic bursitis tendinitis knee looks stable  .    Myopia    Pes planus    consult [facility]   Tonsillar hypertrophy     Social History   Socioeconomic History   Marital status: Single    Spouse name: Not on file   Number of children: Not on file   Years of education: Not on file   Highest education level: Bachelor's degree (e.g., BA, AB, BS)  Occupational History   Not on file  Tobacco Use   Smoking status: Never   Smokeless tobacco: Never  Vaping Use   Vaping status: Every Day  Substance and Sexual Activity   Alcohol use: Yes    Alcohol/week: 0.0 standard drinks of alcohol    Comment: ocassionally    Drug use: No   Sexual activity: Not on file  Other Topics Concern   Not on file  Social History Narrative   Caretaker verifies today that the child's current immunizations are up to date.   Child is adopted   Sib adopted   Active at school gymnastics, swimming, tennis and horseback riding seasonal now volleyball  Sleep about 9 hours or so   AL program   [employer]   ib courses   Field hockey diving and lacrosse in past    Intact family      Social Determinants of Health   Financial Resource Strain: Low Risk  (08/22/2021)   Overall Financial Resource Strain (CARDIA)    Difficulty of Paying Living Expenses: Not very hard  Food Insecurity: No Food Insecurity (08/22/2021)   Hunger Vital Sign    Worried About Running Out of  Food in the Last Year: Never true    Ran Out of Food in the Last Year: Never true  Transportation Needs: No Transportation Needs (08/22/2021)   PRAPARE - Administrator, Civil Service (Medical): No    Lack of Transportation (Non-Medical): No  Physical Activity: Insufficiently Active (08/22/2021)   Exercise Vital Sign    Days of Exercise per Week: 3 days    Minutes of Exercise per Session: 30 min  Stress: Stress Concern Present (08/22/2021)   [employer] of Occupational Health - Occupational Stress Questionnaire    Feeling of Stress : To some extent  Social Connections: Socially Isolated (08/22/2021)   Social Connection and Isolation Panel [NHANES]    Frequency of Communication with Friends and Family: More than three times a week    Frequency of Social Gatherings with Friends and Family: More than three times a week    Attends Religious Services: Never    Database administrator or Organizations: No    Attends Engineer, structural: Not on file    Marital Status: Never married  Intimate Partner Violence: Not on file    Past Surgical History:  Procedure Laterality Date   APPENDECTOMY     LAPAROSCOPIC APPENDECTOMY N/A 10/28/2014   Procedure: APPENDECTOMY LAPAROSCOPIC;  Surgeon: Glenna Fellows, MD;  Location: [facility];  Service: General;  Laterality: N/A;   MYRINGOTOMY     TONSILLECTOMY Bilateral 03/19/2017   Procedure: TONSILLECTOMY;  Surgeon: Suzanna Obey, MD;  Location: [facility];  Service: ENT;  Laterality: Bilateral;    Family History  Adopted: Yes  Problem Relation Age of Onset   ADD / ADHD Brother        possible ld    Allergies  Allergen Reactions   Clindamycin Other (See Comments)    cdiff colitis   Amoxicillin-Pot Clavulanate Rash   Penicillin G Rash    Current Outpatient Medications on File Prior to Visit  Medication Sig Dispense Refill   buPROPion (WELLBUTRIN XL) 300 MG 24 hr tablet Take 1 tablet (300 mg total) by mouth daily.  90 tablet 1   cyclobenzaprine (FLEXERIL) 10 MG tablet Take 1 tablet (10 mg) by mouth 3 times per day as needed-between meals & bedtime for muscle spasms. 15 tablet 0   drospirenone-ethinyl estradiol (JASMIEL) 3-0.02 MG tablet TAKE 1 TABLET BY MOUTH EVERY DAY -TAKE CONTINUOUSLY SKIP PLACEBOS 112 tablet 3   fluconazole (DIFLUCAN) 150 MG tablet Take one tablet today and if needed take second tablet in 3 days 2 tablet 0   lamoTRIgine (LAMICTAL) 100 MG tablet Take 1 tablet (100 mg total) by mouth daily. 90 tablet 1   lisdexamfetamine (VYVANSE) 40 MG capsule Take 1 capsule (40 mg total) by mouth every morning. 30 capsule 0   lisdexamfetamine (VYVANSE) 40 MG capsule Take 1 capsule (40 mg total) by mouth every morning. 30 capsule 0   lisdexamfetamine (VYVANSE) 40 MG capsule Take 1 capsule (40 mg total) by mouth  every morning. 30 capsule 0   omeprazole (PRILOSEC) 40 MG capsule Take 1 capsule (40 mg total) by mouth daily. 90 capsule 1   ondansetron (ZOFRAN) 4 MG tablet Take 1 tablet by mouth every 8 hours as needed for nausea or vomiting. 20 tablet 2   Semaglutide-Weight Management (WEGOVY) 1 MG/0.5ML SOAJ Inject 1 mg into the skin once a week. 2 mL 0   zolpidem (AMBIEN) 10 MG tablet Take 1 tablet by mouth at bedtime as needed for sleep. 15 tablet 2   No current facility-administered medications on file prior to visit.    Temp 98.3 F (36.8 C) (Oral)   Ht 5\' 7"  (1.702 m)   Wt 164 lb (74.4 kg)   BMI 25.69 kg/m       Objective:   Physical Exam Vitals and nursing note reviewed.  Constitutional:      General: She is not in acute distress.    Appearance: Normal appearance. She is not ill-appearing.  HENT:     Head: Normocephalic and atraumatic.     Right Ear: Tympanic membrane, ear canal and external ear normal. There is no impacted cerumen.     Left Ear: Tympanic membrane, ear canal and external ear normal. There is no impacted cerumen.     Nose: Nose normal. No congestion or rhinorrhea.      Mouth/Throat:     Mouth: Mucous membranes are moist.     Pharynx: Oropharynx is clear.  Eyes:     Extraocular Movements: Extraocular movements intact.     Conjunctiva/sclera: Conjunctivae normal.     Pupils: Pupils are equal, round, and reactive to light.  Neck:     Vascular: No carotid bruit.  Cardiovascular:     Rate and Rhythm: Normal rate and regular rhythm.     Pulses: Normal pulses.     Heart sounds: No murmur heard.    No friction rub. No gallop.  Pulmonary:     Effort: Pulmonary effort is normal.     Breath sounds: Normal breath sounds.  Abdominal:     General: Abdomen is flat. Bowel sounds are normal. There is no distension.     Palpations: Abdomen is soft. There is no mass.     Tenderness: There is no abdominal tenderness. There is no guarding or rebound.     Hernia: No hernia is present.  Musculoskeletal:        General: Normal range of motion.     Cervical back: Normal range of motion and neck supple.  Lymphadenopathy:     Cervical: No cervical adenopathy.  Skin:    General: Skin is warm and dry.     Capillary Refill: Capillary refill takes less than 2 seconds.  Neurological:     General: No focal deficit present.     Mental Status: She is alert and oriented to person, place, and time.  Psychiatric:        Mood and Affect: Mood normal.        Behavior: Behavior normal.        Thought Content: Thought content normal.        Judgment: Judgment normal.        Assessment & Plan:  1. Routine general medical examination at a health care facility Today patient counseled on age appropriate routine health concerns for screening and prevention, each reviewed and up to date or declined. Immunizations reviewed and up to date or declined. Labs ordered and reviewed. Risk factors for depression reviewed and negative. Hearing  function and visual acuity are intact. ADLs screened and addressed as needed. Functional ability and level of safety reviewed and appropriate. Education,  counseling and referrals performed based on assessed risks today. Patient provided with a copy of personalized plan for preventive services. - Follow up in one year   2. Attention deficit hyperactivity disorder (ADHD), unspecified ADHD type - Consider increase in Vyvance  - CBC with Differential/Platelet; Future - Comprehensive metabolic panel; Future - Lipid panel; Future - TSH; Future - VITAMIN D 25 Hydroxy (Vit-D Deficiency, Fractures); Future  3. Gastroesophageal reflux disease without esophagitis - Continue with PPI  - CBC with Differential/Platelet; Future - Comprehensive metabolic panel; Future - Lipid panel; Future - TSH; Future - VITAMIN D 25 Hydroxy (Vit-D Deficiency, Fractures); Future  4. Adjustment disorder with mixed anxiety and depressed mood - Continue with Wellbutrin and Lamictal  - CBC with Differential/Platelet; Future - Comprehensive metabolic panel; Future - Lipid panel; Future - TSH; Future - VITAMIN D 25 Hydroxy (Vit-D Deficiency, Fractures); Future  5. Sleep disorder  - CBC with Differential/Platelet; Future - Comprehensive metabolic panel; Future - Lipid panel; Future - TSH; Future - VITAMIN D 25 Hydroxy (Vit-D Deficiency, Fractures); Future  Shirline Frees, NP

## 2023-01-16 ENCOUNTER — Ambulatory Visit: Payer: Commercial Managed Care - PPO | Admitting: Physical Therapy

## 2023-01-16 ENCOUNTER — Other Ambulatory Visit: Payer: Self-pay | Admitting: Adult Health

## 2023-01-16 ENCOUNTER — Other Ambulatory Visit (HOSPITAL_COMMUNITY): Payer: Self-pay

## 2023-01-16 DIAGNOSIS — M6281 Muscle weakness (generalized): Secondary | ICD-10-CM | POA: Diagnosis not present

## 2023-01-16 DIAGNOSIS — M62838 Other muscle spasm: Secondary | ICD-10-CM

## 2023-01-16 DIAGNOSIS — N941 Unspecified dyspareunia: Secondary | ICD-10-CM | POA: Diagnosis not present

## 2023-01-16 DIAGNOSIS — R279 Unspecified lack of coordination: Secondary | ICD-10-CM | POA: Diagnosis not present

## 2023-01-16 MED ORDER — LISDEXAMFETAMINE DIMESYLATE 50 MG PO CAPS
50.0000 mg | ORAL_CAPSULE | Freq: Every day | ORAL | 0 refills | Status: DC
  Filled 2023-01-16: qty 30, 30d supply, fill #0

## 2023-01-16 NOTE — Therapy (Signed)
OUTPATIENT PHYSICAL THERAPY FEMALE PELVIC TREATMENT   Patient Name: Lisa Hines MRN: [id number] DOB:May 11, 1995, 27 y.o., female Today's Date: 01/16/2023  END OF SESSION:  PT End of Session - 01/16/23 1214     Visit Number 3    Date for PT Re-Evaluation 05/05/23    Authorization Type [facility] aetna    PT Start Time 1145    PT Stop Time 1223    PT Time Calculation (min) 38 min    Activity Tolerance Patient tolerated treatment well    Behavior During Therapy [facility] for tasks assessed/performed              Past Medical History:  Diagnosis Date   Acute appendicitis with localized peritonitis 10/28/2014   ADD (attention deficit disorder)    Family history of adverse reaction to anesthesia    unknown---pt. is adopted   History of Clostridium difficile colitis    Infectious mononucleosis 01/25/2014   no obvious complication except she complains of severe pain in her throat not responsive to ibuprofen discussed  options pain medicines steroid risk benefit    Irregular menses 11/08/2010   Knee injury 04/19/2011   Poss traumatic bursitis tendinitis knee looks stable  .    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   Ingrown toenail 03/26/2019   Left breast lump 08/12/2015   Borderline anemia 09/27/2014   Well adolescent visit 10/30/2011   Acne 10/30/2011   Pes planus 04/19/2011   ADD (attention deficit disorder)    Pes planus    PES  PLANUS, 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   PAIN:  Are you having pain? Yes NPRS scale: 9/10 during intercourse, 2-3/10 after Pain location: Internal  Pain type: pressure, achy/sore Pain description: constant   Aggravating factors: any vaginal penetration Relieving factors: removal of penetration  PRECAUTIONS: None  RED FLAGS: None   WEIGHT BEARING RESTRICTIONS: No  FALLS:  Has patient fallen in last 6 months? No  LIVING ENVIRONMENT: Lives with:  roommate Lives in: House/apartment   OCCUPATION: paralegal   PLOF: Independent  PATIENT GOALS: to have less pain  PERTINENT HISTORY:  Appendix removal 2016 Sexual abuse: No  BOWEL MOVEMENT: Pain with bowel movement: No Type of bowel movement:Type (Bristol Stool Scale) 4, Frequency daily, and Strain No Fully empty rectum: Yes:   Leakage: No Pads: No Fiber supplement: No  URINATION: Pain with urination: No Fully empty bladder: Yes:   Stream: Strong Urgency: No Frequency: normal Leakage:  very minimal (drop) with heavy laugher with full bladder Pads: No  INTERCOURSE: Pain with intercourse: Initial Penetration, During Penetration, and After Intercourse Ability to have vaginal penetration:  Yes: but painful, denies dryness on average, uses condoms which helps with lubricant Climax: not painful Marinoff  Scale: 0/3  PREGNANCY: Vaginal deliveries 0 Tearing No C-section deliveries 0 Currently pregnant No  PROLAPSE: None   OBJECTIVE:  Note: Objective measures were completed at Evaluation unless otherwise noted.  DIAGNOSTIC FINDINGS:    COGNITION: Overall cognitive status: Within functional limits for tasks assessed     SENSATION: Light touch: Appears intact Proprioception: Appears intact  MUSCLE LENGTH: Bil hamstrings limited by 25%  LUMBAR SPECIAL TESTS:  WFL  FUNCTIONAL TESTS:  WFL  GAIT: WFL  POSTURE: No Significant postural limitations  PELVIC ALIGNMENT:WFL  LUMBARAROM/PROM:  A/PROM A/PROM  eval  Flexion WFL  Extension WFL  Right lateral flexion WFL  Left lateral flexion WFL  Right rotation Limited by 25%  Left rotation Limited by 25%   (Blank rows = not tested)  LOWER EXTREMITY ROM:  WFL  LOWER EXTREMITY MMT:  Bil hips grossly 4+/5; knees 5/5  PALPATION:   General  no TTP or restrictions in abdomen; mild tightness at bil lumbar paraspinals                External Perineal Exam TTP at Rt bulbocavernosus, dryness noted mildly, decreased mobility at clitoral hood                             Internal Pelvic Floor TTP noted in superficial and deep layers bil however more tension and TTP at superficial layer. Trigger points noted at bil bulbocavernosus, very limited mobility at perineal body with TTP.   Patient confirms identification and approves PT to assess internal pelvic floor and treatment Yes No emotional/communication barriers or cognitive limitation. Patient is motivated to learn. Patient understands and agrees with treatment goals and plan. PT explains patient will be examined in standing, sitting, and lying down to see how their muscles and joints work. When they are ready, they will be asked to remove their underwear so PT can examine their perineum. The patient is also given the option of providing their own chaperone as one is not  provided in our facility. The patient also has the right and is explained the right to defer or refuse any part of the evaluation or treatment including the internal exam. With the patient's consent, PT will use  one gloved finger to gently assess the muscles of the pelvic floor, seeing how well it contracts and relaxes and if there is muscle symmetry. After, the patient will get dressed and PT and patient will discuss exam findings and plan of care. PT and patient discuss plan of care, schedule, attendance policy and HEP activities.  PELVIC MMT:   MMT eval  Vaginal 4/5 with cues for mechanics; initially 3/5  Internal Anal Sphincter   External Anal Sphincter   Puborectalis   Diastasis Recti   (Blank rows = not tested)        TONE: Slightly increased   PROLAPSE: Not seen in hooklying   TODAY'S TREATMENT:                                                                                                                              DATE:   01/02/23 EVAL Examination completed, findings reviewed, pt educated on POC, HEP. Pt motivated to participate in PT and agreeable to attempt recommendations.    01/09/23: Patient consented to internal pelvic floor treatment vaginally this date and found to have continued tension bil and trigger points throughout. Pt tolerated gentle stretching at bil bulbocavernosus without increased pain however with deeper palpation of Rt side pt demonstrated increased tension at gluteals and Rt hip with IR noted. Pt benefited from verbal cues for relaxation with moderated improvement, greatest improvement with this noted with top>down relaxation technique and cues for release of bil ischial tuberosities. However pt does demonstrate consistent return of clenching and requires cues throughout to decrease. Unable to fully release deeper layer tension today but superficial had fair release noted and tolerated palpation without pain here post treatment.   Seated foam roller x15  Rt/Lt Happy baby 2x30s Butterfly stretch 2x30s  01/16/23: Patient consented to internal pelvic floor treatment vaginally this date and found to have continued tension bil and trigger points superficial pelvic floor. Pt tolerated gentle stretching at bil bulbocavernosus without increased pain. Pt benefited from verbal cues for relaxation. However pt does demonstrate consistent return of clenching and requires cues throughout to decrease but less than last appointment. Tolerated well with improved trigger point release noted.    Happy baby 2x30s Butterfly stretch 2x30s Windshield wipers x20 each Piriformis stretch 3ex0s each PATIENT EDUCATION:  Education details: Insurance underwriter Person educated: Patient Education method: Explanation, Demonstration, Tactile cues, Verbal cues, and Handouts Education comprehension: verbalized understanding and returned demonstration  HOME EXERCISE PROGRAM: [id number]  ASSESSMENT:  CLINICAL IMPRESSION: Patient presents for treatment, session focused on relaxation technique and manual work internally with pt consent for decreased tissue tension. Pt tolerated well with max, consistent cues for technique and breathing for relaxation. Pt continues to have tension throughout pelvic floor and would benefit from additional treatment for this. Pt would benefit from additional PT to further address deficits.    OBJECTIVE IMPAIRMENTS: decreased activity tolerance, decreased coordination, decreased mobility, decreased strength, increased muscle spasms, impaired flexibility, improper body mechanics,  and pain.   ACTIVITY LIMITATIONS:  intercourse/medical exams  PARTICIPATION LIMITATIONS: interpersonal relationship  PERSONAL FACTORS: Time since onset of injury/illness/exacerbation are also affecting patient's functional outcome.   REHAB POTENTIAL: Good  CLINICAL DECISION MAKING: Stable/uncomplicated  EVALUATION COMPLEXITY: Low   GOALS: Goals reviewed with patient?  Yes  SHORT TERM GOALS: Target date: 01/30/23  Pt to be I with HEP.  Baseline: Goal status: INITIAL  2.  Pt will report no more than 6/10 pain due to improvements in posture, strength, and muscle length for improved tolerance to vaginal penetration. Baseline:  Goal status: INITIAL  3.  Pt to be I with relaxation techniques to decreased strain at pelvic floor and decreased pain with vaginal penetration.  Baseline:  Goal status: INITIAL   LONG TERM GOALS: Target date: 05/05/23  Pt to be I with advanced HEP.  Baseline:  Goal status: INITIAL  2.  Pt will report no more than 1/10 pain due to improvements in posture, strength, and muscle length for improved tolerance to vaginal penetration. Baseline:  Goal status: INITIAL  3.  Pt to demonstrate full ROM with bil hips and trunk for decreased strain at pelvic floor.  Baseline:  Goal status: INITIAL  4.  Pt to report no leakage with strong stress of laughter for improved QOL.  Baseline:  Goal status: INITIAL  5.  Pt to tolerate vaginal penetration with dilator size 6 or equivalent for improved tolerance to vaginal medical exams.  Baseline:  Goal status: INITIAL    PLAN:  PT FREQUENCY: 1x/week  PT DURATION:  8 sessions  PLANNED INTERVENTIONS: 97110-Therapeutic exercises, 97530- Therapeutic activity, 97112- Neuromuscular re-education, 97535- Self Care, [id number]- Manual therapy, Patient/Family education, Taping, Dry Needling, Joint mobilization, Spinal mobilization, Scar mobilization, Cryotherapy, Moist heat, and Biofeedback  PLAN FOR NEXT SESSION: internal as needed and pt consents, relaxation techniques, pelvic wand/dilator?, moisturizers/lubricants, hip and trunk stretching, intercourse handout   Otelia Sergeant, PT, DPT 01/16/2411:25 PM

## 2023-01-23 ENCOUNTER — Ambulatory Visit: Payer: Commercial Managed Care - PPO | Attending: Obstetrics | Admitting: Physical Therapy

## 2023-01-23 DIAGNOSIS — R279 Unspecified lack of coordination: Secondary | ICD-10-CM | POA: Diagnosis not present

## 2023-01-23 DIAGNOSIS — M6281 Muscle weakness (generalized): Secondary | ICD-10-CM | POA: Insufficient documentation

## 2023-01-23 DIAGNOSIS — M62838 Other muscle spasm: Secondary | ICD-10-CM | POA: Insufficient documentation

## 2023-01-23 NOTE — Therapy (Signed)
OUTPATIENT PHYSICAL THERAPY FEMALE PELVIC TREATMENT   Patient Name: Lisa Hines MRN: [id number] DOB:07-18-1995, 27 y.o., female Today's Date: 01/23/2023  END OF SESSION:  PT End of Session - 01/23/23 1147     Visit Number 4    Date for PT Re-Evaluation 05/05/23    Authorization Type [facility] aetna    PT Start Time 1145    PT Stop Time 1226    PT Time Calculation (min) 41 min    Activity Tolerance Patient tolerated treatment well    Behavior During Therapy [facility] for tasks assessed/performed              Past Medical History:  Diagnosis Date   Acute appendicitis with localized peritonitis 10/28/2014   ADD (attention deficit disorder)    Family history of adverse reaction to anesthesia    unknown---pt. is adopted   History of Clostridium difficile colitis    Infectious mononucleosis 01/25/2014   no obvious complication except she complains of severe pain in her throat not responsive to ibuprofen discussed  options pain medicines steroid risk benefit    Irregular menses 11/08/2010   Knee injury 04/19/2011   Poss traumatic bursitis tendinitis knee looks stable  .    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   Ingrown toenail 03/26/2019   Left breast lump 08/12/2015   Borderline anemia 09/27/2014   Well adolescent visit 10/30/2011   Acne 10/30/2011   Pes planus 04/19/2011   ADD (attention deficit disorder)    Pes planus    PES  PLANUS, 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 Sore after.    PAIN:  Are you having pain? Yes NPRS scale: 7/10 during intercourse, 5/10 after Pain location: Internal  Pain type: pressure, achy/sore Pain description: constant   Aggravating factors: any vaginal penetration Relieving factors: removal of penetration  PRECAUTIONS: None  RED FLAGS: None   WEIGHT BEARING RESTRICTIONS: No  FALLS:  Has patient fallen in last 6 months? No  LIVING ENVIRONMENT: Lives with:  roommate Lives in: House/apartment   OCCUPATION: paralegal   PLOF: Independent  PATIENT GOALS: to have less pain  PERTINENT HISTORY:  Appendix removal 2016 Sexual abuse: No  BOWEL MOVEMENT: Pain with bowel movement: No Type of bowel movement:Type (Bristol Stool Scale) 4, Frequency daily, and Strain No Fully empty rectum: Yes:   Leakage: No Pads: No Fiber supplement: No  URINATION: Pain with urination: No Fully empty bladder: Yes:   Stream: Strong Urgency: No Frequency: normal Leakage:  very minimal (drop) with heavy laugher with full bladder Pads: No  INTERCOURSE: Pain with intercourse: Initial Penetration, During Penetration, and After Intercourse Ability to have vaginal penetration:  Yes: but painful, denies dryness on average, uses condoms  which helps with lubricant Climax: not painful Marinoff Scale: 0/3  PREGNANCY: Vaginal deliveries 0 Tearing No C-section deliveries 0 Currently pregnant No  PROLAPSE: None   OBJECTIVE:  Note: Objective measures were completed at Evaluation unless otherwise noted.  DIAGNOSTIC FINDINGS:    COGNITION: Overall cognitive status: Within functional limits for tasks assessed     SENSATION: Light touch: Appears intact Proprioception: Appears intact  MUSCLE LENGTH: Bil hamstrings limited by 25%  LUMBAR SPECIAL TESTS:  WFL  FUNCTIONAL TESTS:  WFL  GAIT: WFL  POSTURE: No Significant postural limitations  PELVIC ALIGNMENT:WFL  LUMBARAROM/PROM:  A/PROM A/PROM  eval  Flexion WFL  Extension WFL  Right lateral flexion WFL  Left lateral flexion WFL  Right rotation Limited by 25%  Left rotation Limited by 25%   (Blank rows = not tested)  LOWER EXTREMITY ROM:  WFL  LOWER EXTREMITY MMT:  Bil hips grossly 4+/5; knees 5/5  PALPATION:   General  no TTP or restrictions in abdomen; mild tightness at bil lumbar paraspinals                External Perineal Exam TTP at Rt bulbocavernosus, dryness noted mildly, decreased mobility at clitoral hood                             Internal Pelvic Floor TTP noted in superficial and deep layers bil however more tension and TTP at superficial layer. Trigger points noted at bil bulbocavernosus, very limited mobility at perineal body with TTP.   Patient confirms identification and approves PT to assess internal pelvic floor and treatment Yes No emotional/communication barriers or cognitive limitation. Patient is motivated to learn. Patient understands and agrees with treatment goals and plan. PT explains patient will be examined in standing, sitting, and lying down to see how their muscles and joints work. When they are ready, they will be asked to remove their underwear so PT can examine their perineum. The patient is also given the  option of providing their own chaperone as one is not provided in our facility. The patient also has the right and is explained the right to defer or refuse any part of the evaluation or treatment including the internal exam.  With the patient's consent, PT will use one gloved finger to gently assess the muscles of the pelvic floor, seeing how well it contracts and relaxes and if there is muscle symmetry. After, the patient will get dressed and PT and patient will discuss exam findings and plan of care. PT and patient discuss plan of care, schedule, attendance policy and HEP activities.  PELVIC MMT:   MMT eval  Vaginal 4/5 with cues for mechanics; initially 3/5  Internal Anal Sphincter   External Anal Sphincter   Puborectalis   Diastasis Recti   (Blank rows = not tested)        TONE: Slightly increased   PROLAPSE: Not seen in hooklying   TODAY'S TREATMENT:                                                                                                                              DATE:  01/16/23: Patient consented to internal pelvic floor treatment vaginally this date and found to have continued tension bil and trigger points superficial pelvic floor. Pt tolerated gentle stretching at bil bulbocavernosus without increased pain. Pt benefited from verbal cues for relaxation. However pt does demonstrate consistent return of clenching and requires cues throughout to decrease but less than last appointment. Tolerated well with improved trigger point release noted.    Happy baby 2x30s Butterfly stretch 2x30s Windshield wipers x20 each Piriformis stretch 3x20s each  01/23/23 Patient consented to internal pelvic floor treatment vaginally this date and found to have continued tension bil worse at Lt superficially and only trigger points at Lt side pelvic floor at deeper layer today. Pt tolerated gentle stretching at bil bulbocavernosus without increased pain. Pt benefited from verbal cues for  relaxation. However pt does demonstrate several times of return of clenching and requires cues throughout to decrease but able to with cues. Tolerated well with improved trigger point release noted.  Obturator internus more tense and had trigger points at Lt side today, released well.  During internal manual work pt Educated on pelvic wand or vibrating wand use for improved blood flow and tissue mobility for decreased pain with penetration for medical exam. Half kneel hip flexor stretch 2x30s Quad ant/post rocking with alt hip IR and ER x5 20s each Pigeon pose 2x30s      PATIENT EDUCATION:  Education details: Insurance underwriter Person educated: Patient Education method: Programmer, multimedia, Demonstration, Actor cues, Verbal cues, and Handouts Education comprehension: verbalized understanding and returned demonstration  HOME EXERCISE PROGRAM: [id number]  ASSESSMENT:  CLINICAL IMPRESSION: Patient presents for treatment, session focused on relaxation technique and manual work internally with pt consent for decreased tissue tension. Pt tolerated well with moderate cues for technique and breathing for relaxation. Pt demonstrated more tension at Lt side of pelvic floor today with several trigger points, did release fairly with manual today. Pt continues to have tension throughout pelvic floor and would benefit from additional treatment for this. Pt would benefit from additional PT to  further address deficits.    OBJECTIVE IMPAIRMENTS: decreased activity tolerance, decreased coordination, decreased mobility, decreased strength, increased muscle spasms, impaired flexibility, improper body mechanics, and pain.   ACTIVITY LIMITATIONS:  intercourse/medical exams  PARTICIPATION LIMITATIONS: interpersonal relationship  PERSONAL FACTORS: Time since onset of injury/illness/exacerbation are also affecting patient's functional outcome.   REHAB POTENTIAL: Good  CLINICAL DECISION MAKING:  Stable/uncomplicated  EVALUATION COMPLEXITY: Low   GOALS: Goals reviewed with patient? Yes  SHORT TERM GOALS: Target date: 01/30/23  Pt to be I with HEP.  Baseline: Goal status: INITIAL  2.  Pt will report no more than 6/10 pain due to improvements in posture, strength, and muscle length for improved tolerance to vaginal penetration. Baseline:  Goal status: INITIAL  3.  Pt to be I with relaxation techniques to decreased strain at pelvic floor and decreased pain with vaginal penetration.  Baseline:  Goal status: INITIAL   LONG TERM GOALS: Target date: 05/05/23  Pt to be I with advanced HEP.  Baseline:  Goal status: INITIAL  2.  Pt will report no more than 1/10 pain due to improvements in posture, strength, and muscle length for improved tolerance to vaginal penetration. Baseline:  Goal status: INITIAL  3.  Pt to demonstrate full ROM with bil hips and trunk for decreased strain at pelvic floor.  Baseline:  Goal status: INITIAL  4.  Pt to report no leakage with strong stress of laughter for improved QOL.  Baseline:  Goal status: INITIAL  5.  Pt to tolerate vaginal penetration with dilator size 6 or equivalent for improved tolerance to vaginal medical exams.  Baseline:  Goal status: INITIAL    PLAN:  PT FREQUENCY: 1x/week  PT DURATION:  8 sessions  PLANNED INTERVENTIONS: 97110-Therapeutic exercises, 97530- Therapeutic activity, 97112- Neuromuscular re-education, 97535- Self Care, [id number]- Manual therapy, Patient/Family education, Taping, Dry Needling, Joint mobilization, Spinal mobilization, Scar mobilization, Cryotherapy, Moist heat, and Biofeedback  PLAN FOR NEXT SESSION: internal as needed and pt consents, relaxation techniques, pelvic wand/dilator?, moisturizers/lubricants, hip and trunk stretching, intercourse handout   Otelia Sergeant, PT, DPT 11/07/243:55 PM

## 2023-01-30 ENCOUNTER — Ambulatory Visit: Payer: Commercial Managed Care - PPO | Admitting: Physical Therapy

## 2023-01-30 DIAGNOSIS — R279 Unspecified lack of coordination: Secondary | ICD-10-CM

## 2023-01-30 DIAGNOSIS — M62838 Other muscle spasm: Secondary | ICD-10-CM | POA: Diagnosis not present

## 2023-01-30 DIAGNOSIS — M6281 Muscle weakness (generalized): Secondary | ICD-10-CM | POA: Diagnosis not present

## 2023-01-30 NOTE — Therapy (Signed)
OUTPATIENT PHYSICAL THERAPY FEMALE PELVIC TREATMENT   Patient Name: Lisa Hines MRN: [id number] DOB:13-Oct-1995, 27 y.o., female Today's Date: 01/30/2023  END OF SESSION:  PT End of Session - 01/30/23 0805     Visit Number 5    Date for PT Re-Evaluation 05/05/23    Authorization Type [facility] aetna    PT Start Time 0803    PT Stop Time 0845    PT Time Calculation (min) 42 min    Activity Tolerance Patient tolerated treatment well    Behavior During Therapy [facility] for tasks assessed/performed               Past Medical History:  Diagnosis Date   Acute appendicitis with localized peritonitis 10/28/2014   ADD (attention deficit disorder)    Family history of adverse reaction to anesthesia    unknown---pt. is adopted   History of Clostridium difficile colitis    Infectious mononucleosis 01/25/2014   no obvious complication except she complains of severe pain in her throat not responsive to ibuprofen discussed  options pain medicines steroid risk benefit    Irregular menses 11/08/2010   Knee injury 04/19/2011   Poss traumatic bursitis tendinitis knee looks stable  .    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   Ingrown toenail 03/26/2019   Left breast lump 08/12/2015   Borderline anemia 09/27/2014   Well adolescent visit 10/30/2011   Acne 10/30/2011   Pes planus 04/19/2011   ADD (attention deficit disorder)    Pes planus    PES  PLANUS, 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    PAIN:  Are you having pain? Yes NPRS scale: 7/10 during intercourse, 5/10 after Pain location: Internal  Pain type: pressure, achy/sore Pain description: constant   Aggravating factors: any vaginal penetration Relieving factors: removal of penetration  PRECAUTIONS: None  RED FLAGS: None   WEIGHT BEARING RESTRICTIONS: No  FALLS:  Has patient fallen in last 6 months? No  LIVING ENVIRONMENT: Lives with:  roommate Lives in: House/apartment   OCCUPATION: paralegal   PLOF: Independent  PATIENT GOALS: to have less pain  PERTINENT HISTORY:  Appendix removal 2016 Sexual abuse: No  BOWEL MOVEMENT: Pain with bowel movement: No Type of bowel movement:Type (Bristol Stool Scale) 4, Frequency daily, and Strain No Fully empty rectum: Yes:   Leakage: No Pads: No Fiber supplement: No  URINATION: Pain with urination: No Fully empty bladder: Yes:   Stream: Strong Urgency: No Frequency: normal Leakage:  very minimal (drop) with heavy laugher with full bladder Pads: No  INTERCOURSE: Pain with intercourse: Initial Penetration, During Penetration, and After Intercourse Ability to have vaginal penetration:  Yes: but painful, denies dryness on average, uses condoms  which helps with lubricant Climax: not painful Marinoff Scale: 0/3  PREGNANCY: Vaginal deliveries 0 Tearing No C-section deliveries 0 Currently pregnant No  PROLAPSE: None   OBJECTIVE:  Note: Objective measures were completed at Evaluation unless otherwise noted.  DIAGNOSTIC FINDINGS:    COGNITION: Overall cognitive status: Within functional limits for tasks assessed     SENSATION: Light touch: Appears intact Proprioception: Appears intact  MUSCLE LENGTH: Bil hamstrings limited by 25%  LUMBAR SPECIAL TESTS:  WFL  FUNCTIONAL TESTS:  WFL  GAIT: WFL  POSTURE: No Significant postural limitations  PELVIC ALIGNMENT:WFL  LUMBARAROM/PROM:  A/PROM A/PROM  eval  Flexion WFL  Extension WFL  Right lateral flexion WFL  Left lateral flexion WFL  Right rotation Limited by 25%  Left rotation Limited by 25%   (Blank rows = not tested)  LOWER EXTREMITY ROM:  WFL  LOWER EXTREMITY MMT:  Bil hips grossly 4+/5; knees 5/5  PALPATION:   General  no TTP or restrictions in abdomen; mild tightness at bil lumbar paraspinals                External Perineal Exam TTP at Rt bulbocavernosus, dryness noted mildly, decreased mobility at clitoral hood                             Internal Pelvic Floor TTP noted in superficial and deep layers bil however more tension and TTP at superficial layer. Trigger points noted at bil bulbocavernosus, very limited mobility at perineal body with TTP.   Patient confirms identification and approves PT to assess internal pelvic floor and treatment Yes No emotional/communication barriers or cognitive limitation. Patient is motivated to learn. Patient understands and agrees with treatment goals and plan. PT explains patient will be examined in standing, sitting, and lying down to see how their muscles and joints work. When they are ready, they will be asked to remove their underwear so PT can examine their perineum. The patient is also given the  option of providing their own chaperone as one is not provided in our facility. The patient also has the right and is explained the right to defer or refuse any part of the evaluation or treatment including the internal exam. With the patient's consent, PT will  use one gloved finger to gently assess the muscles of the pelvic floor, seeing how well it contracts and relaxes and if there is muscle symmetry. After, the patient will get dressed and PT and patient will discuss exam findings and plan of care. PT and patient discuss plan of care, schedule, attendance policy and HEP activities.  PELVIC MMT:   MMT eval  Vaginal 4/5 with cues for mechanics; initially 3/5  Internal Anal Sphincter   External Anal Sphincter   Puborectalis   Diastasis Recti   (Blank rows = not tested)        TONE: Slightly increased   PROLAPSE: Not seen in hooklying   TODAY'S TREATMENT:                                                                                                                              DATE:  01/16/23: Patient consented to internal pelvic floor treatment vaginally this date and found to have continued tension bil and trigger points superficial pelvic floor. Pt tolerated gentle stretching at bil bulbocavernosus without increased pain. Pt benefited from verbal cues for relaxation. However pt does demonstrate consistent return of clenching and requires cues throughout to decrease but less than last appointment. Tolerated well with improved trigger point release noted.    Happy baby 2x30s Butterfly stretch 2x30s Windshield wipers x20 each Piriformis stretch 3x20s each  01/23/23 Patient consented to internal pelvic floor treatment vaginally this date and found to have continued tension bil worse at Lt superficially and only trigger points at Lt side pelvic floor at deeper layer today. Pt tolerated gentle stretching at bil bulbocavernosus without increased pain. Pt benefited from verbal cues for  relaxation. However pt does demonstrate several times of return of clenching and requires cues throughout to decrease but able to with cues. Tolerated well with improved trigger point release noted.  Obturator internus more tense and had trigger points at Lt side today, released well.  During internal manual work pt Educated on pelvic wand or vibrating wand use for improved blood flow and tissue mobility for decreased pain with penetration for medical exam. Half kneel hip flexor stretch 2x30s Quad ant/post rocking with alt hip IR and ER x5 20s each Pigeon pose 2x30s   01/30/23: Vibration plate 2 min in standing with mini squat, 2 mins in sitting with forward trunk lean  Patient consented to internal pelvic floor treatment vaginally this date and found to have continued tension bil worse at Lt superficially and only trigger points at Lt side pelvic floor. Focus on superficial trigger point at lt side. Pt tolerated gentle stretching at bil bulbocavernosus without increased pain but did need cues for decreased clenching at gluteals and thighs consistently with this able to have much more mobility and decreased pain with palpation. Tolerated well with improved trigger point release noted.  Pt states she sometimes have more pain with deeper penetration but mostly initial penetration, position dependent. During internal pt educated  on continued relaxation techniques with meditation, mindful masturbation as needed, gentle stretching with vaginal dilators vs different size of vibration wands vs pelvic wand Quad rocking ant/post with hip IR/ER x10 Pigeon pose 3x30s Adductor rocking x10 bil  PATIENT EDUCATION:  Education details: Insurance underwriter Person educated: Patient Education method: Explanation, Demonstration, Tactile cues, Verbal cues, and Handouts Education comprehension: verbalized understanding and returned demonstration  HOME EXERCISE PROGRAM: [id number]  ASSESSMENT:  CLINICAL IMPRESSION: Patient  presents for treatment, session focused on relaxation technique and manual work internally with pt consent for decreased tissue tension. Pt tolerated well with moderate cues for technique and breathing for relaxation. Pt demonstrated more tension at Lt side of pelvic floor today with several trigger points, did release fairly with manual today. Pt continues to have tension throughout pelvic floor and would benefit from additional treatment for this. Pt would benefit from additional PT to further address deficits.    OBJECTIVE IMPAIRMENTS: decreased activity tolerance, decreased coordination, decreased mobility, decreased strength, increased muscle spasms, impaired flexibility, improper body mechanics, and pain.   ACTIVITY LIMITATIONS:  intercourse/medical exams  PARTICIPATION LIMITATIONS: interpersonal relationship  PERSONAL FACTORS: Time since onset of injury/illness/exacerbation are also affecting patient's functional outcome.   REHAB POTENTIAL: Good  CLINICAL DECISION MAKING: Stable/uncomplicated  EVALUATION COMPLEXITY: Low   GOALS: Goals reviewed with patient? Yes  SHORT TERM GOALS: Target date: 01/30/23  Pt to be I with HEP.  Baseline: Goal status: MET  2.  Pt will report no more than 6/10 pain due to improvements in posture, strength, and muscle length for improved tolerance to vaginal penetration. Baseline:  Goal status: on going - inconsistent  3.  Pt to be I with relaxation techniques to decreased strain at pelvic floor and decreased pain with vaginal penetration.  Baseline:  Goal status: MET - greatly improved ability to feel sensation of tension at pelvic floor and improving ability to relax   LONG TERM GOALS: Target date: 05/05/23  Pt to be I with advanced HEP.  Baseline:  Goal status: on going  2.  Pt will report no more than 1/10 pain due to improvements in posture, strength, and muscle length for improved tolerance to vaginal penetration. Baseline:  Goal  status: on going  3.  Pt to demonstrate full ROM with bil hips and trunk for decreased strain at pelvic floor.  Baseline:  Goal status: on going  4.  Pt to report no leakage with strong stress of laughter for improved QOL.  Baseline:  Goal status: on going  5.  Pt to tolerate vaginal penetration with dilator size 6 or equivalent for improved tolerance to vaginal medical exams.  Baseline:  Goal status: on going    PLAN:  PT FREQUENCY: 1x/week  PT DURATION:  8 sessions  PLANNED INTERVENTIONS: 97110-Therapeutic exercises, 97530- Therapeutic activity, 97112- Neuromuscular re-education, 97535- Self Care, 47829- Manual therapy, Patient/Family education, Taping, Dry Needling, Joint mobilization, Spinal mobilization, Scar mobilization, Cryotherapy, Moist heat, and Biofeedback  PLAN FOR NEXT SESSION: internal as needed and pt consents, relaxation techniques, pelvic wand/dilator?, moisturizers/lubricants, hip and trunk stretching, intercourse handout   Otelia Sergeant, PT, DPT 11/14/249:43 AM

## 2023-02-06 ENCOUNTER — Encounter: Payer: Self-pay | Admitting: Physical Therapy

## 2023-02-06 ENCOUNTER — Ambulatory Visit: Payer: Commercial Managed Care - PPO | Admitting: Physical Therapy

## 2023-02-06 DIAGNOSIS — M6281 Muscle weakness (generalized): Secondary | ICD-10-CM

## 2023-02-06 DIAGNOSIS — M62838 Other muscle spasm: Secondary | ICD-10-CM | POA: Diagnosis not present

## 2023-02-06 DIAGNOSIS — R279 Unspecified lack of coordination: Secondary | ICD-10-CM

## 2023-02-06 NOTE — Therapy (Signed)
OUTPATIENT PHYSICAL THERAPY FEMALE PELVIC TREATMENT   Patient Name: Lisa Hines MRN: [id number] DOB:1995/08/04, 27 y.o., female Today's Date: 02/06/2023  END OF SESSION:  PT End of Session - 02/06/23 0803     Visit Number 6    Date for PT Re-Evaluation 05/05/23    Authorization Type [facility] aetna    PT Start Time 0802    PT Stop Time 0845    PT Time Calculation (min) 43 min    Activity Tolerance Patient tolerated treatment well    Behavior During Therapy [facility] for tasks assessed/performed               Past Medical History:  Diagnosis Date   Acute appendicitis with localized peritonitis 10/28/2014   ADD (attention deficit disorder)    Family history of adverse reaction to anesthesia    unknown---pt. is adopted   History of Clostridium difficile colitis    Infectious mononucleosis 01/25/2014   no obvious complication except she complains of severe pain in her throat not responsive to ibuprofen discussed  options pain medicines steroid risk benefit    Irregular menses 11/08/2010   Knee injury 04/19/2011   Poss traumatic bursitis tendinitis knee looks stable  .    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   Ingrown toenail 03/26/2019   Left breast lump 08/12/2015   Borderline anemia 09/27/2014   Well adolescent visit 10/30/2011   Acne 10/30/2011   Pes planus 04/19/2011   ADD (attention deficit disorder)    Pes planus    PES  PLANUS, 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   PAIN:  Are you having pain? no NPRS scale: 0/10 during intercourse Pain location: Internal  Pain type: pressure, achy/sore Pain description: constant   Aggravating factors: any vaginal penetration Relieving factors: removal of penetration  PRECAUTIONS: None  RED FLAGS: None   WEIGHT BEARING RESTRICTIONS: No  FALLS:  Has patient fallen in last 6 months? No  LIVING ENVIRONMENT: Lives with:  roommate Lives in: House/apartment   OCCUPATION: paralegal   PLOF: Independent  PATIENT GOALS: to have less pain  PERTINENT HISTORY:  Appendix removal 2016 Sexual abuse: No  BOWEL MOVEMENT: Pain with bowel movement: No Type of bowel movement:Type (Bristol Stool Scale) 4, Frequency daily, and Strain No Fully empty rectum: Yes:   Leakage: No Pads: No Fiber supplement: No  URINATION: Pain with urination: No Fully empty bladder: Yes:   Stream: Strong Urgency: No Frequency: normal Leakage:  very minimal (drop) with heavy laugher with full bladder Pads: No  INTERCOURSE: Pain with intercourse: Initial Penetration, During Penetration, and After Intercourse Ability to have vaginal penetration:  Yes: but painful, denies dryness on average, uses  condoms which helps with lubricant Climax: not painful Marinoff Scale: 0/3  PREGNANCY: Vaginal deliveries 0 Tearing No C-section deliveries 0 Currently pregnant No  PROLAPSE: None   OBJECTIVE:  Note: Objective measures were completed at Evaluation unless otherwise noted.  DIAGNOSTIC FINDINGS:    COGNITION: Overall cognitive status: Within functional limits for tasks assessed     SENSATION: Light touch: Appears intact Proprioception: Appears intact  MUSCLE LENGTH: Bil hamstrings limited by 25%  LUMBAR SPECIAL TESTS:  WFL  FUNCTIONAL TESTS:  WFL  GAIT: WFL  POSTURE: No Significant postural limitations  PELVIC ALIGNMENT:WFL  LUMBARAROM/PROM:  A/PROM A/PROM  eval  Flexion WFL  Extension WFL  Right lateral flexion WFL  Left lateral flexion WFL  Right rotation Limited by 25%  Left rotation Limited by 25%   (Blank rows = not tested)  LOWER EXTREMITY ROM:  WFL  LOWER EXTREMITY MMT:  Bil hips grossly 4+/5; knees 5/5  PALPATION:   General  no TTP or restrictions in abdomen; mild tightness at bil lumbar paraspinals                External Perineal Exam TTP at Rt bulbocavernosus, dryness noted mildly, decreased mobility at clitoral hood                             Internal Pelvic Floor TTP noted in superficial and deep layers bil however more tension and TTP at superficial layer. Trigger points noted at bil bulbocavernosus, very limited mobility at perineal body with TTP.   Patient confirms identification and approves PT to assess internal pelvic floor and treatment Yes No emotional/communication barriers or cognitive limitation. Patient is motivated to learn. Patient understands and agrees with treatment goals and plan. PT explains patient will be examined in standing, sitting, and lying down to see how their muscles and joints work. When they are ready, they will be asked to remove their underwear so PT can examine their perineum. The patient is also given  the option of providing their own chaperone as one is not provided in our facility. The patient also has the right and is explained the right to defer or refuse any part of the evaluation or treatment including the internal exam. With the  patient's consent, PT will use one gloved finger to gently assess the muscles of the pelvic floor, seeing how well it contracts and relaxes and if there is muscle symmetry. After, the patient will get dressed and PT and patient will discuss exam findings and plan of care. PT and patient discuss plan of care, schedule, attendance policy and HEP activities.  PELVIC MMT:   MMT eval  Vaginal 4/5 with cues for mechanics; initially 3/5  Internal Anal Sphincter   External Anal Sphincter   Puborectalis   Diastasis Recti   (Blank rows = not tested)        TONE: Slightly increased   PROLAPSE: Not seen in hooklying   TODAY'S TREATMENT:                                                                                                                              DATE:  01/23/23 Patient consented to internal pelvic floor treatment vaginally this date and found to have continued tension bil worse at Lt superficially and only trigger points at Lt side pelvic floor at deeper layer today. Pt tolerated gentle stretching at bil bulbocavernosus without increased pain. Pt benefited from verbal cues for relaxation. However pt does demonstrate several times of return of clenching and requires cues throughout to decrease but able to with cues. Tolerated well with improved trigger point release noted.  Obturator internus more tense and had trigger points at Lt side today, released well.  During internal manual work pt Educated on pelvic wand or vibrating wand use for improved blood flow and tissue mobility for decreased pain with penetration for medical exam. Half kneel hip flexor stretch 2x30s Quad ant/post rocking with alt hip IR and ER x5 20s each Pigeon pose 2x30s    01/30/23: Vibration plate 2 min in standing with mini squat, 2 mins in sitting with forward trunk lean  Patient consented to internal pelvic floor treatment vaginally this date and found to have continued tension bil worse at Lt superficially and only trigger points at Lt side pelvic floor. Focus on superficial trigger point at lt side. Pt tolerated gentle stretching at bil bulbocavernosus without increased pain but did need cues for decreased clenching at gluteals and thighs consistently with this able to have much more mobility and decreased pain with palpation. Tolerated well with improved trigger point release noted.  Pt states she sometimes have more pain with deeper penetration but mostly initial penetration, position dependent. During internal pt educated on continued relaxation techniques with meditation, mindful masturbation as needed, gentle stretching with vaginal dilators vs different size of vibration wands vs pelvic wand Quad rocking ant/post with hip IR/ER x10 Pigeon pose 3x30s Adductor rocking x10 bil   02/06/23  Patient consented to internal pelvic floor treatment vaginally this date and found to have continued tension at Lt superficially and trigger points at Lt pubococcygeus. Pt tolerated gentle stretching at bil bulbocavernosus and pubococcygeus without increased pain but did need  cues for decreased clenching at gluteals and thighs minimally with this able to have much more mobility and decreased pain with palpation, tolerated trigger point release well initially did report 7/10 pain with palpation but decreased to 2/10 quickly. Adductor rocks x10 each Butterfly 3x30s Deep squat 3x30 PATIENT EDUCATION:  Education details: Insurance underwriter Person educated: Patient Education method: Explanation, Demonstration, Tactile cues, Verbal cues, and Handouts Education comprehension: verbalized understanding and returned demonstration  HOME EXERCISE  PROGRAM: [id number]  ASSESSMENT:  CLINICAL IMPRESSION: Patient presents for treatment, session focused on relaxation technique and manual work internally with pt consent for decreased tissue tension. Pt tolerated well with minimal cues for technique and breathing for relaxation. Pt demonstrated more tension at Lt side of pelvic floor today with x3 trigger points, did release well with manual today. Pt reports she has been able to have intercourse without pain since last session, and responds well with vibration for relaxation. Pt would benefit from additional PT to further address deficits.    OBJECTIVE IMPAIRMENTS: decreased activity tolerance, decreased coordination, decreased mobility, decreased strength, increased muscle spasms, impaired flexibility, improper body mechanics, and pain.   ACTIVITY LIMITATIONS:  intercourse/medical exams  PARTICIPATION LIMITATIONS: interpersonal relationship  PERSONAL FACTORS: Time since onset of injury/illness/exacerbation are also affecting patient's functional outcome.   REHAB POTENTIAL: Good  CLINICAL DECISION MAKING: Stable/uncomplicated  EVALUATION COMPLEXITY: Low   GOALS: Goals reviewed with patient? Yes  SHORT TERM GOALS: Target date: 01/30/23  Pt to be I with HEP.  Baseline: Goal status: MET  2.  Pt will report no more than 6/10 pain due to improvements in posture, strength, and muscle length for improved tolerance to vaginal penetration. Baseline:  Goal status: on going - inconsistent  3.  Pt to be I with relaxation techniques to decreased strain at pelvic floor and decreased pain with vaginal penetration.  Baseline:  Goal status: MET - greatly improved ability to feel sensation of tension at pelvic floor and improving ability to relax   LONG TERM GOALS: Target date: 05/05/23  Pt to be I with advanced HEP.  Baseline:  Goal status: on going  2.  Pt will report no more than 1/10 pain due to improvements in posture, strength, and  muscle length for improved tolerance to vaginal penetration. Baseline:  Goal status: on going  3.  Pt to demonstrate full ROM with bil hips and trunk for decreased strain at pelvic floor.  Baseline:  Goal status: on going  4.  Pt to report no leakage with strong stress of laughter for improved QOL.  Baseline:  Goal status: on going  5.  Pt to tolerate vaginal penetration with dilator size 6 or equivalent for improved tolerance to vaginal medical exams.  Baseline:  Goal status: on going    PLAN:  PT FREQUENCY: 1x/week  PT DURATION:  8 sessions  PLANNED INTERVENTIONS: 97110-Therapeutic exercises, 97530- Therapeutic activity, 97112- Neuromuscular re-education, 97535- Self Care, 54098- Manual therapy, Patient/Family education, Taping, Dry Needling, Joint mobilization, Spinal mobilization, Scar mobilization, Cryotherapy, Moist heat, and Biofeedback  PLAN FOR NEXT SESSION: internal as needed and pt consents, relaxation techniques, pelvic wand/dilator?, moisturizers/lubricants, hip and trunk stretching, intercourse handout   Otelia Sergeant, PT, DPT 11/21/249:09 AM

## 2023-02-10 ENCOUNTER — Encounter: Payer: Self-pay | Admitting: Physical Therapy

## 2023-02-19 ENCOUNTER — Ambulatory Visit: Payer: Commercial Managed Care - PPO | Attending: Obstetrics | Admitting: Physical Therapy

## 2023-02-19 DIAGNOSIS — R279 Unspecified lack of coordination: Secondary | ICD-10-CM | POA: Insufficient documentation

## 2023-02-19 DIAGNOSIS — M62838 Other muscle spasm: Secondary | ICD-10-CM | POA: Insufficient documentation

## 2023-02-19 DIAGNOSIS — M6281 Muscle weakness (generalized): Secondary | ICD-10-CM | POA: Diagnosis not present

## 2023-02-19 NOTE — Therapy (Signed)
OUTPATIENT PHYSICAL THERAPY FEMALE PELVIC TREATMENT   Patient Name: Lisa Hines MRN: [id number] DOB:April 30, 1995, 27 y.o., female Today's Date: 02/19/2023  END OF SESSION:  PT End of Session - 02/19/23 1105     Visit Number 7    Date for PT Re-Evaluation 05/05/23    Authorization Type [facility] aetna    PT Start Time 1100    PT Stop Time 1142    PT Time Calculation (min) 42 min    Activity Tolerance Patient tolerated treatment well    Behavior During Therapy [facility] for tasks assessed/performed               Past Medical History:  Diagnosis Date   Acute appendicitis with localized peritonitis 10/28/2014   ADD (attention deficit disorder)    Family history of adverse reaction to anesthesia    unknown---pt. is adopted   History of Clostridium difficile colitis    Infectious mononucleosis 01/25/2014   no obvious complication except she complains of severe pain in her throat not responsive to ibuprofen discussed  options pain medicines steroid risk benefit    Irregular menses 11/08/2010   Knee injury 04/19/2011   Poss traumatic bursitis tendinitis knee looks stable  .    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   Ingrown toenail 03/26/2019   Left breast lump 08/12/2015   Borderline anemia 09/27/2014   Well adolescent visit 10/30/2011   Acne 10/30/2011   Pes planus 04/19/2011   ADD (attention deficit disorder)    Pes planus    PES  PLANUS, 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 no NPRS scale: 0/10 during intercourse Pain location: Internal  Pain type: pressure, achy/sore Pain description: constant   Aggravating factors: any vaginal penetration Relieving factors: removal of penetration  PRECAUTIONS: None  RED FLAGS: None   WEIGHT BEARING RESTRICTIONS: No  FALLS:  Has patient fallen in last 6 months? No  LIVING ENVIRONMENT: Lives with:  roommate Lives in: House/apartment   OCCUPATION: paralegal   PLOF: Independent  PATIENT GOALS: to have less pain  PERTINENT HISTORY:  Appendix removal 2016 Sexual abuse: No  BOWEL MOVEMENT: Pain with bowel movement: No Type of bowel movement:Type (Bristol Stool Scale) 4, Frequency daily, and Strain No Fully empty rectum: Yes:   Leakage: No Pads: No Fiber supplement: No  URINATION: Pain with urination: No Fully empty bladder: Yes:   Stream: Strong Urgency: No Frequency: normal Leakage:  very minimal (drop) with heavy laugher with full bladder Pads: No  INTERCOURSE: Pain with intercourse: Initial Penetration, During Penetration, and After Intercourse Ability to have vaginal penetration:  Yes: but painful, denies dryness on average, uses condoms which helps  with lubricant Climax: not painful Marinoff Scale: 0/3  PREGNANCY: Vaginal deliveries 0 Tearing No C-section deliveries 0 Currently pregnant No  PROLAPSE: None   OBJECTIVE:  Note: Objective measures were completed at Evaluation unless otherwise noted.  DIAGNOSTIC FINDINGS:    COGNITION: Overall cognitive status: Within functional limits for tasks assessed     SENSATION: Light touch: Appears intact Proprioception: Appears intact  MUSCLE LENGTH: Bil hamstrings limited by 25%  LUMBAR SPECIAL TESTS:  WFL  FUNCTIONAL TESTS:  WFL  GAIT: WFL  POSTURE: No Significant postural limitations  PELVIC ALIGNMENT:WFL  LUMBARAROM/PROM:  A/PROM A/PROM  eval  Flexion WFL  Extension WFL  Right lateral flexion WFL  Left lateral flexion WFL  Right rotation Limited by 25%  Left rotation Limited by 25%   (Blank rows = not tested)  LOWER EXTREMITY ROM:  WFL  LOWER EXTREMITY MMT:  Bil hips grossly 4+/5; knees 5/5  PALPATION:   General  no TTP or restrictions in abdomen; mild tightness at bil lumbar paraspinals                External Perineal Exam TTP at Rt bulbocavernosus, dryness noted mildly, decreased mobility at clitoral hood                             Internal Pelvic Floor TTP noted in superficial and deep layers bil however more tension and TTP at superficial layer. Trigger points noted at bil bulbocavernosus, very limited mobility at perineal body with TTP.   Patient confirms identification and approves PT to assess internal pelvic floor and treatment Yes No emotional/communication barriers or cognitive limitation. Patient is motivated to learn. Patient understands and agrees with treatment goals and plan. PT explains patient will be examined in standing, sitting, and lying down to see how their muscles and joints work. When they are ready, they will be asked to remove their underwear so PT can examine their perineum. The patient is also given the option of  providing their own chaperone as one is not provided in our facility. The patient also has the right and is explained the right to defer or refuse any part of the evaluation or treatment including the internal exam. With the  patient's consent, PT will use one gloved finger to gently assess the muscles of the pelvic floor, seeing how well it contracts and relaxes and if there is muscle symmetry. After, the patient will get dressed and PT and patient will discuss exam findings and plan of care. PT and patient discuss plan of care, schedule, attendance policy and HEP activities.  PELVIC MMT:   MMT eval  Vaginal 4/5 with cues for mechanics; initially 3/5  Internal Anal Sphincter   External Anal Sphincter   Puborectalis   Diastasis Recti   (Blank rows = not tested)        TONE: Slightly increased   PROLAPSE: Not seen in hooklying   TODAY'S TREATMENT:                                                                                                                              DATE:  01/30/23: Vibration plate 2 min in standing with mini squat, 2 mins in sitting with forward trunk lean  Patient consented to internal pelvic floor treatment vaginally this date and found to have continued tension bil worse at Lt superficially and only trigger points at Lt side pelvic floor. Focus on superficial trigger point at lt side. Pt tolerated gentle stretching at bil bulbocavernosus without increased pain but did need cues for decreased clenching at gluteals and thighs consistently with this able to have much more mobility and decreased pain with palpation. Tolerated well with improved trigger point release noted.  Pt states she sometimes have more pain with deeper penetration but mostly initial penetration, position dependent. During internal pt educated on continued relaxation techniques with meditation, mindful masturbation as needed, gentle stretching with vaginal dilators vs different size of vibration wands vs  pelvic wand Quad rocking ant/post with hip IR/ER x10 Pigeon pose 3x30s Adductor rocking x10 bil  02/06/23  Patient consented to internal pelvic floor treatment vaginally this date and found to have continued tension at Lt superficially and trigger points at Lt pubococcygeus. Pt tolerated gentle stretching at bil bulbocavernosus and pubococcygeus without increased pain but did need cues for decreased clenching at gluteals and thighs minimally with this able to have much more mobility and decreased pain with palpation, tolerated trigger point release well initially did report 7/10 pain with palpation but decreased to 2/10 quickly. Adductor rocks x10 each Butterfly 3x30s Deep squat 3x30  02/19/23: Vibration plate 3x1 min in sitting with forward trunk lean Patient consented to internal pelvic floor treatment vaginally this date and found to have continued tension at Lt superficially and trigger points at Lt pubococcygeus, tightness at Lt obturator. Pt tolerated gentle stretching at bil bulbocavernosus and Lt obturator and pubococcygeus without increased pain but did need cues minimally for relaxation but with this able to have much more mobility and decreased pain with palpation, tolerated trigger point release, gentle stretching well initially did report pain with digit insertion but decreased quickly and no continued pain.  PATIENT EDUCATION:  Education details: Insurance underwriter Person educated: Patient Education method: Explanation, Demonstration, Tactile cues, Verbal cues, and Handouts Education comprehension: verbalized understanding and returned demonstration  HOME EXERCISE PROGRAM: [id number]  ASSESSMENT:  CLINICAL IMPRESSION: Patient presents for treatment, session focused on relaxation technique and manual work internally with pt consent for decreased tissue tension. Pt tolerated well with minimal cues for technique and breathing for relaxation. Pt demonstrated tension at Lt side of pelvic  floor today with x2trigger points, did release well with manual today. Pt reports she feels she continues to improve, doesn't have pain recently and minimal pain with penetration today for internal treatment but relieved quickly. Pt would benefit from additional PT to further address deficits.    OBJECTIVE IMPAIRMENTS: decreased activity tolerance, decreased coordination, decreased mobility, decreased strength, increased muscle spasms, impaired flexibility, improper body mechanics, and pain.   ACTIVITY LIMITATIONS:  intercourse/medical exams  PARTICIPATION LIMITATIONS: interpersonal relationship  PERSONAL FACTORS: Time since onset of injury/illness/exacerbation are also affecting patient's functional outcome.   REHAB POTENTIAL: Good  CLINICAL DECISION MAKING: Stable/uncomplicated  EVALUATION COMPLEXITY: Low   GOALS: Goals reviewed with patient? Yes  SHORT TERM GOALS: Target date: 01/30/23  Pt to be I with HEP.  Baseline: Goal status: MET  2.  Pt will report no more than 6/10 pain due to improvements in posture, strength, and muscle length for improved tolerance to vaginal penetration. Baseline:  Goal status: on going - inconsistent  3.  Pt to be I with relaxation techniques to decreased strain at pelvic floor and decreased pain with vaginal penetration.  Baseline:  Goal status: MET - greatly improved ability to feel sensation of tension at pelvic floor and improving ability to relax   LONG TERM GOALS: Target date: 05/05/23  Pt to be I with advanced HEP.  Baseline:  Goal status: on going  2.  Pt will report no more than 1/10 pain due to improvements in posture, strength, and muscle length for improved tolerance to vaginal penetration. Baseline:  Goal status: on going  3.  Pt to demonstrate full ROM with bil hips and trunk for decreased strain at pelvic floor.  Baseline:  Goal status: on going  4.  Pt to report no leakage with strong stress of laughter for improved QOL.   Baseline:  Goal status: on going  5.  Pt to tolerate vaginal penetration with dilator size 6 or equivalent for improved tolerance to vaginal medical exams.  Baseline:  Goal status: on going    PLAN:  PT FREQUENCY: 1x/week  PT DURATION:  8 sessions  PLANNED INTERVENTIONS: 97110-Therapeutic exercises, 97530- Therapeutic activity, 97112- Neuromuscular re-education, 97535- Self Care, 60109- Manual therapy, Patient/Family education, Taping, Dry Needling, Joint mobilization, Spinal mobilization, Scar mobilization, Cryotherapy, Moist heat, and Biofeedback  PLAN FOR NEXT SESSION: internal as needed and pt consents, relaxation techniques, pelvic wand/dilator?, moisturizers/lubricants, hip and trunk stretching, intercourse handout   Otelia Sergeant, PT, DPT 02/18/2410:44 AM

## 2023-02-25 DIAGNOSIS — F902 Attention-deficit hyperactivity disorder, combined type: Secondary | ICD-10-CM | POA: Diagnosis not present

## 2023-02-26 ENCOUNTER — Ambulatory Visit: Payer: Commercial Managed Care - PPO | Admitting: Physical Therapy

## 2023-02-26 DIAGNOSIS — M6281 Muscle weakness (generalized): Secondary | ICD-10-CM

## 2023-02-26 DIAGNOSIS — M62838 Other muscle spasm: Secondary | ICD-10-CM | POA: Diagnosis not present

## 2023-02-26 DIAGNOSIS — R279 Unspecified lack of coordination: Secondary | ICD-10-CM

## 2023-02-26 NOTE — Therapy (Signed)
OUTPATIENT PHYSICAL THERAPY FEMALE PELVIC TREATMENT   Patient Name: Lisa Hines MRN: [id number] DOB:03-Feb-1996, 27 y.o., female Today's Date: 02/26/2023  END OF SESSION:  PT End of Session - 02/26/23 0803     Visit Number 8    Date for PT Re-Evaluation 05/05/23    Authorization Type [facility] aetna    PT Start Time 0800    PT Stop Time 0843    PT Time Calculation (min) 43 min    Activity Tolerance Patient tolerated treatment well    Behavior During Therapy [facility] for tasks assessed/performed                Past Medical History:  Diagnosis Date   Acute appendicitis with localized peritonitis 10/28/2014   ADD (attention deficit disorder)    Family history of adverse reaction to anesthesia    unknown---pt. is adopted   History of Clostridium difficile colitis    Infectious mononucleosis 01/25/2014   no obvious complication except she complains of severe pain in her throat not responsive to ibuprofen discussed  options pain medicines steroid risk benefit    Irregular menses 11/08/2010   Knee injury 04/19/2011   Poss traumatic bursitis tendinitis knee looks stable  .    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   Ingrown toenail 03/26/2019   Left breast lump 08/12/2015   Borderline anemia 09/27/2014   Well adolescent visit 10/30/2011   Acne 10/30/2011   Pes planus 04/19/2011   ADD (attention deficit disorder)    Pes planus     PES PLANUS, 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   OTHER NONSPECIFIC FINDING EXAMINATION OF URINE 03/02/2007    PCP: Shirline Frees, NP  REFERRING PROVIDER: Marlow Baars, MD  REFERRING DIAG: N94.10 (ICD-10-CM) - Unspecified dyspareunia  THERAPY DIAG:  Muscle weakness (generalized)  Unspecified lack of coordination  Other muscle spasm  Rationale for Evaluation and Treatment: Rehabilitation  ONSET DATE: 2017  SUBJECTIVE:  SUBJECTIVE STATEMENT: Was able have intercourse without pain this week and no soreness.    PAIN:  Are you having pain? no NPRS scale: 0/10 during intercourse Pain location: Internal  Pain type: pressure, achy/sore Pain description: constant   Aggravating factors: any vaginal penetration Relieving factors: removal of penetration  PRECAUTIONS: None  RED FLAGS: None   WEIGHT BEARING RESTRICTIONS: No  FALLS:  Has patient fallen in last 6 months? No  LIVING ENVIRONMENT: Lives with:  roommate Lives in: House/apartment   OCCUPATION: paralegal   PLOF: Independent  PATIENT GOALS: to have less pain  PERTINENT HISTORY:  Appendix removal 2016 Sexual abuse: No  BOWEL MOVEMENT: Pain with bowel movement: No Type of bowel movement:Type (Bristol Stool Scale) 4, Frequency daily, and Strain No Fully empty rectum: Yes:   Leakage: No Pads: No Fiber supplement: No  URINATION: Pain with urination: No Fully empty bladder: Yes:   Stream: Strong Urgency: No Frequency: normal Leakage:  very minimal (drop) with heavy laugher with full bladder Pads: No  INTERCOURSE: Pain with intercourse: Initial Penetration, During Penetration, and After Intercourse Ability to have vaginal penetration:  Yes: but painful, denies dryness on average, uses condoms which helps with lubricant Climax:  not painful Marinoff Scale: 0/3  PREGNANCY: Vaginal deliveries 0 Tearing No C-section deliveries 0 Currently pregnant No  PROLAPSE: None   OBJECTIVE:  Note: Objective measures were completed at Evaluation unless otherwise noted.  DIAGNOSTIC FINDINGS:    COGNITION: Overall cognitive status: Within functional limits for tasks assessed     SENSATION: Light touch: Appears intact Proprioception: Appears intact  MUSCLE LENGTH: Bil hamstrings limited by 25%  LUMBAR SPECIAL TESTS:  WFL  FUNCTIONAL TESTS:  WFL  GAIT: WFL  POSTURE: No Significant postural limitations  PELVIC ALIGNMENT:WFL  LUMBARAROM/PROM:  A/PROM A/PROM  eval  Flexion WFL  Extension WFL  Right lateral flexion WFL  Left lateral flexion WFL  Right rotation Limited by 25%  Left rotation Limited by 25%   (Blank rows = not tested)  LOWER EXTREMITY ROM:  WFL  LOWER EXTREMITY MMT:  Bil hips grossly 4+/5; knees 5/5  PALPATION:   General  no TTP or restrictions in abdomen; mild tightness at bil lumbar paraspinals                External Perineal Exam TTP at Rt bulbocavernosus, dryness noted mildly, decreased mobility at clitoral hood                             Internal Pelvic Floor TTP noted in superficial and deep layers bil however more tension and TTP at superficial layer. Trigger points noted at bil bulbocavernosus, very limited mobility at perineal body with TTP.   Patient confirms identification and approves PT to assess internal pelvic floor and treatment Yes No emotional/communication barriers or cognitive limitation. Patient is motivated to learn. Patient understands and agrees with treatment goals and plan. PT explains patient will be examined in standing, sitting, and lying down to see how their muscles and joints work. When they are ready, they will be asked to remove their underwear so PT can examine their perineum. The patient is also given the option of providing their own chaperone  as one is not provided in our facility. The patient also has the right and is explained the right to defer or refuse any part of the evaluation or treatment including the internal exam. With the patient's consent, PT will use one gloved  finger to gently assess the muscles of the pelvic floor, seeing how well it contracts and relaxes and if there is muscle symmetry. After, the patient will get dressed and PT and patient will discuss exam findings and plan of care. PT and patient discuss plan of care, schedule, attendance policy and HEP activities.  PELVIC MMT:   MMT eval  Vaginal 4/5 with cues for mechanics; initially 3/5  Internal Anal Sphincter   External Anal Sphincter   Puborectalis   Diastasis Recti   (Blank rows = not tested)        TONE: Slightly increased   PROLAPSE: Not seen in hooklying   TODAY'S TREATMENT:                                                                                                                              DATE:  02/06/23  Patient consented to internal pelvic floor treatment vaginally this date and found to have continued tension at Lt superficially and trigger points at Lt pubococcygeus. Pt tolerated gentle stretching at bil bulbocavernosus and pubococcygeus without increased pain but did need cues for decreased clenching at gluteals and thighs minimally with this able to have much more mobility and decreased pain with palpation, tolerated trigger point release well initially did report 7/10 pain with palpation but decreased to 2/10 quickly. Adductor rocks x10 each Butterfly 3x30s Deep squat 3x30  02/19/23: Vibration plate 3x1 min in sitting with forward trunk lean Patient consented to internal pelvic floor treatment vaginally this date and found to have continued tension at Lt superficially and trigger points at Lt pubococcygeus, tightness at Lt obturator. Pt tolerated gentle stretching at bil bulbocavernosus and Lt obturator and pubococcygeus without  increased pain but did need cues minimally for relaxation but with this able to have much more mobility and decreased pain with palpation, tolerated trigger point release, gentle stretching well initially did report pain with digit insertion but decreased quickly and no continued pain.  02/26/23: Vibration plate 3x1 min in sitting with forward trunk lean Foam rolling Rt/Lt x10, gluteal x10 Childs pose 3x30s Adductor rock x10 each Deep squat 3x30s Pigeon pose 3x30s Seated butterfly 3x30s  PATIENT EDUCATION:  Education details: Insurance underwriter Person educated: Patient Education method: Explanation, Demonstration, Tactile cues, Verbal cues, and Handouts Education comprehension: verbalized understanding and returned demonstration  HOME EXERCISE PROGRAM: [email or web address]  ASSESSMENT:  CLINICAL IMPRESSION: Patient presents for treatment, session focused on relaxation technique and stretching for decreased tissue tension. Pt tolerated well with minimal cues for technique and breathing for relaxation. Pt tolerated well and denies concerns, pleased with progress so far. Progressing toward goals. Pt would benefit from additional PT to further address deficits.    OBJECTIVE IMPAIRMENTS: decreased activity tolerance, decreased coordination, decreased mobility, decreased strength, increased muscle spasms, impaired flexibility, improper body mechanics, and pain.   ACTIVITY LIMITATIONS:  intercourse/medical exams  PARTICIPATION LIMITATIONS: interpersonal relationship  PERSONAL FACTORS: Time since onset of injury/illness/exacerbation are also affecting patient's  functional outcome.   REHAB POTENTIAL: Good  CLINICAL DECISION MAKING: Stable/uncomplicated  EVALUATION COMPLEXITY: Low   GOALS: Goals reviewed with patient? Yes  SHORT TERM GOALS: Target date: 01/30/23  Pt to be I with HEP.  Baseline: Goal status: MET  2.  Pt will report no more than 6/10 pain due to improvements in posture, strength, and  muscle length for improved tolerance to vaginal penetration. Baseline:  Goal status: MET  3.  Pt to be I with relaxation techniques to decreased strain at pelvic floor and decreased pain with vaginal penetration.  Baseline:  Goal status: MET    LONG TERM GOALS: Target date: 05/05/23  Pt to be I with advanced HEP.  Baseline:  Goal status:MET  2.  Pt will report no more than 1/10 pain due to improvements in posture, strength, and muscle length for improved tolerance to vaginal penetration. Baseline:  Goal status: on going  3.  Pt to demonstrate full ROM with bil hips and trunk for decreased strain at pelvic floor.  Baseline:  Goal status: on going  4.  Pt to report no leakage with strong stress of laughter for improved QOL.  Baseline:  Goal status: on going  5.  Pt to tolerate vaginal penetration with dilator size 6 or equivalent for improved tolerance to vaginal medical exams.  Baseline:  Goal status: MET    PLAN:  PT FREQUENCY: 1x/week  PT DURATION:  8 sessions  PLANNED INTERVENTIONS: 97110-Therapeutic exercises, 97530- Therapeutic activity, 97112- Neuromuscular re-education, 97535- Self Care, 13086- Manual therapy, Patient/Family education, Taping, Dry Needling, Joint mobilization, Spinal mobilization, Scar mobilization, Cryotherapy, Moist heat, and Biofeedback  PLAN FOR NEXT SESSION: internal as needed and pt consents, relaxation techniques  Otelia Sergeant, PT, DPT 12/11/248:48 AM

## 2023-03-05 ENCOUNTER — Other Ambulatory Visit: Payer: Self-pay

## 2023-03-05 ENCOUNTER — Other Ambulatory Visit (HOSPITAL_COMMUNITY): Payer: Self-pay

## 2023-03-05 ENCOUNTER — Other Ambulatory Visit: Payer: Self-pay | Admitting: Adult Health

## 2023-03-05 DIAGNOSIS — F909 Attention-deficit hyperactivity disorder, unspecified type: Secondary | ICD-10-CM

## 2023-03-05 MED ORDER — LISDEXAMFETAMINE DIMESYLATE 50 MG PO CAPS
50.0000 mg | ORAL_CAPSULE | Freq: Every day | ORAL | 0 refills | Status: DC
  Filled 2023-03-05: qty 30, 30d supply, fill #0

## 2023-03-05 NOTE — Telephone Encounter (Signed)
Okay for refill?  

## 2023-03-06 ENCOUNTER — Other Ambulatory Visit (HOSPITAL_COMMUNITY): Payer: Self-pay

## 2023-03-06 ENCOUNTER — Ambulatory Visit: Payer: Commercial Managed Care - PPO | Admitting: Physical Therapy

## 2023-03-06 DIAGNOSIS — R279 Unspecified lack of coordination: Secondary | ICD-10-CM

## 2023-03-06 DIAGNOSIS — M6281 Muscle weakness (generalized): Secondary | ICD-10-CM | POA: Diagnosis not present

## 2023-03-06 DIAGNOSIS — M62838 Other muscle spasm: Secondary | ICD-10-CM | POA: Diagnosis not present

## 2023-03-06 NOTE — Therapy (Signed)
OUTPATIENT PHYSICAL THERAPY FEMALE PELVIC TREATMENT   Patient Name: Rosemery Goines MRN: [id number] DOB:[birth year], 27 y.o., female Today's Date: 03/06/2023  END OF SESSION:  PT End of Session - 03/06/23 0848     Visit Number 9    Date for PT Re-Evaluation 05/05/23    Authorization Type [facility] aetna    PT Start Time [phone]    PT Stop Time 0927    PT Time Calculation (min) 41 min    Activity Tolerance Patient tolerated treatment well    Behavior During Therapy [facility] for tasks assessed/performed                 Past Medical History:  Diagnosis Date   Acute appendicitis with localized peritonitis 10/28/2014   ADD (attention deficit disorder)    Family history of adverse reaction to anesthesia    unknown---pt. is adopted   History of Clostridium difficile colitis    Infectious mononucleosis 01/25/2014   no obvious complication except she complains of severe pain in her throat not responsive to ibuprofen discussed  options pain medicines steroid risk benefit    Irregular menses 11/08/2010   Knee injury 04/19/2011   Poss traumatic bursitis tendinitis knee looks stable  .    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   Ingrown toenail 03/26/2019   Left breast lump 08/12/2015   Borderline anemia 09/27/2014   Well adolescent visit 10/30/2011   Acne 10/30/2011   Pes planus 04/19/2011   ADD (attention deficit disorder)    Pes planus     PES PLANUS, 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   PAIN:  Are you having pain? no NPRS scale: 0/10 during intercourse Pain location: Internal  Pain type: pressure, achy/sore Pain description: constant   Aggravating factors: any vaginal penetration Relieving factors: removal of penetration  PRECAUTIONS: None  RED FLAGS: None   WEIGHT BEARING RESTRICTIONS: No  FALLS:  Has patient fallen in last 6 months? No  LIVING ENVIRONMENT: Lives with:  roommate Lives in: House/apartment   OCCUPATION: paralegal   PLOF: Independent  PATIENT GOALS: to have less pain  PERTINENT HISTORY:  Appendix removal 2016 Sexual abuse: No  BOWEL MOVEMENT: Pain with bowel movement: No Type of bowel movement:Type (Bristol Stool Scale) 4, Frequency daily, and Strain No Fully empty rectum: Yes:   Leakage: No Pads: No Fiber supplement: No  URINATION: Pain with urination: No Fully empty bladder: Yes:   Stream: Strong Urgency: No Frequency: normal Leakage:  very minimal (drop) with heavy laugher with full bladder Pads: No  INTERCOURSE: Pain with intercourse: Initial Penetration, During Penetration, and After Intercourse Ability to have vaginal penetration:  Yes: but painful, denies dryness on average, uses condoms which helps with lubricant Climax: not painful Marinoff Scale:  0/3  PREGNANCY: Vaginal deliveries 0 Tearing No C-section deliveries 0 Currently pregnant No  PROLAPSE: None   OBJECTIVE:  Note: Objective measures were completed at Evaluation unless otherwise noted.  DIAGNOSTIC FINDINGS:    COGNITION: Overall cognitive status: Within functional limits for tasks assessed     SENSATION: Light touch: Appears intact Proprioception: Appears intact  MUSCLE LENGTH: Bil hamstrings limited by 25%  LUMBAR SPECIAL TESTS:  WFL  FUNCTIONAL TESTS:  WFL  GAIT: WFL  POSTURE: No Significant postural limitations  PELVIC ALIGNMENT:WFL  LUMBARAROM/PROM:  A/PROM A/PROM  eval  Flexion WFL  Extension WFL  Right lateral flexion WFL  Left lateral flexion WFL  Right rotation Limited by 25%  Left rotation Limited by 25%   (Blank rows = not tested)  LOWER EXTREMITY ROM:  WFL  LOWER EXTREMITY MMT:  Bil hips grossly 4+/5; knees 5/5  PALPATION:   General  no TTP or restrictions in abdomen; mild tightness at bil lumbar paraspinals                External Perineal Exam TTP at Rt bulbocavernosus, dryness noted mildly, decreased mobility at clitoral hood                             Internal Pelvic Floor TTP noted in superficial and deep layers bil however more tension and TTP at superficial layer. Trigger points noted at bil bulbocavernosus, very limited mobility at perineal body with TTP.   Patient confirms identification and approves PT to assess internal pelvic floor and treatment Yes No emotional/communication barriers or cognitive limitation. Patient is motivated to learn. Patient understands and agrees with treatment goals and plan. PT explains patient will be examined in standing, sitting, and lying down to see how their muscles and joints work. When they are ready, they will be asked to remove their underwear so PT can examine their perineum. The patient is also given the option of providing their own chaperone as one is not provided in  our facility. The patient also has the right and is explained the right to defer or refuse any part of the evaluation or treatment including the internal exam. With the patient's consent, PT will use one gloved finger to gently assess the muscles of  the pelvic floor, seeing how well it contracts and relaxes and if there is muscle symmetry. After, the patient will get dressed and PT and patient will discuss exam findings and plan of care. PT and patient discuss plan of care, schedule, attendance policy and HEP activities.  PELVIC MMT:   MMT eval  Vaginal 4/5 with cues for mechanics; initially 3/5  Internal Anal Sphincter   External Anal Sphincter   Puborectalis   Diastasis Recti   (Blank rows = not tested)        TONE: Slightly increased   PROLAPSE: Not seen in hooklying   TODAY'S TREATMENT:                                                                                                                              DATE:  02/19/23: Vibration plate 3x1 min in sitting with forward trunk lean Patient consented to internal pelvic floor treatment vaginally this date and found to have continued tension at Lt superficially and trigger points at Lt pubococcygeus, tightness at Lt obturator. Pt tolerated gentle stretching at bil bulbocavernosus and Lt obturator and pubococcygeus without increased pain but did need cues minimally for relaxation but with this able to have much more mobility and decreased pain with palpation, tolerated trigger point release, gentle stretching well initially did report pain with digit insertion but decreased quickly and no continued pain.  02/26/23: Vibration plate 3x1 min in sitting with forward trunk lean Foam rolling Rt/Lt x10, gluteal x10 Marjo Bicker pose 3x30s Adductor rock x10 each Deep squat 3x30s Pigeon pose 3x30s Seated butterfly 3x30s  03/06/23: Vibration plate 3x1 min in sitting with forward trunk lean Foam rolling - x10 glutes/hamstrings, adductors,  abductors Adductor rocks 2x10 each Pigeon pose 3x30s Happy baby 3x30s Seated butterfly 3x30s each Deep squat 3x30 All exercises cued for diaphragmatic breathing and relaxation at pelvic floor throughout   PATIENT EDUCATION:  Education details: Insurance underwriter Person educated: Patient Education method: Explanation, Demonstration, Tactile cues, Verbal cues, and Handouts Education comprehension: verbalized understanding and returned demonstration  HOME EXERCISE PROGRAM: [id number]  ASSESSMENT:  CLINICAL IMPRESSION: Patient presents for treatment, session focused on relaxation technique and stretching for decreased tissue tension. Pt tolerated well with minimal cues for technique and breathing for relaxation. Pt tolerated well and denies concerns, pleased with progress so far. Progressing toward goals. Plans to have one additional visit with frequency of next spaced out for 1 month to self monitor progress. If pt has maintained progress she is very comfortable with discharge.   OBJECTIVE IMPAIRMENTS: decreased activity tolerance, decreased coordination, decreased mobility, decreased strength, increased muscle spasms, impaired flexibility, improper body mechanics, and pain.   ACTIVITY LIMITATIONS:  intercourse/medical exams  PARTICIPATION LIMITATIONS: interpersonal relationship  PERSONAL FACTORS: Time since onset of injury/illness/exacerbation are also affecting patient's functional outcome.   REHAB POTENTIAL: Good  CLINICAL DECISION MAKING: Stable/uncomplicated  EVALUATION COMPLEXITY: Low   GOALS: Goals reviewed with patient? Yes  SHORT TERM GOALS: Target date:  01/30/23  Pt to be I with HEP.  Baseline: Goal status: MET  2.  Pt will report no more than 6/10 pain due to improvements in posture, strength, and muscle length for improved tolerance to vaginal penetration. Baseline:  Goal status: MET  3.  Pt to be I with relaxation techniques to decreased strain at pelvic floor and  decreased pain with vaginal penetration.  Baseline:  Goal status: MET    LONG TERM GOALS: Target date: 05/05/23  Pt to be I with advanced HEP.  Baseline:  Goal status:MET  2.  Pt will report no more than 1/10 pain due to improvements in posture, strength, and muscle length for improved tolerance to vaginal penetration. Baseline:  Goal status: MET  3.  Pt to demonstrate full ROM with bil hips and trunk for decreased strain at pelvic floor.  Baseline:  Goal status: on going  4.  Pt to report no leakage with strong stress of laughter for improved QOL.  Baseline:  Goal status: MET  5.  Pt to tolerate vaginal penetration with dilator size 6 or equivalent for improved tolerance to vaginal medical exams.  Baseline:  Goal status: MET    PLAN:  PT FREQUENCY: 1x/week  PT DURATION:  8 sessions  PLANNED INTERVENTIONS: 97110-Therapeutic exercises, 97530- Therapeutic activity, 97112- Neuromuscular re-education, 97535- Self Care, 11914- Manual therapy, Patient/Family education, Taping, Dry Needling, Joint mobilization, Spinal mobilization, Scar mobilization, Cryotherapy, Moist heat, and Biofeedback  PLAN FOR NEXT SESSION: internal as needed and pt consents, relaxation techniques  Otelia Sergeant, PT, DPT 12/19/249:34 AM

## 2023-03-25 DIAGNOSIS — F902 Attention-deficit hyperactivity disorder, combined type: Secondary | ICD-10-CM | POA: Diagnosis not present

## 2023-03-26 ENCOUNTER — Ambulatory Visit: Payer: Commercial Managed Care - PPO | Admitting: Physical Therapy

## 2023-04-02 ENCOUNTER — Encounter: Payer: Commercial Managed Care - PPO | Admitting: Physical Therapy

## 2023-04-04 ENCOUNTER — Other Ambulatory Visit (HOSPITAL_COMMUNITY): Payer: Self-pay

## 2023-04-04 DIAGNOSIS — Z113 Encounter for screening for infections with a predominantly sexual mode of transmission: Secondary | ICD-10-CM | POA: Diagnosis not present

## 2023-04-04 DIAGNOSIS — N9089 Other specified noninflammatory disorders of vulva and perineum: Secondary | ICD-10-CM | POA: Diagnosis not present

## 2023-04-04 MED ORDER — TRIAMCINOLONE ACETONIDE 0.1 % EX OINT
TOPICAL_OINTMENT | CUTANEOUS | 0 refills | Status: DC
  Filled 2023-04-04: qty 30, 14d supply, fill #0

## 2023-04-09 ENCOUNTER — Encounter: Payer: Commercial Managed Care - PPO | Admitting: Physical Therapy

## 2023-04-10 ENCOUNTER — Ambulatory Visit: Payer: Commercial Managed Care - PPO | Admitting: Adult Health

## 2023-04-10 ENCOUNTER — Other Ambulatory Visit (HOSPITAL_COMMUNITY): Payer: Self-pay

## 2023-04-10 ENCOUNTER — Encounter: Payer: Self-pay | Admitting: Adult Health

## 2023-04-10 VITALS — BP 110/82 | HR 82 | Temp 98.1°F | Ht 67.0 in | Wt 156.0 lb

## 2023-04-10 DIAGNOSIS — H6993 Unspecified Eustachian tube disorder, bilateral: Secondary | ICD-10-CM | POA: Diagnosis not present

## 2023-04-10 DIAGNOSIS — R6889 Other general symptoms and signs: Secondary | ICD-10-CM

## 2023-04-10 LAB — POCT INFLUENZA A/B
Influenza A, POC: NEGATIVE
Influenza B, POC: NEGATIVE

## 2023-04-10 MED ORDER — PREDNISONE 10 MG PO TABS
10.0000 mg | ORAL_TABLET | Freq: Every day | ORAL | 0 refills | Status: DC
  Filled 2023-04-10: qty 7, 7d supply, fill #0

## 2023-04-10 NOTE — Progress Notes (Signed)
Subjective:    Patient ID: Lisa Hines, female    DOB: 1995/07/06, 28 y.o.   MRN: [id number]  HPI  28 year old female who  has a past medical history of Acute appendicitis with localized peritonitis (10/28/2014), ADD (attention deficit disorder), Family history of adverse reaction to anesthesia, History of Clostridium difficile colitis, Infectious mononucleosis (01/25/2014), Irregular menses (11/08/2010), Knee injury (04/19/2011), Myopia, Pes planus, and Tonsillar hypertrophy.  She presents to the office today for an acute issue.  She reports that over the last 3 days she has had dizziness and the feeling of ear congestion.  She denies fevers, chills, shortness of breath, congestion, or sinus pain/pressure.  Dizziness does not last long.  Does not happen all the time nor with necessarily with changing positions.  She has not had any falls.  Dizziness lasts just a few moments and then resolves.   Review of Systems See HPI   Past Medical History:  Diagnosis Date   Acute appendicitis with localized peritonitis 10/28/2014   ADD (attention deficit disorder)    Family history of adverse reaction to anesthesia    unknown---pt. is adopted   History of Clostridium difficile colitis    Infectious mononucleosis 01/25/2014   no obvious complication except she complains of severe pain in her throat not responsive to ibuprofen discussed  options pain medicines steroid risk benefit    Irregular menses 11/08/2010   Knee injury 04/19/2011   Poss traumatic bursitis tendinitis knee looks stable  .    Myopia    Pes planus    consult [facility]   Tonsillar hypertrophy     Social History   Socioeconomic History   Marital status: Single    Spouse name: Not on file   Number of children: Not on file   Years of education: Not on file   Highest education level: Bachelor's degree (e.g., BA, AB, BS)  Occupational History   Not on file  Tobacco Use   Smoking status: Never   Smokeless tobacco: Never   Vaping Use   Vaping status: Every Day  Substance and Sexual Activity   Alcohol use: Yes    Alcohol/week: 0.0 standard drinks of alcohol    Comment: ocassionally    Drug use: No   Sexual activity: Not on file  Other Topics Concern   Not on file  Social History Narrative   Caretaker verifies today that the child's current immunizations are up to date.   Child is adopted   Sib adopted   Active at school gymnastics, swimming, tennis and horseback riding seasonal now volleyball   Sleep about 9 hours or so   AL program   [employer]   ib courses   Field hockey diving and lacrosse in past    Intact family      Social Drivers of Corporate investment banker Strain: Low Risk  (08/22/2021)   Overall Financial Resource Strain (CARDIA)    Difficulty of Paying Living Expenses: Not very hard  Food Insecurity: No Food Insecurity (08/22/2021)   Hunger Vital Sign    Worried About Running Out of Food in the Last Year: Never true    Ran Out of Food in the Last Year: Never true  Transportation Needs: No Transportation Needs (08/22/2021)   PRAPARE - Administrator, Civil Service (Medical): No    Lack of Transportation (Non-Medical): No  Physical Activity: Insufficiently Active (08/22/2021)   Exercise Vital Sign    Days of Exercise per  Week: 3 days    Minutes of Exercise per Session: 30 min  Stress: Stress Concern Present (08/22/2021)   [employer] of Occupational Health - Occupational Stress Questionnaire    Feeling of Stress : To some extent  Social Connections: Socially Isolated (08/22/2021)   Social Connection and Isolation Panel [NHANES]    Frequency of Communication with Friends and Family: More than three times a week    Frequency of Social Gatherings with Friends and Family: More than three times a week    Attends Religious Services: Never    Database administrator or Organizations: No    Attends Engineer, structural: Not on file    Marital Status: Never married   Intimate Partner Violence: Not on file    Past Surgical History:  Procedure Laterality Date   APPENDECTOMY     LAPAROSCOPIC APPENDECTOMY N/A 10/28/2014   Procedure: APPENDECTOMY LAPAROSCOPIC;  Surgeon: Glenna Fellows, MD;  Location: [facility];  Service: General;  Laterality: N/A;   MYRINGOTOMY     TONSILLECTOMY Bilateral 03/19/2017   Procedure: TONSILLECTOMY;  Surgeon: Suzanna Obey, MD;  Location: [facility];  Service: ENT;  Laterality: Bilateral;    Family History  Adopted: Yes  Problem Relation Age of Onset   ADD / ADHD Brother        possible ld    Allergies  Allergen Reactions   Clindamycin Other (See Comments)    cdiff colitis   Amoxicillin-Pot Clavulanate Rash   Penicillin G Rash    Current Outpatient Medications on File Prior to Visit  Medication Sig Dispense Refill   buPROPion (WELLBUTRIN XL) 300 MG 24 hr tablet Take 1 tablet (300 mg total) by mouth daily. 90 tablet 1   drospirenone-ethinyl estradiol (JASMIEL) 3-0.02 MG tablet TAKE 1 TABLET BY MOUTH EVERY DAY -TAKE CONTINUOUSLY SKIP PLACEBOS 112 tablet 3   fluconazole (DIFLUCAN) 150 MG tablet Take one tablet today and if needed take second tablet in 3 days 2 tablet 0   lamoTRIgine (LAMICTAL) 100 MG tablet Take 1 tablet (100 mg total) by mouth daily. 90 tablet 1   lisdexamfetamine (VYVANSE) 50 MG capsule Take 1 capsule (50 mg total) by mouth daily. 30 capsule 0   lisdexamfetamine (VYVANSE) 50 MG capsule Take 1 capsule (50 mg total) by mouth daily. 30 capsule 0   lisdexamfetamine (VYVANSE) 50 MG capsule Take 1 capsule (50 mg total) by mouth daily. 30 capsule 0   omeprazole (PRILOSEC) 40 MG capsule Take 1 capsule (40 mg total) by mouth daily. 90 capsule 1   ondansetron (ZOFRAN) 4 MG tablet Take 1 tablet by mouth every 8 hours as needed for nausea or vomiting. 20 tablet 2   triamcinolone ointment (KENALOG) 0.1 % Apply a thin layer to the affected area 2 times a day. 30 g 0   No current facility-administered  medications on file prior to visit.    BP 110/82   Pulse 82   Temp 98.1 F (36.7 C) (Oral)   Ht 5\' 7"  (1.702 m)   Wt 156 lb (70.8 kg)   SpO2 98%   BMI 24.43 kg/m       Objective:   Physical Exam Vitals and nursing note reviewed.  Constitutional:      Appearance: Normal appearance.  HENT:     Right Ear: A middle ear effusion is present. Tympanic membrane is not erythematous or bulging.     Left Ear: A middle ear effusion is present. Tympanic membrane is not erythematous or  bulging.     Nose: Nose normal. No congestion or rhinorrhea.     Mouth/Throat:     Mouth: Mucous membranes are moist.     Pharynx: Oropharynx is clear.  Cardiovascular:     Rate and Rhythm: Normal rate and regular rhythm.     Pulses: Normal pulses.     Heart sounds: Normal heart sounds.  Pulmonary:     Effort: Pulmonary effort is normal.  Musculoskeletal:        General: Normal range of motion.  Skin:    General: Skin is warm and dry.  Neurological:     General: No focal deficit present.     Mental Status: She is oriented to person, place, and time.  Psychiatric:        Mood and Affect: Mood normal.        Behavior: Behavior normal.        Thought Content: Thought content normal.        Judgment: Judgment normal.       Assessment & Plan:  1. Dysfunction of both eustachian tubes (Primary) -Exam and symptoms consistent with eustachian tube dysfunction.  She reports that Flonase causes her to have bleeds.  Will send in prednisone 10 mg x 1 week to see if this helps with her symptoms.  Follow-up if not improving. - predniSONE (DELTASONE) 10 MG tablet; Take 1 tablet (10 mg total) by mouth daily with breakfast.  Dispense: 7 tablet; Refill: 0  2. Flu-like symptoms  - POCT Influenza A/B- negative   Shirline Frees, NP

## 2023-04-16 ENCOUNTER — Other Ambulatory Visit (HOSPITAL_COMMUNITY): Payer: Self-pay

## 2023-04-16 DIAGNOSIS — L814 Other melanin hyperpigmentation: Secondary | ICD-10-CM | POA: Diagnosis not present

## 2023-04-16 DIAGNOSIS — L578 Other skin changes due to chronic exposure to nonionizing radiation: Secondary | ICD-10-CM | POA: Diagnosis not present

## 2023-04-16 DIAGNOSIS — B958 Unspecified staphylococcus as the cause of diseases classified elsewhere: Secondary | ICD-10-CM | POA: Diagnosis not present

## 2023-04-16 DIAGNOSIS — D229 Melanocytic nevi, unspecified: Secondary | ICD-10-CM | POA: Diagnosis not present

## 2023-04-16 MED ORDER — CLINDAMYCIN PHOSPHATE 1 % EX GEL
1.0000 | Freq: Two times a day (BID) | CUTANEOUS | 0 refills | Status: DC
  Filled 2023-04-16: qty 45, 23d supply, fill #0

## 2023-04-24 ENCOUNTER — Other Ambulatory Visit: Payer: Self-pay | Admitting: Adult Health

## 2023-04-24 DIAGNOSIS — F909 Attention-deficit hyperactivity disorder, unspecified type: Secondary | ICD-10-CM

## 2023-04-24 DIAGNOSIS — F902 Attention-deficit hyperactivity disorder, combined type: Secondary | ICD-10-CM | POA: Diagnosis not present

## 2023-04-25 ENCOUNTER — Other Ambulatory Visit (HOSPITAL_COMMUNITY): Payer: Self-pay

## 2023-04-25 ENCOUNTER — Other Ambulatory Visit: Payer: Self-pay

## 2023-04-25 MED ORDER — LISDEXAMFETAMINE DIMESYLATE 50 MG PO CAPS
50.0000 mg | ORAL_CAPSULE | Freq: Every day | ORAL | 0 refills | Status: DC
  Filled 2023-04-25 – 2023-05-27 (×2): qty 30, 30d supply, fill #0

## 2023-04-25 MED ORDER — LISDEXAMFETAMINE DIMESYLATE 50 MG PO CAPS
50.0000 mg | ORAL_CAPSULE | Freq: Every day | ORAL | 0 refills | Status: DC
  Filled 2023-04-25 – 2023-07-09 (×2): qty 30, 30d supply, fill #0

## 2023-04-25 MED ORDER — LISDEXAMFETAMINE DIMESYLATE 50 MG PO CAPS
50.0000 mg | ORAL_CAPSULE | Freq: Every day | ORAL | 0 refills | Status: DC
  Filled 2023-04-25: qty 30, 30d supply, fill #0

## 2023-04-25 NOTE — Telephone Encounter (Signed)
 Okay for refill?

## 2023-04-26 ENCOUNTER — Other Ambulatory Visit (HOSPITAL_COMMUNITY): Payer: Self-pay

## 2023-05-07 ENCOUNTER — Encounter: Payer: Self-pay | Admitting: Physical Therapy

## 2023-05-07 ENCOUNTER — Ambulatory Visit: Payer: Commercial Managed Care - PPO | Attending: Obstetrics | Admitting: Physical Therapy

## 2023-05-07 DIAGNOSIS — M62838 Other muscle spasm: Secondary | ICD-10-CM | POA: Diagnosis not present

## 2023-05-07 DIAGNOSIS — M6281 Muscle weakness (generalized): Secondary | ICD-10-CM | POA: Diagnosis not present

## 2023-05-07 DIAGNOSIS — R279 Unspecified lack of coordination: Secondary | ICD-10-CM | POA: Diagnosis not present

## 2023-05-07 NOTE — Therapy (Signed)
 OUTPATIENT PHYSICAL THERAPY FEMALE PELVIC TREATMENT   Patient Name: Lisa Hines MRN: [id number] DOB:02/21/1996, 28 y.o., female Today's Date: 05/07/2023  END OF SESSION:  PT End of Session - 05/07/23 1234     Visit Number 10    Date for PT Re-Evaluation 05/05/23    Authorization Type [facility] aetna    PT Start Time 1020    PT Stop Time 1100    PT Time Calculation (min) 40 min    Activity Tolerance Patient tolerated treatment well    Behavior During Therapy [facility] for tasks assessed/performed                 Past Medical History:  Diagnosis Date   Acute appendicitis with localized peritonitis 10/28/2014   ADD (attention deficit disorder)    Family history of adverse reaction to anesthesia    unknown---pt. is adopted   History of Clostridium difficile colitis    Infectious mononucleosis 01/25/2014   no obvious complication except she complains of severe pain in her throat not responsive to ibuprofen discussed  options pain medicines steroid risk benefit    Irregular menses 11/08/2010   Knee injury 04/19/2011   Poss traumatic bursitis tendinitis knee looks stable  .    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   Ingrown toenail 03/26/2019   Left breast lump 08/12/2015   Borderline anemia 09/27/2014   Well adolescent visit 10/30/2011   Acne 10/30/2011   Pes planus 04/19/2011   ADD (attention deficit disorder)    Pes planus     PES PLANUS, 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   OTHER NONSPECIFIC FINDING EXAMINATION OF URINE 03/02/2007    PCP: Shirline Frees, NP  REFERRING PROVIDER: Marlow Baars, MD  REFERRING DIAG: N94.10 (ICD-10-CM) - Unspecified dyspareunia  THERAPY DIAG:  Muscle weakness (generalized)  Unspecified lack of coordination  Other muscle spasm  Rationale for Evaluation and Treatment: Rehabilitation  ONSET DATE: 2017  SUBJECTIVE:  SUBJECTIVE STATEMENT: Has been doing well, no pain and feels confident with DC today   PAIN:  Are you having pain? no  PRECAUTIONS: None  RED FLAGS: None   WEIGHT BEARING RESTRICTIONS: No  FALLS:  Has patient fallen in last 6 months? No  LIVING ENVIRONMENT: Lives with:  roommate Lives in: House/apartment   OCCUPATION: paralegal   PLOF: Independent  PATIENT GOALS: to have less pain  PERTINENT HISTORY:  Appendix removal 2016 Sexual abuse: No  BOWEL MOVEMENT: Pain with bowel movement: No Type of bowel movement:Type (Bristol Stool Scale) 4, Frequency daily, and Strain No Fully empty rectum: Yes:   Leakage: No Pads: No Fiber supplement: No  URINATION: Pain with urination: No Fully empty bladder: Yes:   Stream: Strong Urgency: No Frequency: normal Leakage:  very minimal (drop) with heavy laugher with full bladder Pads: No  INTERCOURSE: Pain with intercourse: Initial Penetration, During Penetration, and After Intercourse Ability to have vaginal penetration:  Yes: but painful, denies dryness on average, uses condoms which helps with lubricant Climax: not painful Marinoff Scale: 0/3  PREGNANCY: Vaginal deliveries 0 Tearing No C-section deliveries 0 Currently pregnant No  PROLAPSE: None   OBJECTIVE:  Note: Objective measures were completed at Evaluation  unless otherwise noted.  DIAGNOSTIC FINDINGS:    COGNITION: Overall cognitive status: Within functional limits for tasks assessed     SENSATION: Light touch: Appears intact Proprioception: Appears intact  MUSCLE LENGTH: Bil hamstrings limited by 25%  LUMBAR SPECIAL TESTS:  WFL  FUNCTIONAL TESTS:  WFL  GAIT: WFL  POSTURE: No Significant postural limitations  PELVIC ALIGNMENT:WFL  LUMBARAROM/PROM:  A/PROM A/PROM  eval  Flexion WFL  Extension WFL  Right lateral flexion WFL  Left lateral flexion WFL  Right rotation Limited by 25%  Left rotation Limited by 25%   (Blank rows = not tested)  LOWER EXTREMITY ROM:  WFL  LOWER EXTREMITY MMT:  Bil hips grossly 4+/5; knees 5/5  PALPATION:   General  no TTP or restrictions in abdomen; mild tightness at bil lumbar paraspinals                External Perineal Exam TTP at Rt bulbocavernosus, dryness noted mildly, decreased mobility at clitoral hood                             Internal Pelvic Floor TTP noted in superficial and deep layers bil however more tension and TTP at superficial layer. Trigger points noted at bil bulbocavernosus, very limited mobility at perineal body with TTP.   Patient confirms identification and approves PT to assess internal pelvic floor and treatment Yes No emotional/communication barriers or cognitive limitation. Patient is motivated to learn. Patient understands and agrees with treatment goals and plan. PT explains patient will be examined in standing, sitting, and lying down to see how their muscles and joints work. When they are ready, they will be asked to remove their underwear so PT can examine their perineum. The patient is also given the option of providing their own chaperone as one is not provided in our facility. The patient also has the right and is explained the right to defer or refuse any part of the evaluation or treatment including the internal exam. With the patient's consent, PT  will use one gloved finger to gently assess the muscles of the pelvic floor, seeing how well it contracts and relaxes and if there is muscle symmetry. After, the patient will get  dressed and PT and patient will discuss exam findings and plan of care. PT and patient discuss plan of care, schedule, attendance policy and HEP activities.  PELVIC MMT:   MMT eval  Vaginal 4/5 with cues for mechanics; initially 3/5  Internal Anal Sphincter   External Anal Sphincter   Puborectalis   Diastasis Recti   (Blank rows = not tested)        TONE: Slightly increased   PROLAPSE: Not seen in hooklying   TODAY'S TREATMENT:                                                                                                                              DATE:  05/07/23  Reviewed progress and all goals. Reviewed HEP and relaxation techniques to limit risk of return or pain with pelvic tension. Pt able to verbally discuss all recommendations that she has been following and denied questions or concerns.  Additional time spent on recourses for continued pt  goals of increasing activity levels without increasing pain. And PT encouraged pt to continue activity and base off symptoms, may need to implement stretching with workouts to decreased tension returning. Pt agreed.  Pt demonstrated full range of mobility with spine and hips with brief screen trunk flexion/lateral flexion/extension, full squat and leg circles. No restrictions or pain.   PATIENT EDUCATION:  Education details: Insurance underwriter Person educated: Patient Education method: Explanation, Demonstration, Tactile cues, Verbal cues, and Handouts Education comprehension: verbalized understanding and returned demonstration  HOME EXERCISE PROGRAM: [id number]  ASSESSMENT:  CLINICAL IMPRESSION: Pt has met all goals, denies concerns or further needs for PT. She is very pleased with progress and agreeable to DC today. Pt understands she will need a new referral for any  future PT needs. This will serve as pt's DC from PT at end of session.   OBJECTIVE IMPAIRMENTS: decreased activity tolerance, decreased coordination, decreased mobility, decreased strength, increased muscle spasms, impaired flexibility, improper body mechanics, and pain.   ACTIVITY LIMITATIONS:  intercourse/medical exams  PARTICIPATION LIMITATIONS: interpersonal relationship  PERSONAL FACTORS: Time since onset of injury/illness/exacerbation are also affecting patient's functional outcome.   REHAB POTENTIAL: Good  CLINICAL DECISION MAKING: Stable/uncomplicated  EVALUATION COMPLEXITY: Low   GOALS: Goals reviewed with patient? Yes  SHORT TERM GOALS: Target date: 01/30/23  Pt to be I with HEP.  Baseline: Goal status: MET  2.  Pt will report no more than 6/10 pain due to improvements in posture, strength, and muscle length for improved tolerance to vaginal penetration. Baseline:  Goal status: MET  3.  Pt to be I with relaxation techniques to decreased strain at pelvic floor and decreased pain with vaginal penetration.  Baseline:  Goal status: MET    LONG TERM GOALS: Target date: 05/05/23  Pt to be I with advanced HEP.  Baseline:  Goal status:MET  2.  Pt will report no more than 1/10 pain due to improvements in posture, strength, and muscle length for improved  tolerance to vaginal penetration. Baseline:  Goal status: MET  3.  Pt to demonstrate full ROM with bil hips and trunk for decreased strain at pelvic floor.  Baseline:  Goal status: MET  4.  Pt to report no leakage with strong stress of laughter for improved QOL.  Baseline:  Goal status: MET  5.  Pt to tolerate vaginal penetration with dilator size 6 or equivalent for improved tolerance to vaginal medical exams.  Baseline:  Goal status: MET    PLAN:  PT FREQUENCY: 1x/week  PT DURATION:  8 sessions  PLANNED INTERVENTIONS: 97110-Therapeutic exercises, 97530- Therapeutic activity, 97112- Neuromuscular  re-education, 97535- Self Care, 16109- Manual therapy, Patient/Family education, Taping, Dry Needling, Joint mobilization, Spinal mobilization, Scar mobilization, Cryotherapy, Moist heat, and Biofeedback  PLAN FOR NEXT SESSION:   PHYSICAL THERAPY DISCHARGE SUMMARY  Visits from Start of Care: 10  Current functional level related to goals / functional outcomes: All goals met   Remaining deficits: All goals met   Education / Equipment: HEP   Patient agrees to discharge. Patient goals were met. Patient is being discharged due to meeting the stated rehab goals.  Otelia Sergeant, PT, DPT 05/06/2510:40 PM

## 2023-05-23 DIAGNOSIS — F902 Attention-deficit hyperactivity disorder, combined type: Secondary | ICD-10-CM | POA: Diagnosis not present

## 2023-05-27 ENCOUNTER — Other Ambulatory Visit (HOSPITAL_COMMUNITY): Payer: Self-pay

## 2023-05-27 ENCOUNTER — Other Ambulatory Visit: Payer: Self-pay | Admitting: Adult Health

## 2023-05-27 ENCOUNTER — Other Ambulatory Visit: Payer: Self-pay

## 2023-05-27 MED ORDER — FLUCONAZOLE 150 MG PO TABS
ORAL_TABLET | ORAL | 0 refills | Status: DC
  Filled 2023-05-27: qty 2, 3d supply, fill #0

## 2023-05-30 ENCOUNTER — Other Ambulatory Visit: Payer: Self-pay | Admitting: Adult Health

## 2023-05-30 ENCOUNTER — Other Ambulatory Visit (HOSPITAL_COMMUNITY): Payer: Self-pay

## 2023-05-30 DIAGNOSIS — H6993 Unspecified Eustachian tube disorder, bilateral: Secondary | ICD-10-CM

## 2023-05-30 MED ORDER — PREDNISONE 10 MG PO TABS
10.0000 mg | ORAL_TABLET | Freq: Every day | ORAL | 0 refills | Status: DC
  Filled 2023-05-30: qty 7, 7d supply, fill #0

## 2023-05-30 NOTE — Telephone Encounter (Signed)
 Okay for refill? Pt stated that she needs this

## 2023-06-01 ENCOUNTER — Other Ambulatory Visit: Payer: Self-pay | Admitting: Adult Health

## 2023-06-01 DIAGNOSIS — H6993 Unspecified Eustachian tube disorder, bilateral: Secondary | ICD-10-CM

## 2023-06-04 ENCOUNTER — Encounter: Payer: Self-pay | Admitting: Adult Health

## 2023-06-06 ENCOUNTER — Ambulatory Visit: Admitting: Adult Health

## 2023-06-06 ENCOUNTER — Encounter: Payer: Self-pay | Admitting: Adult Health

## 2023-06-06 ENCOUNTER — Other Ambulatory Visit (HOSPITAL_COMMUNITY): Payer: Self-pay

## 2023-06-06 VITALS — BP 110/80 | HR 92 | Temp 98.0°F | Ht 67.0 in | Wt 156.0 lb

## 2023-06-06 DIAGNOSIS — R300 Dysuria: Secondary | ICD-10-CM | POA: Diagnosis not present

## 2023-06-06 DIAGNOSIS — R3915 Urgency of urination: Secondary | ICD-10-CM

## 2023-06-06 LAB — POCT URINALYSIS DIPSTICK
Bilirubin, UA: NEGATIVE
Glucose, UA: NEGATIVE
Ketones, UA: NEGATIVE
Nitrite, UA: NEGATIVE
Protein, UA: POSITIVE — AB
Spec Grav, UA: >=1.03 — AB (ref 1.010–1.025)
Urobilinogen, UA: 0.2 U/dL
pH, UA: 6 (ref 5.0–8.0)

## 2023-06-06 MED ORDER — FLUCONAZOLE 150 MG PO TABS
ORAL_TABLET | ORAL | 0 refills | Status: AC
  Filled 2023-06-06: qty 2, 3d supply, fill #0

## 2023-06-06 MED ORDER — NITROFURANTOIN MONOHYD MACRO 100 MG PO CAPS
100.0000 mg | ORAL_CAPSULE | Freq: Two times a day (BID) | ORAL | 0 refills | Status: DC
  Filled 2023-06-06: qty 10, 5d supply, fill #0

## 2023-06-06 NOTE — Progress Notes (Signed)
 Subjective:    Patient ID: Lisa Hines, female    DOB: 16-Jun-1995, 28 y.o.   MRN: [id number]  HPI 28 year old female who  has a past medical history of Acute appendicitis with localized peritonitis (10/28/2014), ADD (attention deficit disorder), Family history of adverse reaction to anesthesia, History of Clostridium difficile colitis, Infectious mononucleosis (01/25/2014), Irregular menses (11/08/2010), Knee injury (04/19/2011), Myopia, Pes planus, and Tonsillar hypertrophy.  She presents to the office today for an acute visit.  She is concerned she may have a UTI. She reports urinary urgency, decreased urination and low back pain.   She denies dysuria,hematuria, lower pelvic pressure, fevers, chills, or feeling acutely ill.   No concern for UTI      Review of Systems See HPI   Past Medical History:  Diagnosis Date   Acute appendicitis with localized peritonitis 10/28/2014   ADD (attention deficit disorder)    Family history of adverse reaction to anesthesia    unknown---pt. is adopted   History of Clostridium difficile colitis    Infectious mononucleosis 01/25/2014   no obvious complication except she complains of severe pain in her throat not responsive to ibuprofen discussed  options pain medicines steroid risk benefit    Irregular menses 11/08/2010   Knee injury 04/19/2011   Poss traumatic bursitis tendinitis knee looks stable  .    Myopia    Pes planus    consult [facility]   Tonsillar hypertrophy     Social History   Socioeconomic History   Marital status: Single    Spouse name: Not on file   Number of children: Not on file   Years of education: Not on file   Highest education level: Bachelor's degree (e.g., BA, AB, BS)  Occupational History   Not on file  Tobacco Use   Smoking status: Never   Smokeless tobacco: Never  Vaping Use   Vaping status: Former   Quit date: 12/17/2022  Substance and Sexual Activity   Alcohol use: Yes    Alcohol/week: 0.0 standard  drinks of alcohol    Comment: ocassionally    Drug use: No   Sexual activity: Not on file  Other Topics Concern   Not on file  Social History Narrative   Caretaker verifies today that the child's current immunizations are up to date.   Child is adopted   Sib adopted   Active at school gymnastics, swimming, tennis and horseback riding seasonal now volleyball   Sleep about 9 hours or so   AL program   [employer]   ib courses   Field hockey diving and lacrosse in past    Intact family      Social Drivers of Corporate investment banker Strain: Low Risk  (08/22/2021)   Overall Financial Resource Strain (CARDIA)    Difficulty of Paying Living Expenses: Not very hard  Food Insecurity: No Food Insecurity (08/22/2021)   Hunger Vital Sign    Worried About Running Out of Food in the Last Year: Never true    Ran Out of Food in the Last Year: Never true  Transportation Needs: No Transportation Needs (08/22/2021)   PRAPARE - Administrator, Civil Service (Medical): No    Lack of Transportation (Non-Medical): No  Physical Activity: Insufficiently Active (08/22/2021)   Exercise Vital Sign    Days of Exercise per Week: 3 days    Minutes of Exercise per Session: 30 min  Stress: Stress Concern Present (08/22/2021)   [place]  Institute of Occupational Health - Occupational Stress Questionnaire    Feeling of Stress : To some extent  Social Connections: Socially Isolated (08/22/2021)   Social Connection and Isolation Panel [NHANES]    Frequency of Communication with Friends and Family: More than three times a week    Frequency of Social Gatherings with Friends and Family: More than three times a week    Attends Religious Services: Never    Database administrator or Organizations: No    Attends Engineer, structural: Not on file    Marital Status: Never married  Intimate Partner Violence: Not on file    Past Surgical History:  Procedure Laterality Date   APPENDECTOMY     LAPAROSCOPIC  APPENDECTOMY N/A 10/28/2014   Procedure: APPENDECTOMY LAPAROSCOPIC;  Surgeon: Glenna Fellows, MD;  Location: [facility];  Service: General;  Laterality: N/A;   MYRINGOTOMY     TONSILLECTOMY Bilateral 03/19/2017   Procedure: TONSILLECTOMY;  Surgeon: Suzanna Obey, MD;  Location: [facility];  Service: ENT;  Laterality: Bilateral;    Family History  Adopted: Yes  Problem Relation Age of Onset   ADD / ADHD Brother        possible ld    Allergies  Allergen Reactions   Clindamycin Other (See Comments)    cdiff colitis   Amoxicillin-Pot Clavulanate Rash   Penicillin G Rash    Current Outpatient Medications on File Prior to Visit  Medication Sig Dispense Refill   buPROPion (WELLBUTRIN XL) 300 MG 24 hr tablet Take 1 tablet (300 mg total) by mouth daily. 90 tablet 1   clindamycin (CLINDAGEL) 1 % gel Apply 1 Application topically 2 (two) times daily. 45 g 0   drospirenone-ethinyl estradiol (JASMIEL) 3-0.02 MG tablet TAKE 1 TABLET BY MOUTH EVERY DAY -TAKE CONTINUOUSLY SKIP PLACEBOS 112 tablet 3   lamoTRIgine (LAMICTAL) 100 MG tablet Take 1 tablet (100 mg total) by mouth daily. 90 tablet 1   lisdexamfetamine (VYVANSE) 50 MG capsule Take 1 capsule (50 mg total) by mouth daily. 30 capsule 0   lisdexamfetamine (VYVANSE) 50 MG capsule Take 1 capsule (50 mg total) by mouth daily. 30 capsule 0   lisdexamfetamine (VYVANSE) 50 MG capsule Take 1 capsule (50 mg total) by mouth daily. 30 capsule 0   omeprazole (PRILOSEC) 40 MG capsule Take 1 capsule (40 mg total) by mouth daily. 90 capsule 1   ondansetron (ZOFRAN) 4 MG tablet Take 1 tablet by mouth every 8 hours as needed for nausea or vomiting. 20 tablet 2   predniSONE (DELTASONE) 10 MG tablet Take 1 tablet (10 mg total) by mouth daily with breakfast. 7 tablet 0   triamcinolone ointment (KENALOG) 0.1 % Apply a thin layer to the affected area 2 times a day. 30 g 0   No current facility-administered medications on file prior to visit.    BP  110/80   Pulse 92   Temp 98 F (36.7 C) (Oral)   Ht 5\' 7"  (1.702 m)   Wt 156 lb (70.8 kg)   SpO2 98%   BMI 24.43 kg/m       Objective:   Physical Exam Vitals and nursing note reviewed.  Constitutional:      Appearance: Normal appearance.  Cardiovascular:     Rate and Rhythm: Normal rate and regular rhythm.     Pulses: Normal pulses.     Heart sounds: Normal heart sounds.  Pulmonary:     Effort: Pulmonary effort is normal.  Breath sounds: Normal breath sounds.  Abdominal:     Tenderness: There is abdominal tenderness in the suprapubic area. There is no right CVA tenderness or left CVA tenderness.     Hernia: No hernia is present.  Musculoskeletal:        General: Normal range of motion.  Skin:    General: Skin is warm and dry.  Neurological:     General: No focal deficit present.     Mental Status: She is alert and oriented to person, place, and time.  Psychiatric:        Mood and Affect: Mood normal.        Behavior: Behavior normal.        Thought Content: Thought content normal.        Judgment: Judgment normal.       Assessment & Plan:  1. Urinary urgency (Primary)  - nitrofurantoin, macrocrystal-monohydrate, (MACROBID) 100 MG capsule; Take 1 capsule (100 mg total) by mouth 2 (two) times daily for 10 days  Dispense: 10 capsule; Refill: 0 - fluconazole (DIFLUCAN) 150 MG tablet; Take one tablet (150 mg) by mouth today and if needed take second tablet in 3 days  Dispense: 2 tablet; Refill: 0 - Culture, Urine; Future - POC Urinalysis Dipstick + leuks, blood, and protein. Will treat due to symptoms with Macrobid  - Culture, Urine  Shirline Frees, NP

## 2023-06-07 LAB — URINE CULTURE
MICRO NUMBER:: 16231852
Result:: NO GROWTH
SPECIMEN QUALITY:: ADEQUATE

## 2023-06-10 ENCOUNTER — Encounter: Payer: Self-pay | Admitting: Adult Health

## 2023-06-10 DIAGNOSIS — F902 Attention-deficit hyperactivity disorder, combined type: Secondary | ICD-10-CM | POA: Diagnosis not present

## 2023-06-19 NOTE — Telephone Encounter (Signed)
**Note De-identified  [place] Obfuscation** Please advise 

## 2023-06-21 ENCOUNTER — Other Ambulatory Visit (HOSPITAL_COMMUNITY): Payer: Self-pay

## 2023-07-08 DIAGNOSIS — F902 Attention-deficit hyperactivity disorder, combined type: Secondary | ICD-10-CM | POA: Diagnosis not present

## 2023-07-09 ENCOUNTER — Other Ambulatory Visit: Payer: Self-pay | Admitting: Adult Health

## 2023-07-09 ENCOUNTER — Other Ambulatory Visit: Payer: Self-pay

## 2023-07-09 ENCOUNTER — Other Ambulatory Visit (HOSPITAL_COMMUNITY): Payer: Self-pay

## 2023-07-09 DIAGNOSIS — F39 Unspecified mood [affective] disorder: Secondary | ICD-10-CM

## 2023-07-09 MED ORDER — BUPROPION HCL ER (XL) 300 MG PO TB24
300.0000 mg | ORAL_TABLET | Freq: Every day | ORAL | 1 refills | Status: DC
  Filled 2023-07-09: qty 90, 90d supply, fill #0
  Filled 2023-11-03: qty 90, 90d supply, fill #1

## 2023-07-09 MED ORDER — OMEPRAZOLE 40 MG PO CPDR
40.0000 mg | DELAYED_RELEASE_CAPSULE | Freq: Every day | ORAL | 1 refills | Status: DC
  Filled 2023-07-09: qty 90, 90d supply, fill #0

## 2023-07-10 ENCOUNTER — Other Ambulatory Visit (HOSPITAL_COMMUNITY): Payer: Self-pay

## 2023-07-11 ENCOUNTER — Telehealth: Payer: Self-pay

## 2023-07-11 NOTE — Telephone Encounter (Signed)
 Copied from CRM [id number]. Topic: Clinical - Medication Question >> Jul 11, 2023  9:08 AM Dimple Francis wrote: Reason for CRM: Patient has a medical question and wants a nurse or Dr Randel Buss to call her back when available

## 2023-07-11 NOTE — Telephone Encounter (Signed)
 Called pt no answer

## 2023-07-15 NOTE — Telephone Encounter (Signed)
 Spoke to pt and she is requesting that Randel Buss take her friend as a new pt.

## 2023-07-15 NOTE — Telephone Encounter (Signed)
 Called pt no answer

## 2023-07-16 NOTE — Telephone Encounter (Signed)
 Patient notified of update  and verbalized understanding.

## 2023-07-16 NOTE — Telephone Encounter (Signed)
 Noted.

## 2023-07-31 ENCOUNTER — Telehealth: Payer: Self-pay

## 2023-07-31 ENCOUNTER — Other Ambulatory Visit (HOSPITAL_COMMUNITY): Payer: Self-pay

## 2023-07-31 ENCOUNTER — Other Ambulatory Visit: Payer: Self-pay | Admitting: Adult Health

## 2023-07-31 ENCOUNTER — Encounter: Payer: Self-pay | Admitting: Adult Health

## 2023-07-31 MED ORDER — LAMOTRIGINE 100 MG PO TABS
100.0000 mg | ORAL_TABLET | Freq: Every day | ORAL | 1 refills | Status: DC
  Filled 2023-07-31: qty 90, 90d supply, fill #0
  Filled 2023-11-03: qty 90, 90d supply, fill #1

## 2023-07-31 MED ORDER — DOXYCYCLINE HYCLATE 100 MG PO CAPS
100.0000 mg | ORAL_CAPSULE | Freq: Two times a day (BID) | ORAL | 0 refills | Status: DC
  Filled 2023-07-31: qty 20, 10d supply, fill #0

## 2023-07-31 MED ORDER — CLINDAMYCIN PHOS (ONCE-DAILY) 1 % EX GEL
1.0000 | Freq: Two times a day (BID) | CUTANEOUS | 0 refills | Status: DC
  Filled 2023-07-31: qty 75, 30d supply, fill #0

## 2023-07-31 NOTE — Telephone Encounter (Signed)
 Pharmacy Patient Advocate Encounter   Received notification from Patient Pharmacy that prior authorization for Cleocin  is required/requested.   Insurance verification completed.   The patient is insured through [facility] .   Per test claim: The current 30 day co-pay is, $35.00.  No PA needed at this time. This test claim was processed through [facility]- copay amounts may vary at other pharmacies due to pharmacy/plan contracts, or as the patient moves through the different stages of their insurance plan.

## 2023-07-31 NOTE — Telephone Encounter (Signed)
**Note De-identified  [place] Obfuscation** Please advise 

## 2023-08-05 ENCOUNTER — Other Ambulatory Visit (HOSPITAL_COMMUNITY): Payer: Self-pay

## 2023-08-05 DIAGNOSIS — F902 Attention-deficit hyperactivity disorder, combined type: Secondary | ICD-10-CM | POA: Diagnosis not present

## 2023-08-05 MED ORDER — CLINDAMYCIN PHOS (TWICE-DAILY) 1 % EX GEL
1.0000 | Freq: Two times a day (BID) | CUTANEOUS | 3 refills | Status: AC
Start: 2023-08-05 — End: ?
  Filled 2023-08-05 (×2): qty 30, 15d supply, fill #0
  Filled 2023-09-24: qty 60, 30d supply, fill #1

## 2023-08-07 ENCOUNTER — Other Ambulatory Visit (HOSPITAL_COMMUNITY): Payer: Self-pay

## 2023-08-12 ENCOUNTER — Other Ambulatory Visit: Payer: Self-pay | Admitting: Adult Health

## 2023-08-12 DIAGNOSIS — R3915 Urgency of urination: Secondary | ICD-10-CM

## 2023-08-12 NOTE — Telephone Encounter (Signed)
 Okay for refill?

## 2023-08-13 NOTE — Telephone Encounter (Signed)
 Pt declined visit and stated she has been using hydrocortisone cream and its been helping. Pt stated if it gets worse she will all back to schedule a visit.

## 2023-08-18 ENCOUNTER — Other Ambulatory Visit (HOSPITAL_COMMUNITY): Payer: Self-pay

## 2023-09-10 DIAGNOSIS — F902 Attention-deficit hyperactivity disorder, combined type: Secondary | ICD-10-CM | POA: Diagnosis not present

## 2023-09-24 ENCOUNTER — Other Ambulatory Visit: Payer: Self-pay

## 2023-09-24 ENCOUNTER — Other Ambulatory Visit: Payer: Self-pay | Admitting: Adult Health

## 2023-09-24 ENCOUNTER — Other Ambulatory Visit (HOSPITAL_COMMUNITY): Payer: Self-pay

## 2023-09-24 DIAGNOSIS — F909 Attention-deficit hyperactivity disorder, unspecified type: Secondary | ICD-10-CM

## 2023-09-24 MED ORDER — LISDEXAMFETAMINE DIMESYLATE 50 MG PO CAPS
50.0000 mg | ORAL_CAPSULE | Freq: Every day | ORAL | 0 refills | Status: DC
  Filled 2023-09-24: qty 30, 30d supply, fill #0

## 2023-09-24 MED ORDER — LISDEXAMFETAMINE DIMESYLATE 50 MG PO CAPS
50.0000 mg | ORAL_CAPSULE | Freq: Every day | ORAL | 0 refills | Status: DC
  Filled 2023-09-24 – 2023-11-04 (×2): qty 30, 30d supply, fill #0

## 2023-09-24 NOTE — Telephone Encounter (Signed)
 Okay for refill?

## 2023-10-17 DIAGNOSIS — F902 Attention-deficit hyperactivity disorder, combined type: Secondary | ICD-10-CM | POA: Diagnosis not present

## 2023-11-03 ENCOUNTER — Other Ambulatory Visit (HOSPITAL_COMMUNITY): Payer: Self-pay

## 2023-11-03 ENCOUNTER — Other Ambulatory Visit: Payer: Self-pay | Admitting: Adult Health

## 2023-11-03 DIAGNOSIS — F909 Attention-deficit hyperactivity disorder, unspecified type: Secondary | ICD-10-CM

## 2023-11-03 MED ORDER — DROSPIRENONE-ETHINYL ESTRADIOL 3-0.02 MG PO TABS
1.0000 | ORAL_TABLET | Freq: Every day | ORAL | 0 refills | Status: DC
  Filled 2023-11-03: qty 28, 21d supply, fill #0

## 2023-11-05 ENCOUNTER — Other Ambulatory Visit (HOSPITAL_COMMUNITY): Payer: Self-pay

## 2023-11-05 ENCOUNTER — Other Ambulatory Visit: Payer: Self-pay

## 2023-11-05 NOTE — Telephone Encounter (Signed)
 Okay for refill?

## 2023-11-07 ENCOUNTER — Other Ambulatory Visit (HOSPITAL_COMMUNITY): Payer: Self-pay

## 2023-11-10 ENCOUNTER — Other Ambulatory Visit (HOSPITAL_COMMUNITY): Payer: Self-pay

## 2023-11-11 ENCOUNTER — Other Ambulatory Visit (HOSPITAL_COMMUNITY): Payer: Self-pay

## 2023-11-11 DIAGNOSIS — Z124 Encounter for screening for malignant neoplasm of cervix: Secondary | ICD-10-CM | POA: Diagnosis not present

## 2023-11-11 DIAGNOSIS — Z01419 Encounter for gynecological examination (general) (routine) without abnormal findings: Secondary | ICD-10-CM | POA: Diagnosis not present

## 2023-11-11 DIAGNOSIS — Z3202 Encounter for pregnancy test, result negative: Secondary | ICD-10-CM | POA: Diagnosis not present

## 2023-11-11 MED ORDER — DROSPIRENONE-ETHINYL ESTRADIOL 3-0.02 MG PO TABS
1.0000 | ORAL_TABLET | Freq: Every day | ORAL | 3 refills | Status: AC
  Filled 2023-11-11 – 2023-11-19 (×2): qty 112, 84d supply, fill #0
  Filled 2024-04-19: qty 112, 84d supply, fill #1

## 2023-11-19 ENCOUNTER — Other Ambulatory Visit (HOSPITAL_COMMUNITY): Payer: Self-pay

## 2023-11-25 ENCOUNTER — Encounter: Payer: Self-pay | Admitting: Adult Health

## 2023-11-25 ENCOUNTER — Other Ambulatory Visit: Payer: Self-pay | Admitting: Adult Health

## 2023-11-25 NOTE — Telephone Encounter (Signed)
**Note De-identified  [place] Obfuscation** Please advise 

## 2023-11-27 DIAGNOSIS — F902 Attention-deficit hyperactivity disorder, combined type: Secondary | ICD-10-CM | POA: Diagnosis not present

## 2023-12-29 ENCOUNTER — Other Ambulatory Visit: Payer: Self-pay | Admitting: Adult Health

## 2023-12-29 ENCOUNTER — Other Ambulatory Visit: Payer: Self-pay

## 2023-12-29 DIAGNOSIS — F909 Attention-deficit hyperactivity disorder, unspecified type: Secondary | ICD-10-CM

## 2023-12-30 ENCOUNTER — Other Ambulatory Visit (HOSPITAL_COMMUNITY): Payer: Self-pay

## 2023-12-30 DIAGNOSIS — L710 Perioral dermatitis: Secondary | ICD-10-CM | POA: Diagnosis not present

## 2023-12-30 DIAGNOSIS — L249 Irritant contact dermatitis, unspecified cause: Secondary | ICD-10-CM | POA: Diagnosis not present

## 2023-12-30 MED ORDER — LISDEXAMFETAMINE DIMESYLATE 50 MG PO CAPS
50.0000 mg | ORAL_CAPSULE | Freq: Every day | ORAL | 0 refills | Status: AC
Start: 2023-12-30 — End: ?
  Filled 2023-12-30: qty 30, 30d supply, fill #0

## 2023-12-30 MED ORDER — DESONIDE 0.05 % EX CREA
1.0000 | TOPICAL_CREAM | Freq: Two times a day (BID) | CUTANEOUS | 1 refills | Status: AC
  Filled 2023-12-30: qty 30, 15d supply, fill #0

## 2023-12-30 MED ORDER — LISDEXAMFETAMINE DIMESYLATE 50 MG PO CAPS
50.0000 mg | ORAL_CAPSULE | Freq: Every day | ORAL | 0 refills | Status: DC
  Filled 2023-12-30: qty 30, 30d supply, fill #0

## 2023-12-30 MED ORDER — LISDEXAMFETAMINE DIMESYLATE 50 MG PO CAPS
50.0000 mg | ORAL_CAPSULE | Freq: Every day | ORAL | 0 refills | Status: DC
Start: 2023-12-30 — End: 2024-01-30
  Filled 2023-12-30: qty 30, 30d supply, fill #0

## 2024-01-09 DIAGNOSIS — F902 Attention-deficit hyperactivity disorder, combined type: Secondary | ICD-10-CM | POA: Diagnosis not present

## 2024-01-30 ENCOUNTER — Other Ambulatory Visit: Payer: Self-pay

## 2024-01-30 ENCOUNTER — Other Ambulatory Visit (HOSPITAL_COMMUNITY): Payer: Self-pay

## 2024-01-30 ENCOUNTER — Other Ambulatory Visit: Payer: Self-pay | Admitting: Adult Health

## 2024-01-30 DIAGNOSIS — F39 Unspecified mood [affective] disorder: Secondary | ICD-10-CM

## 2024-01-30 DIAGNOSIS — F909 Attention-deficit hyperactivity disorder, unspecified type: Secondary | ICD-10-CM

## 2024-01-30 MED ORDER — BUPROPION HCL ER (XL) 300 MG PO TB24
300.0000 mg | ORAL_TABLET | Freq: Every day | ORAL | 1 refills | Status: AC
  Filled 2024-01-30: qty 90, 90d supply, fill #0
  Filled 2024-04-19: qty 90, 90d supply, fill #1

## 2024-01-30 MED ORDER — LISDEXAMFETAMINE DIMESYLATE 50 MG PO CAPS
50.0000 mg | ORAL_CAPSULE | Freq: Every day | ORAL | 0 refills | Status: DC
  Filled 2024-01-30: qty 30, 30d supply, fill #0

## 2024-01-30 MED ORDER — LAMOTRIGINE 100 MG PO TABS
100.0000 mg | ORAL_TABLET | Freq: Every day | ORAL | 1 refills | Status: DC
  Filled 2024-01-30: qty 90, 90d supply, fill #0

## 2024-02-03 ENCOUNTER — Other Ambulatory Visit (HOSPITAL_COMMUNITY): Payer: Self-pay

## 2024-02-03 ENCOUNTER — Other Ambulatory Visit: Payer: Self-pay

## 2024-02-03 DIAGNOSIS — F3181 Bipolar II disorder: Secondary | ICD-10-CM | POA: Diagnosis not present

## 2024-02-03 DIAGNOSIS — F902 Attention-deficit hyperactivity disorder, combined type: Secondary | ICD-10-CM | POA: Diagnosis not present

## 2024-02-03 DIAGNOSIS — F411 Generalized anxiety disorder: Secondary | ICD-10-CM | POA: Diagnosis not present

## 2024-02-03 DIAGNOSIS — Z5181 Encounter for therapeutic drug level monitoring: Secondary | ICD-10-CM | POA: Diagnosis not present

## 2024-02-03 MED ORDER — LAMOTRIGINE 150 MG PO TABS
150.0000 mg | ORAL_TABLET | Freq: Every day | ORAL | 1 refills | Status: AC
  Filled 2024-02-03: qty 30, 30d supply, fill #0

## 2024-03-04 ENCOUNTER — Ambulatory Visit: Admitting: Adult Health

## 2024-03-04 ENCOUNTER — Other Ambulatory Visit (HOSPITAL_COMMUNITY): Payer: Self-pay

## 2024-03-04 VITALS — BP 100/80 | HR 66 | Temp 98.0°F | Ht 67.0 in

## 2024-03-04 DIAGNOSIS — F909 Attention-deficit hyperactivity disorder, unspecified type: Secondary | ICD-10-CM | POA: Diagnosis not present

## 2024-03-04 DIAGNOSIS — J069 Acute upper respiratory infection, unspecified: Secondary | ICD-10-CM

## 2024-03-04 DIAGNOSIS — R6889 Other general symptoms and signs: Secondary | ICD-10-CM

## 2024-03-04 LAB — POCT INFLUENZA A/B
Influenza A, POC: NEGATIVE
Influenza B, POC: NEGATIVE

## 2024-03-04 LAB — POC COVID19 BINAXNOW: SARS Coronavirus 2 Ag: NEGATIVE

## 2024-03-04 LAB — POCT RAPID STREP A (OFFICE): Rapid Strep A Screen: NEGATIVE

## 2024-03-04 MED ORDER — LISDEXAMFETAMINE DIMESYLATE 50 MG PO CAPS
50.0000 mg | ORAL_CAPSULE | Freq: Every day | ORAL | 0 refills | Status: AC
  Filled 2024-03-04: qty 30, 30d supply, fill #0

## 2024-03-04 MED ORDER — DOXYCYCLINE HYCLATE 100 MG PO CAPS
100.0000 mg | ORAL_CAPSULE | Freq: Two times a day (BID) | ORAL | 0 refills | Status: AC
  Filled 2024-03-04: qty 14, 7d supply, fill #0

## 2024-03-04 MED ORDER — FLUCONAZOLE 150 MG PO TABS
150.0000 mg | ORAL_TABLET | Freq: Every day | ORAL | 0 refills | Status: AC | PRN
  Filled 2024-03-04: qty 5, 5d supply, fill #0

## 2024-03-04 NOTE — Progress Notes (Signed)
 Subjective:    Patient ID: Lisa Hines, female    DOB: 03-18-1996, 28 y.o.   MRN: [id number]  HPI  Discussed the use of AI scribe software for clinical note transcription with the patient, who gave verbal consent to proceed.  History of Present Illness   Lisa Hines is a 28 year old female who presents with a persistent headache since Saturday.  She reports a headache that began on Saturday, improved on Tuesday, and recurred Wednesday afternoon. Pain is persistent and accompanied by a sensation of congestion in both ears,  She denies fever or chills but felt achy on Saturday. She took cyclobenzaprine  and two Advil with only partial relief.     She takes Vyvanse  50 mg daily for ADHD and feels it is effective, and she does  need a refill today.       Review of Systems See HPI   Past Medical History:  Diagnosis Date   Acute appendicitis with localized peritonitis 10/28/2014   ADD (attention deficit disorder)    Family history of adverse reaction to anesthesia    unknown---pt. is adopted   History of Clostridium difficile colitis    Infectious mononucleosis 01/25/2014   no obvious complication except she complains of severe pain in her throat not responsive to ibuprofen discussed  options pain medicines steroid risk benefit    Irregular menses 11/08/2010   Knee injury 04/19/2011   Poss traumatic bursitis tendinitis knee looks stable  .    Myopia    Pes planus    consult [facility]   Tonsillar hypertrophy     Social History   Socioeconomic History   Marital status: Single    Spouse name: Not on file   Number of children: Not on file   Years of education: Not on file   Highest education level: Bachelor's degree (e.g., BA, AB, BS)  Occupational History   Not on file  Tobacco Use   Smoking status: Never   Smokeless tobacco: Never  Vaping Use   Vaping status: Former   Quit date: 12/17/2022  Substance and Sexual Activity   Alcohol use: Yes    Alcohol/week: 0.0  standard drinks of alcohol    Comment: ocassionally    Drug use: No   Sexual activity: Not on file  Other Topics Concern   Not on file  Social History Narrative   Caretaker verifies today that the child's current immunizations are up to date.   Child is adopted   Sib adopted   Active at school gymnastics, swimming, tennis and horseback riding seasonal now volleyball   Sleep about 9 hours or so   AL program   [employer]   ib courses   Field hockey diving and lacrosse in past    Intact family      Social Drivers of Health   Tobacco Use: Low Risk (03/04/2024)   Patient History    Smoking Tobacco Use: Never    Smokeless Tobacco Use: Never    Passive Exposure: Not on file  Financial Resource Strain: Low Risk (03/04/2024)   Overall Financial Resource Strain (CARDIA)    Difficulty of Paying Living Expenses: Not very hard  Food Insecurity: No Food Insecurity (03/04/2024)   Epic    Worried About Radiation Protection Practitioner of Food in the Last Year: Never true    Ran Out of Food in the Last Year: Never true  Transportation Needs: No Transportation Needs (03/04/2024)   Epic    Lack of Transportation (Medical):  No    Lack of Transportation (Non-Medical): No  Physical Activity: Sufficiently Active (03/04/2024)   Exercise Vital Sign    Days of Exercise per Week: 5 days    Minutes of Exercise per Session: 40 min  Stress: Stress Concern Present (03/04/2024)   [employer] of Occupational Health - Occupational Stress Questionnaire    Feeling of Stress: To some extent  Social Connections: Moderately Isolated (03/04/2024)   Social Connection and Isolation Panel    Frequency of Communication with Friends and Family: More than three times a week    Frequency of Social Gatherings with Friends and Family: Twice a week    Attends Religious Services: Never    Database Administrator or Organizations: No    Attends Engineer, Structural: Not on file    Marital Status: Living with partner  Intimate  Partner Violence: Not on file  Depression (PHQ2-9): Low Risk (03/04/2024)   Depression (PHQ2-9)    PHQ-2 Score: 0  Alcohol Screen: Low Risk (03/04/2024)   Alcohol Screen    Last Alcohol Screening Score (AUDIT): 4  Housing: Unknown (03/04/2024)   Epic    Unable to Pay for Housing in the Last Year: Patient declined    Number of Times Moved in the Last Year: 1    Homeless in the Last Year: No  Utilities: Not on file  Health Literacy: Not on file    Past Surgical History:  Procedure Laterality Date   APPENDECTOMY     LAPAROSCOPIC APPENDECTOMY N/A 10/28/2014   Procedure: APPENDECTOMY LAPAROSCOPIC;  Surgeon: Morene Olives, MD;  Location: [facility];  Service: General;  Laterality: N/A;   MYRINGOTOMY     TONSILLECTOMY Bilateral 03/19/2017   Procedure: TONSILLECTOMY;  Surgeon: Roark Rush, MD;  Location: [facility];  Service: ENT;  Laterality: Bilateral;    Family History  Adopted: Yes  Problem Relation Age of Onset   ADD / ADHD Brother        possible ld    Allergies[1]  Medications Ordered Prior to Encounter[2]  BP 100/80   Pulse 66   Temp 98 F (36.7 C) (Oral)   Ht 5' 7 (1.702 m)   SpO2 99%   BMI 24.43 kg/m       Objective:   Physical Exam HENT:     Right Ear: Tympanic membrane, ear canal and external ear normal. No middle ear effusion. There is no impacted cerumen.     Left Ear: Tympanic membrane, ear canal and external ear normal.  No middle ear effusion. There is no impacted cerumen.     Nose: Nose normal. No congestion or rhinorrhea.     Right Turbinates: Not enlarged or swollen.     Left Turbinates: Not enlarged or swollen.     Right Sinus: No maxillary sinus tenderness or frontal sinus tenderness.     Left Sinus: No maxillary sinus tenderness or frontal sinus tenderness.     Mouth/Throat:     Mouth: Mucous membranes are moist.     Pharynx: Oropharynx is clear. Posterior oropharyngeal erythema present. No pharyngeal swelling or oropharyngeal  exudate.     Tonsils: 0 on the right. 0 on the left.  Cardiovascular:     Rate and Rhythm: Normal rate and regular rhythm.     Pulses: Normal pulses.     Heart sounds: Normal heart sounds.  Pulmonary:     Effort: Pulmonary effort is normal.     Breath sounds: Normal breath sounds.  Musculoskeletal:  General: Normal range of motion.  Skin:    General: Skin is warm and dry.  Neurological:     General: No focal deficit present.     Mental Status: She is oriented to person, place, and time.  Psychiatric:        Mood and Affect: Mood normal.        Behavior: Behavior normal. Behavior is cooperative.        Thought Content: Thought content normal.        Judgment: Judgment normal.        Assessment & Plan:   Assessment and Plan    Acute upper respiratory infection Likely viral etiology. Covid, Flu, and Strep negative  - Prescribed doxycycline  for potential use if symptoms persist or worsen. - Advised to start doxycycline  if no improvement in a few days.  Attention-deficit hyperactivity disorder Managed with Vyvanse  50 mg. Stable on current regimen. - Sent Vyvanse  prescription for one month.          [1]  Allergies Allergen Reactions   Clindamycin  Other (See Comments)    cdiff colitis   Amoxicillin-Pot Clavulanate Rash   Penicillin G Rash  [2]  Current Outpatient Medications on File Prior to Visit  Medication Sig Dispense Refill   buPROPion  (WELLBUTRIN  XL) 300 MG 24 hr tablet Take 1 tablet (300 mg total) by mouth daily. 90 tablet 1   clindamycin  (CLINDAGEL ) 1 % gel Apply 1 Application topically 2 (two) times daily. 45 g 3   desonide  (DESOWEN ) 0.05 % cream Apply a thin film to affected area twice a day 30 g 1   drospirenone -ethinyl estradiol  (JASMIEL ) 3-0.02 MG tablet Take 1 tablet by mouth daily. Take continously skipping placebos. 112 tablet 3   drospirenone -ethinyl estradiol  (JASMIEL ) 3-0.02 MG tablet TAKE 1 TABLET BY MOUTH EVERY DAY -TAKE CONTINUOUSLY SKIP  PLACEBOS     fluconazole  (DIFLUCAN ) 150 MG tablet Take one tablet (150 mg) by mouth today and if needed take second tablet in 3 days 2 tablet 0   lamoTRIgine  (LAMICTAL ) 150 MG tablet Take 1 tablet once a day. If rash develops or if 4 or more days of medication are missed, call the office. 30 tablet 1   lisdexamfetamine  (VYVANSE ) 50 MG capsule Take 1 capsule (50 mg total) by mouth daily. 30 capsule 0   lisdexamfetamine  (VYVANSE ) 50 MG capsule Take 1 capsule (50 mg total) by mouth daily. 30 capsule 0   lisdexamfetamine  (VYVANSE ) 50 MG capsule Take 1 capsule (50 mg total) by mouth daily. NEED OFFICE VISIT FOR FURTHER REFILLS 30 capsule 0   ondansetron  (ZOFRAN ) 4 MG tablet Take 1 tablet by mouth every 8 hours as needed for nausea or vomiting. 20 tablet 2   Semaglutide , 1 MG/DOSE, (OZEMPIC , 1 MG/DOSE,) 4 MG/3ML SOPN Inject by subcutaneous route.     No current facility-administered medications on file prior to visit.

## 2024-03-05 ENCOUNTER — Other Ambulatory Visit (HOSPITAL_COMMUNITY): Payer: Self-pay

## 2024-03-05 ENCOUNTER — Other Ambulatory Visit: Payer: Self-pay

## 2024-03-05 DIAGNOSIS — F902 Attention-deficit hyperactivity disorder, combined type: Secondary | ICD-10-CM | POA: Diagnosis not present

## 2024-03-05 DIAGNOSIS — F3181 Bipolar II disorder: Secondary | ICD-10-CM | POA: Diagnosis not present

## 2024-03-05 DIAGNOSIS — F411 Generalized anxiety disorder: Secondary | ICD-10-CM | POA: Diagnosis not present

## 2024-03-05 MED ORDER — LAMOTRIGINE 150 MG PO TABS
150.0000 mg | ORAL_TABLET | Freq: Every day | ORAL | 0 refills | Status: AC
  Filled 2024-03-05: qty 90, 90d supply, fill #0
